# Patient Record
Sex: Male | Born: 1970
Health system: Southern US, Community
[De-identification: ages and names within clinical notes are randomized; demographics above are authoritative.]

## PROBLEM LIST (undated history)

## (undated) DIAGNOSIS — M1990 Unspecified osteoarthritis, unspecified site: Secondary | ICD-10-CM

## (undated) DIAGNOSIS — E785 Hyperlipidemia, unspecified: Secondary | ICD-10-CM

## (undated) DIAGNOSIS — E739 Lactose intolerance, unspecified: Secondary | ICD-10-CM

## (undated) DIAGNOSIS — R7401 Elevation of levels of liver transaminase levels: Secondary | ICD-10-CM

## (undated) DIAGNOSIS — M2550 Pain in unspecified joint: Secondary | ICD-10-CM

## (undated) DIAGNOSIS — J302 Other seasonal allergic rhinitis: Secondary | ICD-10-CM

## (undated) DIAGNOSIS — K219 Gastro-esophageal reflux disease without esophagitis: Secondary | ICD-10-CM

## (undated) DIAGNOSIS — K760 Fatty (change of) liver, not elsewhere classified: Secondary | ICD-10-CM

## (undated) DIAGNOSIS — R740 Nonspecific elevation of levels of transaminase and lactic acid dehydrogenase [LDH]: Secondary | ICD-10-CM

## (undated) DIAGNOSIS — I10 Essential (primary) hypertension: Secondary | ICD-10-CM

## (undated) HISTORY — DX: Essential (primary) hypertension: I10

## (undated) HISTORY — DX: Pain in unspecified joint: M25.50

## (undated) HISTORY — DX: Unspecified osteoarthritis, unspecified site: M19.90

## (undated) HISTORY — DX: Elevation of levels of liver transaminase levels: R74.01

## (undated) HISTORY — DX: Fatty (change of) liver, not elsewhere classified: K76.0

## (undated) HISTORY — PX: ROTATOR CUFF REPAIR: SHX139

## (undated) HISTORY — DX: Nonspecific elevation of levels of transaminase and lactic acid dehydrogenase (ldh): R74.0

## (undated) HISTORY — DX: Gastro-esophageal reflux disease without esophagitis: K21.9

## (undated) HISTORY — PX: HERNIA REPAIR: SHX51

## (undated) HISTORY — DX: Lactose intolerance, unspecified: E73.9

## (undated) HISTORY — PX: KNEE ARTHROSCOPY: SHX127

---

## 2011-05-01 ENCOUNTER — Encounter: Payer: Self-pay | Admitting: *Deleted

## 2011-05-01 ENCOUNTER — Emergency Department
Admission: EM | Admit: 2011-05-01 | Discharge: 2011-05-01 | Disposition: A | Discharge status: Home / Self Care | Payer: BC Managed Care – PPO | Source: Home / Self Care

## 2011-05-01 DIAGNOSIS — K148 Other diseases of tongue: Secondary | ICD-10-CM

## 2011-05-01 DIAGNOSIS — H579 Unspecified disorder of eye and adnexa: Secondary | ICD-10-CM

## 2011-05-01 DIAGNOSIS — L989 Disorder of the skin and subcutaneous tissue, unspecified: Secondary | ICD-10-CM

## 2011-05-01 HISTORY — DX: Hyperlipidemia, unspecified: E78.5

## 2011-05-01 NOTE — ED Notes (Signed)
Pt c/o bump beside his LT eye and on the RT side of his tongue x 3 wks. He has applied heat with no relief. Denies fever.

## 2011-05-01 NOTE — ED Provider Notes (Signed)
History     CSN: [id number]  Arrival date & time 05/01/11  1126   None     Chief Complaint  Patient presents with  . Mass    (Consider location/radiation/quality/duration/timing/severity/associated sxs/prior treatment) HPI This patient presents with 2 problems.  The first is a bump on the medial aspect of his left eye.  It has been there for a few weeks.  No known trauma or irritation.  He used to heating compress which did help but then did not get any better after that.  No difficulty seeing.  No blurry vision no headaches.  No foreign bodies.  No pain.  Second problem is a bump on the right tong plaster weeks.  He does not recall eating anything hot or damaging it.  He states that he made good the area.  He is not a smoker.  It is sticking out to the right tongue and that is causing him to buy further.  He does report that is a little bit sore and irritated but is not bleeding and no pus.  No previous trauma to either area.  Past Medical History  Diagnosis Date  . Hyperlipidemia     Past Surgical History  Procedure Date  . Knee arthroscopy   . Hernia repair     Family History  Problem Relation Age of Onset  . Cancer Father     prostate    History  Substance Use Topics  . Smoking status: Never Smoker   . Smokeless tobacco: Not on file  . Alcohol Use: Yes      Review of Systems  All other systems reviewed and are negative.    Allergies  Review of patient's allergies indicates no known allergies.  Home Medications  No current outpatient prescriptions on file.  BP 136/86  Pulse 95  Temp(Src) 98.7 F (37.1 C) (Oral)  Resp 18  Ht 6\' 3"  (1.905 m)  Wt 249 lb 8 oz (113.172 kg)  BMI 31.19 kg/m2  SpO2 96%  Physical Exam  Nursing note and vitals reviewed. Constitutional: He is oriented to person, place, and time. He appears well-developed and well-nourished.  HENT:  Head: Normocephalic and atraumatic.  Mouth/Throat:    Eyes: No scleral icterus.     Neck: Neck supple.  Cardiovascular: Regular rhythm and normal heart sounds.   Pulmonary/Chest: Effort normal and breath sounds normal. No respiratory distress.  Neurological: He is alert and oriented to person, place, and time.  Skin: Skin is warm and dry.  Psychiatric: He has a normal mood and affect. His speech is normal.    ED Course  Procedures (including critical care time)  Labs Reviewed - No data to display No results found.   1. Eye lesion   2. Tongue lesion       MDM   This patient has 2 different problems.  The first problem is the small lump on the inner canthal area of his left eye.  It looks to be a very small cyst originating from the conjunctiva.  It does not appear to be a blocked tear duct.  It does not look malignant.  However since I'm unsure what this is, I have suggested that he do warm compresses in the meantime.  We're also going to try to get him in to see either oculoplastics or an ophthalmologist to have them look at it and consider lancing it.  In addition to that he also has a lump on the right side of his tongue there is  probably scar tissue from repeatedly biting it or from a burn.  It does not look to be cancerous.  However if the same doctor can look at this that would be helpful so we will first try to get him to see oculoplastic surgery if not then he will need to see ENT to have this evaluated and perhaps removed.  Another option would be to watch it for the next week or 2 and try to avoid biting it and see if it went away and if not then he went to see ENT at that time.  At this time I do not believe that we can do anything here because of the locations of the lesions.  Marlaine Hind, MD 05/01/11 1230

## 2013-01-01 ENCOUNTER — Emergency Department
Admission: EM | Admit: 2013-01-01 | Discharge: 2013-01-01 | Disposition: A | Discharge status: Home / Self Care | Payer: Self-pay | Source: Home / Self Care | Attending: Family Medicine | Admitting: Family Medicine

## 2013-01-01 ENCOUNTER — Encounter: Payer: Self-pay | Admitting: Emergency Medicine

## 2013-01-01 ENCOUNTER — Emergency Department
Admission: EM | Admit: 2013-01-01 | Discharge: 2013-01-01 | Disposition: A | Discharge status: Home / Self Care | Payer: BC Managed Care – PPO | Source: Home / Self Care | Attending: Family Medicine | Admitting: Family Medicine

## 2013-01-01 DIAGNOSIS — K649 Unspecified hemorrhoids: Secondary | ICD-10-CM

## 2013-01-01 MED ORDER — HYDROCORTISONE ACETATE 25 MG RE SUPP: 25.0000 mg | Freq: Two times a day (BID) | RECTAL | Status: DC

## 2013-01-01 NOTE — ED Notes (Signed)
Jerry Escobar complains of a hemorrhoid for 1 week. He has tried preparation H with little relief. Area feels swollen per patient.

## 2013-01-01 NOTE — ED Provider Notes (Signed)
CSN: [id number]     Arrival date & time 01/01/13  1427 History   First MD Initiated Contact with Patient 01/01/13 1438     Chief Complaint  Patient presents with  . Hemorrhoids    x 1 week    HPI Hemorrhoids x 1 week Has had anal and irritation Has been eating more meats and decreased fiber.  No bloody stools.   No prior hx/o hemorrhoids in the past.  Has been using OTC preparation H with mild improvement in sxs.    Past Medical History  Diagnosis Date  . Hyperlipidemia    Past Surgical History  Procedure Laterality Date  . Knee arthroscopy    . Hernia repair     Family History  Problem Relation Age of Onset  . Cancer Father     prostate   History  Substance Use Topics  . Smoking status: Never Smoker   . Smokeless tobacco: Not on file  . Alcohol Use: Yes    Review of Systems  All other systems reviewed and are negative.    Allergies  Review of patient's allergies indicates no known allergies.  Home Medications  No current outpatient prescriptions on file. BP 145/89  Pulse 78  Temp(Src) 97.9 F (36.6 C) (Oral)  Ht 6\' 3"  (1.905 m)  Wt 270 lb (122.471 kg)  BMI 33.75 kg/m2  SpO2 95% Physical Exam  Constitutional: He appears well-developed and well-nourished.  HENT:  Head: Normocephalic and atraumatic.  Eyes: Conjunctivae are normal. Pupils are equal, round, and reactive to light.  Neck: Normal range of motion. Neck supple.  Cardiovascular: Normal rate and regular rhythm.   Pulmonary/Chest: Effort normal and breath sounds normal.  Abdominal: Soft.  Genitourinary:     + 2x2 cm nonthrombosed external hemorrhoid  Neurological: He is alert.  Skin: Skin is warm.    ED Course  Procedures (including critical care time) Labs Review Labs Reviewed - No data to display Imaging Review No results found.  EKG Interpretation    Date/Time:    Ventricular Rate:    PR Interval:    QRS Duration:   QT Interval:    QTC Calculation:   R Axis:     Text  Interpretation:              MDM   1. Hemorrhoids    Will treat with anusol  Handout given Discussed high fiber diet.  Discussed general care and infectious/GI red flags Follow up as needed.    The patient and/or caregiver has been counseled thoroughly with regard to treatment plan and/or medications prescribed including dosage, schedule, interactions, rationale for use, and possible side effects and they verbalize understanding. Diagnoses and expected course of recovery discussed and will return if not improved as expected or if the condition worsens. Patient and/or caregiver verbalized understanding.         Doree Albee, MD 01/01/13 [phone]

## 2013-02-11 ENCOUNTER — Encounter: Payer: Self-pay | Admitting: Emergency Medicine

## 2013-02-11 ENCOUNTER — Emergency Department
Admission: EM | Admit: 2013-02-11 | Discharge: 2013-02-11 | Disposition: A | Discharge status: Home / Self Care | Payer: BC Managed Care – PPO | Source: Home / Self Care | Attending: Family Medicine | Admitting: Family Medicine

## 2013-02-11 DIAGNOSIS — R300 Dysuria: Secondary | ICD-10-CM

## 2013-02-11 LAB — POCT URINALYSIS DIP (MANUAL ENTRY)
Bilirubin, UA: NEGATIVE
GLUCOSE UA: NEGATIVE
Ketones, POC UA: NEGATIVE
Leukocytes, UA: NEGATIVE
Nitrite, UA: NEGATIVE
PH UA: 6 (ref 5–8)
Protein Ur, POC: NEGATIVE
Spec Grav, UA: >=1.03 (ref 1.005–1.03)
UROBILINOGEN UA: 1 (ref 0–1)

## 2013-02-11 MED ORDER — DOXYCYCLINE HYCLATE 100 MG PO CAPS: 100.0000 mg | ORAL_CAPSULE | Freq: Two times a day (BID) | ORAL | Status: DC

## 2013-02-11 NOTE — ED Provider Notes (Signed)
CSN: [id number]     Arrival date & time 02/11/13  1233 History   First MD Initiated Contact with Patient 02/11/13 1329     Chief Complaint  Patient presents with  . Dysuria     HPI Comments: Patient developed onset of dysuria yesterday without frequency or urgency.  No nocturia or urethral discharge.  No testicular pain or swelling.  No rash.  No fevers, chills, and sweats.  He feels well otherwise.  Patient is a 43 y.o. male presenting with dysuria. The history is provided by the patient.  Dysuria This is a new problem. The current episode started yesterday. The problem occurs constantly. The problem has not changed since onset.Associated symptoms comments: none. Nothing aggravates the symptoms. Nothing relieves the symptoms. He has tried nothing for the symptoms.    Past Medical History  Diagnosis Date  . Hyperlipidemia    Past Surgical History  Procedure Laterality Date  . Knee arthroscopy    . Hernia repair     Family History  Problem Relation Age of Onset  . Cancer Father     prostate   History  Substance Use Topics  . Smoking status: Never Smoker   . Smokeless tobacco: Not on file  . Alcohol Use: Yes    Review of Systems  Genitourinary: Positive for dysuria. Negative for urgency, frequency, hematuria, flank pain, decreased urine volume, discharge, scrotal swelling, difficulty urinating, genital sores, penile pain and testicular pain.  All other systems reviewed and are negative.    Allergies  Review of patient's allergies indicates no known allergies.  Home Medications   Current Outpatient Rx  Name  Route  Sig  Dispense  Refill  . doxycycline (VIBRAMYCIN) 100 MG capsule   Oral   Take 1 capsule (100 mg total) by mouth 2 (two) times daily.   14 capsule   0    BP 130/91  Pulse 86  Temp(Src) 98.6 F (37 C) (Oral)  Resp 16  Ht 6' 3"  (1.905 m)  Wt 268 lb 12 oz (121.904 kg)  BMI 33.59 kg/m2  SpO2 96% Physical Exam Nursing notes and Vital Signs  reviewed. Appearance:  Patient appears stated age, and in no acute distress.  Patient is obese (BMI 33.6) Eyes:  Pupils are equal, round, and reactive to light and accomodation.  Extraocular movement is intact.  Conjunctivae are not inflamed .  Pharynx:  Normal Neck:  Supple.  No adenopathy Lungs:  Clear to auscultation.  Breath sounds are equal.  Heart:  Regular rate and rhythm without murmurs, rubs, or gallops.  Abdomen:  Nontender without masses or hepatosplenomegaly.  Bowel sounds are present.  No CVA or flank tenderness.  Extremities:  No edema.  Skin:  No rash present.  Genitourinary:  Penis normal without lesions or urethral discharge.  Scrotum is normal.  Testes are descended bilaterally without nodules or tenderness.  No regional lymphadenopathy palpated       ED Course  Procedures  none    Labs Reviewed  URINE CULTURE  GC/CHLAMYDIA PROBE AMP, URINE  POCT URINALYSIS DIP (MANUAL ENTRY):  SG >= 1.030; BLO Trace intact, otherwise negative         MDM   1. Dysuria    GC/chlamydia and urine culture pending. Begin empiric doxycycline Increase fluid intake. Followup with Family Doctor if not improved in about 5 or 6 days.    Kandra Nicolas, MD 02/11/13 [phone]

## 2013-02-11 NOTE — ED Notes (Signed)
Patient c/o dysuria since yesterday.

## 2013-02-11 NOTE — Discharge Instructions (Signed)
Increase fluid intake.   Dysuria Dysuria is the medical term for pain with urination. There are many causes for dysuria, but urinary tract infection is the most common. If a urinalysis was performed it can show that there is a urinary tract infection. A urine culture confirms that you or your child is sick. You will need to follow up with a healthcare provider because:  If a urine culture was done you will need to know the culture results and treatment recommendations.  If the urine culture was positive, you or your child will need to be put on antibiotics or know if the antibiotics prescribed are the right antibiotics for your urinary tract infection.  If the urine culture is negative (no urinary tract infection), then other causes may need to be explored or antibiotics need to be stopped. Today laboratory work may have been done and there does not seem to be an infection. If cultures were done they will take at least 24 to 48 hours to be completed. Today x-rays may have been taken and they read as normal. No cause can be found for the problems. The x-rays may be re-read by a radiologist and you will be contacted if additional findings are made. You or your child may have been put on medications to help with this problem until you can see your primary caregiver. If the problems get better, see your primary caregiver if the problems return. If you were given antibiotics (medications which kill germs), take all of the mediations as directed for the full course of treatment.  If laboratory work was done, you need to find the results. Leave a telephone number where you can be reached. If this is not possible, make sure you find out how you are to get test results. HOME CARE INSTRUCTIONS   Drink lots of fluids. For adults, drink eight, 8 ounce glasses of clear juice or water a day. For children, replace fluids as suggested by your caregiver.  Empty the bladder often. Avoid holding urine for long  periods of time.  After a bowel movement, women should cleanse front to back, using each tissue only once.  Empty your bladder before and after sexual intercourse.  Take all the medicine given to you until it is gone. You may feel better in a few days, but TAKE ALL MEDICINE.  Avoid caffeine, tea, alcohol and carbonated beverages, because they tend to irritate the bladder.  In men, alcohol may irritate the prostate.  Only take over-the-counter or prescription medicines for pain, discomfort, or fever as directed by your caregiver.  If your caregiver has given you a follow-up appointment, it is very important to keep that appointment. Not keeping the appointment could result in a chronic or permanent injury, pain, and disability. If there is any problem keeping the appointment, you must call back to this facility for assistance. SEEK IMMEDIATE MEDICAL CARE IF:   Back pain develops.  A fever develops.  There is nausea (feeling sick to your stomach) or vomiting (throwing up).  Problems are no better with medications or are getting worse. MAKE SURE YOU:   Understand these instructions.  Will watch your condition.  Will get help right away if you are not doing well or get worse. Document Released: 10/04/2003 Document Revised: 03/30/2011 Document Reviewed: 08/11/2007 [facility] Patient Information 2014 [employer].

## 2013-02-12 LAB — URINE CULTURE
Colony Count: NO GROWTH
ORGANISM ID, BACTERIA: NO GROWTH

## 2013-02-13 LAB — GC/CHLAMYDIA PROBE AMP, URINE
Chlamydia, Swab/Urine, PCR: NEGATIVE
GC Probe Amp, Urine: NEGATIVE

## 2013-02-14 ENCOUNTER — Telehealth: Payer: Self-pay | Admitting: *Deleted

## 2013-04-09 ENCOUNTER — Encounter: Payer: Self-pay | Admitting: Emergency Medicine

## 2013-04-09 ENCOUNTER — Emergency Department
Admission: EM | Admit: 2013-04-09 | Discharge: 2013-04-09 | Disposition: A | Discharge status: Home / Self Care | Payer: BC Managed Care – PPO | Source: Home / Self Care

## 2013-04-09 DIAGNOSIS — J111 Influenza due to unidentified influenza virus with other respiratory manifestations: Secondary | ICD-10-CM

## 2013-04-09 DIAGNOSIS — J209 Acute bronchitis, unspecified: Secondary | ICD-10-CM

## 2013-04-09 MED ORDER — OSELTAMIVIR PHOSPHATE 75 MG PO CAPS
ORAL_CAPSULE | ORAL | Status: DC
Start: 2013-04-09 — End: 2013-06-29

## 2013-04-09 MED ORDER — AZITHROMYCIN 250 MG PO TABS: ORAL_TABLET | ORAL | Status: DC

## 2013-04-09 NOTE — ED Notes (Signed)
Patient c/o fever, sweats, headaches, sinus pressure and cough since yesterday. Has tried otc Dayquil and Alkaselzer cold without relief.

## 2013-04-09 NOTE — ED Provider Notes (Signed)
CSN: [id number]     Arrival date & time 04/09/13  1426 History   None    Chief Complaint  Patient presents with  . Fever  . Headache  . Sinusitis   (Consider location/radiation/quality/duration/timing/severity/associated sxs/prior Treatment)  HPI FLU  HPI : Flu symptoms for about 1 day. Fever to 102 with chills, sweats, myalgias, fatigue, headache. Symptoms are progressively worsening, despite trying OTC fever reducing medicine and rest and fluids. Has decreased appetite, but tolerating some liquids by mouth. No history of recent tick bite.  Review of Systems: Positive for fatigue, mild nasal congestion, mild sore throat, mild swollen anterior neck glands, + cough and chest congestion, cough productive of discolored sputum Negative for acute vision changes, stiff neck, focal weakness, syncope, seizures, respiratory distress, vomiting, diarrhea, GU symptoms, new Rash.  Past Medical History  Diagnosis Date  . Hyperlipidemia    Past Surgical History  Procedure Laterality Date  . Knee arthroscopy    . Hernia repair     Family History  Problem Relation Age of Onset  . Cancer Father     prostate   History  Substance Use Topics  . Smoking status: Never Smoker   . Smokeless tobacco: Not on file  . Alcohol Use: Yes    Review of Systems  All other systems reviewed and are negative.    Allergies  Review of patient's allergies indicates no known allergies.  Home Medications   Current Outpatient Rx  Name  Route  Sig  Dispense  Refill  . azithromycin (ZITHROMAX Z-PAK) 250 MG tablet      Take 2 tablets on day one, then 1 tablet daily on days 2 through 5   1 each   0   . doxycycline (VIBRAMYCIN) 100 MG capsule   Oral   Take 1 capsule (100 mg total) by mouth 2 (two) times daily.   14 capsule   0   . oseltamivir (TAMIFLU) 75 MG capsule      Starting today, take 1 capsule by mouth twice a day for 5 days.   10 capsule   0    BP 151/91  Pulse 92  Temp(Src) 98.5 F  (36.9 C) (Oral)  Resp 18  Ht 6' 3"  (1.905 m)  Wt 262 lb (118.842 kg)  BMI 32.75 kg/m2  SpO2 97% Physical Exam  Nursing note and vitals reviewed. Constitutional: He appears well-developed and well-nourished.  Non-toxic appearance. He appears ill (very fatigued, but no cardiorespiratory distress). No distress.  HENT:  Head: Normocephalic and atraumatic.  Right Ear: Tympanic membrane and external ear normal.  Left Ear: Tympanic membrane and external ear normal.  Nose: Rhinorrhea present.  Mouth/Throat: Mucous membranes are normal. Posterior oropharyngeal erythema (mild redness ) present. No oropharyngeal exudate.  Eyes: Conjunctivae are normal. Right eye exhibits no discharge. Left eye exhibits no discharge. No scleral icterus.  Neck: Neck supple.  Cardiovascular: Normal rate, regular rhythm and normal heart sounds.   Pulmonary/Chest: No stridor. No respiratory distress. He has no wheezes. He has no rales.  Diffuse anterior rhonchi. No wheezes  Abdominal: Soft. There is no tenderness.  Musculoskeletal: He exhibits no edema.  Lymphadenopathy:    He has cervical adenopathy (mild shoddy anterior cervical nodes).  Neurological: He is alert.  Skin: Skin is warm and intact. No rash noted. He is diaphoretic.  Psychiatric: He has a normal mood and affect.    ED Course  Procedures (including critical care time) Labs Review Labs Reviewed - No data to display Imaging  Review No results found.   MDM   1. Influenza with respiratory manifestations   2. Acute bronchitis    Clinically, he has classical influenza. Discussed options and he declined flu testing as we both agreed to start Tamiflu Also, clinically he has acute bronchitis with rhonchi and we'll cover with antibiotic for bacterial coverage. Treatment options discussed, as well as risks, benefits, alternatives. Patient voiced understanding and agreement with the following plans: Tamiflu Z-Pak Follow-up with your primary care  doctor in 5-7 days if not improving, or sooner if symptoms become worse. Precautions discussed. Red flags discussed. Questions invited and answered. Patient voiced understanding and agreement.      Jacqulyn Cane, MD 04/09/13 1550

## 2013-06-29 ENCOUNTER — Encounter: Payer: Self-pay | Admitting: Emergency Medicine

## 2013-06-29 ENCOUNTER — Emergency Department
Admission: EM | Admit: 2013-06-29 | Discharge: 2013-06-29 | Disposition: A | Discharge status: Home / Self Care | Payer: BC Managed Care – PPO | Source: Home / Self Care | Attending: Emergency Medicine | Admitting: Emergency Medicine

## 2013-06-29 DIAGNOSIS — H6120 Impacted cerumen, unspecified ear: Secondary | ICD-10-CM

## 2013-06-29 DIAGNOSIS — H6121 Impacted cerumen, right ear: Secondary | ICD-10-CM

## 2013-06-29 NOTE — ED Notes (Signed)
Reports sensation of fullness in right ear for about one week. Denies pain.

## 2013-06-29 NOTE — ED Provider Notes (Signed)
CSN: [id number]     Arrival date & time 06/29/13  2694 History   First MD Initiated Contact with Patient 06/29/13 0827     Chief Complaint  Patient presents with  . Ear Fullness   (Consider location/radiation/quality/duration/timing/severity/associated sxs/prior Treatment) HPI Sensation of fullness in right ear for one week. This started after using Q-tips repeatedly. He has since tried using eardrops without success. Otherwise, denies any ENT symptoms. No sore throat or sinus symptoms or fever or rhinorrhea. No cough or chest pain or shortness of breath. Past Medical History  Diagnosis Date  . Hyperlipidemia    Past Surgical History  Procedure Laterality Date  . Knee arthroscopy    . Hernia repair     Family History  Problem Relation Age of Onset  . Cancer Father     prostate   History  Substance Use Topics  . Smoking status: Never Smoker   . Smokeless tobacco: Not on file  . Alcohol Use: Yes    Review of Systems  All other systems reviewed and are negative.   Allergies  Review of patient's allergies indicates no known allergies.  Home Medications   Prior to Admission medications   Medication Sig Start Date End Date Taking? Authorizing Provider  azithromycin (ZITHROMAX Z-PAK) 250 MG tablet Take 2 tablets on day one, then 1 tablet daily on days 2 through 5 04/09/13   Jacqulyn Cane, MD  doxycycline (VIBRAMYCIN) 100 MG capsule Take 1 capsule (100 mg total) by mouth 2 (two) times daily. 02/11/13   Kandra Nicolas, MD  oseltamivir (TAMIFLU) 75 MG capsule Starting today, take 1 capsule by mouth twice a day for 5 days. 04/09/13   Jacqulyn Cane, MD   BP 131/86  Pulse 82  Temp(Src) 98 F (36.7 C) (Oral)  Resp 16  Ht 6' 3"  (1.905 m)  Wt 267 lb (121.11 kg)  BMI 33.37 kg/m2  SpO2 98% Physical Exam  Nursing note and vitals reviewed. Constitutional: He is oriented to person, place, and time. He appears well-developed and well-nourished. No distress.  HENT:  Head:  Normocephalic and atraumatic.  Nose: Nose normal.  Mouth/Throat: Oropharynx is clear and moist.  Ears: Left ear normal  Right ear: Nontender. There is a dense cerumen impaction. Cannot visualize TM. No drainage or other lesions  Eyes: Conjunctivae and EOM are normal. Pupils are equal, round, and reactive to light. No scleral icterus.  Neck: Normal range of motion.  Cardiovascular: Normal rate.   Pulmonary/Chest: Effort normal.  Abdominal: He exhibits no distension.  Musculoskeletal: Normal range of motion.  Neurological: He is alert and oriented to person, place, and time.  Skin: Skin is warm.  Psychiatric: He has a normal mood and affect.    ED Course  Procedures (including critical care time) Labs Review Labs Reviewed - No data to display  Imaging Review No results found.   MDM   1. Impacted cerumen of right ear     Right ear soaked with peroxide, and irrigated.. A large amount of wax successfully removed, and he felt much better and could hear normally afterward. No complications I then rechecked right ear:  normal external ear, ear canal normal and patent and TM normal. Advice given, advised to avoid Q-tips in the future. Other preventive advice given. Followup with PCP when necessary  Jacqulyn Cane, MD 06/29/13 1002

## 2013-09-18 ENCOUNTER — Ambulatory Visit (INDEPENDENT_AMBULATORY_CARE_PROVIDER_SITE_OTHER): Payer: BC Managed Care – PPO | Admitting: Sports Medicine

## 2013-09-18 ENCOUNTER — Emergency Department (INDEPENDENT_AMBULATORY_CARE_PROVIDER_SITE_OTHER): Payer: BC Managed Care – PPO

## 2013-09-18 ENCOUNTER — Encounter: Payer: Self-pay | Admitting: Emergency Medicine

## 2013-09-18 ENCOUNTER — Emergency Department
Admission: EM | Admit: 2013-09-18 | Discharge: 2013-09-18 | Disposition: A | Discharge status: Home / Self Care | Payer: BC Managed Care – PPO | Source: Home / Self Care | Attending: Family Medicine | Admitting: Family Medicine

## 2013-09-18 DIAGNOSIS — M25552 Pain in left hip: Secondary | ICD-10-CM

## 2013-09-18 DIAGNOSIS — M25559 Pain in unspecified hip: Secondary | ICD-10-CM

## 2013-09-18 DIAGNOSIS — M1612 Unilateral primary osteoarthritis, left hip: Secondary | ICD-10-CM | POA: Insufficient documentation

## 2013-09-18 MED ORDER — DICLOFENAC SODIUM 75 MG PO TBEC: 75.0000 mg | DELAYED_RELEASE_TABLET | Freq: Two times a day (BID) | ORAL | Status: DC

## 2013-09-18 NOTE — ED Provider Notes (Signed)
CSN: [id number]     Arrival date & time 09/18/13  0820 History   First MD Initiated Contact with Patient 09/18/13 [phone]     Chief Complaint  Patient presents with  . Hip Pain      HPI Comments: Patient states that three days ago while walking he developed onset of intermittent pain in his left hip.  He states that his left hip will "pop," and then feel better.  He recalls no trauma, and no recent change in his activities.  His pain is worse when walking, and stretching helps.  Ibuprofen 435m Qhr is helpful.  Patient is a 43y.o. male presenting with hip pain. The history is provided by the patient and the spouse.  Hip Pain This is a new problem. Episode onset: 3 days ago. The problem occurs constantly. The problem has not changed since onset.Associated symptoms comments: none. The symptoms are aggravated by walking. Nothing relieves the symptoms. Treatments tried: Ibuprofen. The treatment provided mild relief.    Past Medical History  Diagnosis Date  . Hyperlipidemia    Past Surgical History  Procedure Laterality Date  . Knee arthroscopy    . Hernia repair    . Rotator cuff repair     Family History  Problem Relation Age of Onset  . Cancer Father     prostate   History  Substance Use Topics  . Smoking status: Never Smoker   . Smokeless tobacco: Not on file  . Alcohol Use: 1.5 oz/week    3 drink(s) per week    Review of Systems  All other systems reviewed and are negative.   Allergies  Review of patient's allergies indicates no known allergies.  Home Medications   Prior to Admission medications   Medication Sig Start Date End Date Taking? Authorizing Provider  Pseudoephedrine-APAP-DM (DAYQUIL MULTI-SYMPTOM PO) Take by mouth.   Yes Historical Provider, MD  diclofenac (VOLTAREN) 75 MG EC tablet Take 1 tablet (75 mg total) by mouth 2 (two) times daily. 09/18/13   TSilverio Decamp MD   BP 154/91  Pulse 91  Temp(Src) 98.5 F (36.9 C) (Oral)  Resp 18  Ht 6' 3"   (1.905 m)  Wt 270 lb (122.471 kg)  BMI 33.75 kg/m2  SpO2 95% Physical Exam  Nursing note and vitals reviewed. Constitutional: He is oriented to person, place, and time. He appears well-developed and well-nourished. No distress.  Patient is obese (BMI 33.8)  HENT:  Head: Normocephalic.  Eyes: Conjunctivae are normal. Pupils are equal, round, and reactive to light.  Cardiovascular: Normal heart sounds.   Pulmonary/Chest: Breath sounds normal.  Abdominal: There is no tenderness.  Musculoskeletal:       Left hip: He exhibits decreased range of motion and tenderness. He exhibits normal strength, no bony tenderness, no swelling and no crepitus.       Legs: Left hip has decreased range of motion to flexion.  There is tenderness to palpation over inguinal ligament, worse with resisted flexion of hip  Neurological: He is alert and oriented to person, place, and time.  Skin: Skin is warm and dry. No rash noted.    ED Course  Procedures  none  Imaging Review Narrative: CLINICAL DATA: Three days of atraumatic left hip pain  EXAM: LEFT HIP - COMPLETE 2+ VIEW  COMPARISON: None.  FINDINGS: AP and frog-leg lateral views of the left hip reveal the joint space to be preserved. The femoral head and neck and intertrochanteric regions are normal. There is linear lucency  through the posterior wall of the acetabulum which may reflect an acute or old fracture. The acetabulum otherwise appears intact. Superior lateral left acetabular osteophytes are suspected. The superior and inferior pubic rami are normal. The visualized portions of the pelvis elsewhere unremarkable. There are mild degenerative changes of the right hip.  IMPRESSION: 1. There is mild degenerative change of the left hip but no acute fracture of the proximal femur is demonstrated. Lucency through the periphery of the posterior wall of the acetabulum is present which may reflect an acute or old fracture. 2. Given the patient's  symptoms, MRI of the hip would be useful if the patient can undergo the procedure.   Electronically Signed By: David Martinique On: 09/18/2013 09:27     MDM   1. Left hip pain    With a radiologic finding of possible fracture in the posterior wall of the left acetabulum, will refer to Dr. Aundria Mems for management and follow-up.    Kandra Nicolas, MD 09/23/13 [phone]

## 2013-09-18 NOTE — Assessment & Plan Note (Signed)
There is a hint of a posterior acetabular injury however without recent or distant trauma pain likely represents osteoarthritis. Diclofenac, formal PT. Return to see me in a few weeks, consider injection if no better. Out of work for one week.

## 2013-09-18 NOTE — ED Notes (Signed)
Onset of intermittent left groin/hip pain while walking x 3 days.  Patient states, "it feels like the hip joint needs to pop."  Denies any known injury.

## 2013-09-18 NOTE — Progress Notes (Signed)
   Subjective:    I'm seeing this patient as a consultation for:  Dr. Assunta Found  CC: Left hip pain  HPI: This is a very pleasant 43 year old male, for the past several weeks he had increasing pain in his left groin without trauma. Pain is moderate, persistent without radiation. I was called for further evaluation and definitive treatment.  Past medical history, Surgical history, Family history not pertinant except as noted below, Social history, Allergies, and medications have been entered into the medical record, reviewed, and no changes needed.   Review of Systems: No headache, visual changes, nausea, vomiting, diarrhea, constipation, dizziness, abdominal pain, skin rash, fevers, chills, night sweats, weight loss, swollen lymph nodes, body aches, joint swelling, muscle aches, chest pain, shortness of breath, mood changes, visual or auditory hallucinations.   Objective:   General: Well Developed, well nourished, and in no acute distress.  Neuro/Psych: Alert and oriented x3, extra-ocular muscles intact, able to move all 4 extremities, sensation grossly intact. Skin: Warm and dry, no rashes noted.  Respiratory: Not using accessory muscles, speaking in full sentences, trachea midline.  Cardiovascular: Pulses palpable, no extremity edema. Abdomen: Does not appear distended. Left Hip: ROM IR: 35 with reproduction of pain in the groin on internal rotation, ER: 60 Deg, Flexion: 120 Deg, Extension: 100 Deg, Abduction: 45 Deg, Adduction: 45 Deg Strength IR: 5/5, ER: 5/5, Flexion: 5/5, Extension: 5/5, Abduction: 5/5, Adduction: 5/5 Pelvic alignment unremarkable to inspection and palpation. Standing hip rotation and gait without trendelenburg / unsteadiness. Greater trochanter without tenderness to palpation. No tenderness over piriformis. No SI joint tenderness and normal minimal SI movement. With his knee extended and hip flexed I counted the bottom of his foot, this did not reproduce any pain in  his hip.  X-rays show arthritis of both hips, there is a suspicion for posterior acetabular fracture however exam and history are not suggestive.  Impression and Recommendations:   This case required medical decision making of moderate complexity.

## 2013-09-23 NOTE — Discharge Instructions (Signed)
Hip Pain Your hip is the joint between your upper legs and your lower pelvis. The bones, cartilage, tendons, and muscles of your hip joint perform a lot of work each day supporting your body weight and allowing you to move around. Hip pain can range from a minor ache to severe pain in one or both of your hips. Pain may be felt on the inside of the hip joint near the groin, or the outside near the buttocks and upper thigh. You may have swelling or stiffness as well.  HOME CARE INSTRUCTIONS   Take medicines only as directed by your health care provider.  Apply ice to the injured area:  Put ice in a plastic bag.  Place a towel between your skin and the bag.  Leave the ice on for 15-20 minutes at a time, 3-4 times a day.  Keep your leg raised (elevated) when possible to lessen swelling.  Avoid activities that cause pain.  Follow specific exercises as directed by your health care provider.  Sleep with a pillow between your legs on your most comfortable side.  Record how often you have hip pain, the location of the pain, and what it feels like. SEEK MEDICAL CARE IF:   You are unable to put weight on your leg.  Your hip is red or swollen or very tender to touch.  Your pain or swelling continues or worsens after 1 week.  You have increasing difficulty walking.  You have a fever. SEEK IMMEDIATE MEDICAL CARE IF:   You have fallen.  You have a sudden increase in pain and swelling in your hip. MAKE SURE YOU:   Understand these instructions.  Will watch your condition.  Will get help right away if you are not doing well or get worse. Document Released: 06/25/2009 Document Revised: 05/22/2013 Document Reviewed: 09/01/2012 Exit[employer] Patient Information 2015 [employer]. This information is not intended to replace advice given to you by your health care provider. Make sure you discuss any questions you have with your health care provider.  

## 2013-09-28 ENCOUNTER — Ambulatory Visit (INDEPENDENT_AMBULATORY_CARE_PROVIDER_SITE_OTHER): Payer: BC Managed Care – PPO | Admitting: Physical Therapy

## 2013-09-28 ENCOUNTER — Telehealth: Payer: Self-pay | Admitting: Sports Medicine

## 2013-09-28 DIAGNOSIS — M2560 Stiffness of unspecified joint, not elsewhere classified: Secondary | ICD-10-CM

## 2013-09-28 DIAGNOSIS — R5381 Other malaise: Secondary | ICD-10-CM

## 2013-09-28 DIAGNOSIS — M25559 Pain in unspecified hip: Secondary | ICD-10-CM

## 2013-09-28 DIAGNOSIS — R269 Unspecified abnormalities of gait and mobility: Secondary | ICD-10-CM

## 2013-09-28 NOTE — Telephone Encounter (Signed)
Patient walked-in advised that you have wrote him out of work for 1 week til Monday 09/25/13 and he has gone to physical therapy but not better and he needs an extention out of work for this week Tuesday 09/26/13--09/29/13 and next week  10/02/13-10/06/13. I advised I would send a phone note please advise patient about extended work note if and when he can pick up. [phone] is the patient's best contact number.

## 2013-09-28 NOTE — Telephone Encounter (Signed)
Please see note below. Rhonda Cunningham,CMA

## 2013-10-02 ENCOUNTER — Ambulatory Visit (INDEPENDENT_AMBULATORY_CARE_PROVIDER_SITE_OTHER): Payer: BC Managed Care – PPO | Admitting: Sports Medicine

## 2013-10-02 ENCOUNTER — Encounter: Payer: Self-pay | Admitting: Sports Medicine

## 2013-10-02 VITALS — BP 139/88 | HR 73 | Wt 273.0 lb

## 2013-10-02 DIAGNOSIS — M1612 Unilateral primary osteoarthritis, left hip: Secondary | ICD-10-CM

## 2013-10-02 DIAGNOSIS — M1610 Unilateral primary osteoarthritis, unspecified hip: Secondary | ICD-10-CM | POA: Diagnosis not present

## 2013-10-02 NOTE — Progress Notes (Signed)
  Subjective:    CC: Followup  HPI: Left hip pain: Clinically diagnosed osteoarthritis, confirmed on x-ray, overall only had one session of physical therapy and only feel slightly better with oral NSAIDs. Pain is moderate, persistent and continues to be localized in the groin, without radiation, worse with weightbearing.  Past medical history, Surgical history, Family history not pertinant except as noted below, Social history, Allergies, and medications have been entered into the medical record, reviewed, and no changes needed.   Review of Systems: No fevers, chills, night sweats, weight loss, chest pain, or shortness of breath.   Objective:    General: Well Developed, well nourished, and in no acute distress.  Neuro: Alert and oriented x3, extra-ocular muscles intact, sensation grossly intact.  HEENT: Normocephalic, atraumatic, pupils equal round reactive to light, neck supple, no masses, no lymphadenopathy, thyroid nonpalpable.  Skin: Warm and dry, no rashes. Cardiac: Regular rate and rhythm, no murmurs rubs or gallops, no lower extremity edema.  Respiratory: Clear to auscultation bilaterally. Not using accessory muscles, speaking in full sentences.  Procedure: Real-time Ultrasound Guided Injection of left femoral acetabular joint Device: GE Logiq E  Verbal informed consent obtained.  Time-out conducted.  Noted no overlying erythema, induration, or other signs of local infection.  Skin prepped in a sterile fashion.  Local anesthesia: Topical Ethyl chloride.  With sterile technique and under real time ultrasound guidance:  Spinal needle is advanced up to the femoral head/neck junction, 2 cc kenalog 40, 4 cc lidocaine injected easily. Completed without difficulty  Pain immediately resolved suggesting accurate placement of the medication.  Advised to call if fevers/chills, erythema, induration, drainage, or persistent bleeding.  Images permanently stored and available for review in the  ultrasound unit.  Impression: Technically successful ultrasound guided injection.  Impression and Recommendations:

## 2013-10-02 NOTE — Telephone Encounter (Signed)
Taken care of in the office.

## 2013-10-02 NOTE — Assessment & Plan Note (Signed)
Persistent pain in the left hip, only had a single session of physical therapy and diclofenac, slightly better. Left femoral acetabular joint injection as above. Continue PT, return in a month. Out of work until next assessment.

## 2013-10-03 ENCOUNTER — Encounter (INDEPENDENT_AMBULATORY_CARE_PROVIDER_SITE_OTHER): Payer: BC Managed Care – PPO | Admitting: Physical Therapy

## 2013-10-03 DIAGNOSIS — R5381 Other malaise: Secondary | ICD-10-CM

## 2013-10-03 DIAGNOSIS — M2560 Stiffness of unspecified joint, not elsewhere classified: Secondary | ICD-10-CM

## 2013-10-03 DIAGNOSIS — R269 Unspecified abnormalities of gait and mobility: Secondary | ICD-10-CM

## 2013-10-03 DIAGNOSIS — M25559 Pain in unspecified hip: Secondary | ICD-10-CM

## 2013-10-04 ENCOUNTER — Encounter (INDEPENDENT_AMBULATORY_CARE_PROVIDER_SITE_OTHER): Payer: BC Managed Care – PPO | Admitting: Physical Therapy

## 2013-10-04 DIAGNOSIS — M25559 Pain in unspecified hip: Secondary | ICD-10-CM

## 2013-10-04 DIAGNOSIS — R5381 Other malaise: Secondary | ICD-10-CM

## 2013-10-04 DIAGNOSIS — R269 Unspecified abnormalities of gait and mobility: Secondary | ICD-10-CM

## 2013-10-04 DIAGNOSIS — M2560 Stiffness of unspecified joint, not elsewhere classified: Secondary | ICD-10-CM

## 2013-10-10 ENCOUNTER — Encounter (INDEPENDENT_AMBULATORY_CARE_PROVIDER_SITE_OTHER): Payer: BC Managed Care – PPO | Admitting: Physical Therapy

## 2013-10-10 DIAGNOSIS — R269 Unspecified abnormalities of gait and mobility: Secondary | ICD-10-CM

## 2013-10-10 DIAGNOSIS — M25559 Pain in unspecified hip: Secondary | ICD-10-CM

## 2013-10-10 DIAGNOSIS — M2560 Stiffness of unspecified joint, not elsewhere classified: Secondary | ICD-10-CM

## 2013-10-10 DIAGNOSIS — R5381 Other malaise: Secondary | ICD-10-CM

## 2013-10-12 ENCOUNTER — Encounter: Payer: BC Managed Care – PPO | Admitting: Physical Therapy

## 2013-10-30 ENCOUNTER — Encounter: Payer: Self-pay | Admitting: Sports Medicine

## 2013-10-30 ENCOUNTER — Ambulatory Visit (INDEPENDENT_AMBULATORY_CARE_PROVIDER_SITE_OTHER): Payer: BC Managed Care – PPO | Admitting: Sports Medicine

## 2013-10-30 VITALS — BP 152/94 | HR 86 | Ht 75.0 in | Wt 272.0 lb

## 2013-10-30 DIAGNOSIS — M25552 Pain in left hip: Secondary | ICD-10-CM | POA: Diagnosis not present

## 2013-10-30 MED ORDER — DICLOFENAC SODIUM 75 MG PO TBEC: 75.0000 mg | DELAYED_RELEASE_TABLET | Freq: Two times a day (BID) | ORAL | Status: DC

## 2013-10-30 NOTE — Progress Notes (Signed)
  Subjective:    CC: Followup  HPI: Left hip osteoarthritis : was doing well, currently in physical therapy, took a misstep, now has recurrence of pain in the groin, moderate, persistent without radiation, approximately 60% of what it was before.  Past medical history, Surgical history, Family history not pertinant except as noted below, Social history, Allergies, and medications have been entered into the medical record, reviewed, and no changes needed.   Review of Systems: No fevers, chills, night sweats, weight loss, chest pain, or shortness of breath.   Objective:    General: Well Developed, well nourished, and in no acute distress.  Neuro: Alert and oriented x3, extra-ocular muscles intact, sensation grossly intact.  HEENT: Normocephalic, atraumatic, pupils equal round reactive to light, neck supple, no masses, no lymphadenopathy, thyroid nonpalpable.  Skin: Warm and dry, no rashes. Cardiac: Regular rate and rhythm, no murmurs rubs or gallops, no lower extremity edema.  Respiratory: Clear to auscultation bilaterally. Not using accessory muscles, speaking in full sentences.  Procedure: Real-time Ultrasound Guided Injection of left femoral acetabular joint Device: GE Logiq E  Verbal informed consent obtained.  Time-out conducted.  Noted no overlying erythema, induration, or other signs of local infection.  Skin prepped in a sterile fashion.  Local anesthesia: Topical Ethyl chloride.  With sterile technique and under real time ultrasound guidance:  Spinal needle advanced to the femoral head/neck junction, 2 cc kenalog 40 , 4 cc lidocaine injected easily. Completed without difficulty  Pain immediately resolved suggesting accurate placement of the medication.  Advised to call if fevers/chills, erythema, induration, drainage, or persistent bleeding.  Images permanently stored and available for review in the ultrasound unit.  Impression: Technically successful ultrasound guided  injection.  Impression and Recommendations:

## 2013-10-30 NOTE — Assessment & Plan Note (Signed)
Was doing extremely well with physical therapy and previous injection one month ago, took a misstep and has a partial recurrence of pain. Repeat injection, return in one month. Out of work. If continues to have pain we can consider starting viscous supplementation versus advanced imaging.

## 2013-11-27 ENCOUNTER — Encounter: Payer: Self-pay | Admitting: Sports Medicine

## 2013-11-27 ENCOUNTER — Ambulatory Visit (INDEPENDENT_AMBULATORY_CARE_PROVIDER_SITE_OTHER): Payer: BC Managed Care – PPO | Admitting: Sports Medicine

## 2013-11-27 VITALS — BP 144/93 | HR 79 | Ht 75.0 in | Wt 274.0 lb

## 2013-11-27 DIAGNOSIS — M1612 Unilateral primary osteoarthritis, left hip: Secondary | ICD-10-CM

## 2013-11-27 DIAGNOSIS — G5711 Meralgia paresthetica, right lower limb: Secondary | ICD-10-CM

## 2013-11-27 MED ORDER — AMITRIPTYLINE HCL 50 MG PO TABS: ORAL_TABLET | ORAL | Status: DC

## 2013-11-27 NOTE — Assessment & Plan Note (Signed)
Loose fitting clothing. Amitriptyline at bedtime. Return in 6 weeks, ultrasound-guided hydrodissection of the lateral femoral cutaneous nerve at it's course medial to the ASIS and over the sartorius if no better.

## 2013-11-27 NOTE — Progress Notes (Signed)
  Subjective:    CC: follow-up  HPI: Left hip osteoarthritis: Pain-free now after physical therapy and 2 injections. Not needing any NSAIDs now.  Right thigh numbness: present for a few months, localized on the right side, anterolateral thigh, no back pain, no radicular symptoms below the leg or to the foot. Able to ride in a car for long periods of time. also with some tenderness at the anterior superior iliac spine. Symptoms are moderate, persistent.  Past medical history, Surgical history, Family history not pertinant except as noted below, Social history, Allergies, and medications have been entered into the medical record, reviewed, and no changes needed.   Review of Systems: No fevers, chills, night sweats, weight loss, chest pain, or shortness of breath.   Objective:    General: Well Developed, well nourished, and in no acute distress.  Neuro: Alert and oriented x3, extra-ocular muscles intact, sensation grossly intact.  HEENT: Normocephalic, atraumatic, pupils equal round reactive to light, neck supple, no masses, no lymphadenopathy, thyroid nonpalpable.  Skin: Warm and dry, no rashes. Cardiac: Regular rate and rhythm, no murmurs rubs or gallops, no lower extremity edema.  Respiratory: Clear to auscultation bilaterally. Not using accessory muscles, speaking in full sentences. Right Hip: ROM IR: 60 Deg, ER: 60 Deg, Flexion: 120 Deg, Extension: 100 Deg, Abduction: 45 Deg, Adduction: 45 Deg Strength IR: 5/5, ER: 5/5, Flexion: 5/5, Extension: 5/5, Abduction: 5/5, Adduction: 5/5 Pelvic alignment unremarkable to inspection and palpation. Standing hip rotation and gait without trendelenburg / unsteadiness. Greater trochanter without tenderness to palpation. Tender to palpation at the anterior superior iliac spine. No tenderness over piriformis. No SI joint tenderness and normal minimal SI movement.  Impression and Recommendations:

## 2013-11-27 NOTE — Assessment & Plan Note (Signed)
Resolved after second injection and with aggressive rehabilitation.

## 2013-11-29 ENCOUNTER — Encounter: Payer: Self-pay | Admitting: Emergency Medicine

## 2013-11-29 ENCOUNTER — Emergency Department (INDEPENDENT_AMBULATORY_CARE_PROVIDER_SITE_OTHER)
Admission: EM | Admit: 2013-11-29 | Discharge: 2013-11-29 | Disposition: A | Discharge status: Home / Self Care | Payer: BC Managed Care – PPO | Source: Home / Self Care | Attending: Emergency Medicine | Admitting: Emergency Medicine

## 2013-11-29 DIAGNOSIS — R059 Cough, unspecified: Secondary | ICD-10-CM

## 2013-11-29 DIAGNOSIS — R05 Cough: Secondary | ICD-10-CM

## 2013-11-29 HISTORY — DX: Other seasonal allergic rhinitis: J30.2

## 2013-11-29 MED ORDER — BENZONATATE 100 MG PO CAPS: 100.0000 mg | ORAL_CAPSULE | Freq: Three times a day (TID) | ORAL | Status: DC

## 2013-11-29 MED ORDER — PREDNISONE 10 MG PO TABS: ORAL_TABLET | ORAL | Status: DC

## 2013-11-29 NOTE — ED Provider Notes (Signed)
CSN: [id number]     Arrival date & time 11/29/13  1508 History   First MD Initiated Contact with Patient 11/29/13 1622     Chief Complaint  Patient presents with  . Cough  . Hoarse   (Consider location/radiation/quality/duration/timing/severity/associated sxs/prior Treatment) Patient is a 43 y.o. male presenting with cough. The history is provided by the patient. No language interpreter was used.  Cough Cough characteristics:  Non-productive Severity:  Moderate Onset quality:  Gradual Duration:  3 weeks Timing:  Constant Progression:  Worsening Chronicity:  New Smoker: no   Relieved by:  Nothing Worsened by:  Nothing tried Ineffective treatments:  None tried Associated symptoms: fever   Associated symptoms: no chest pain, no shortness of breath, no sinus congestion and no sore throat     Past Medical History  Diagnosis Date  . Hyperlipidemia   . Seasonal allergies    Past Surgical History  Procedure Laterality Date  . Knee arthroscopy    . Hernia repair    . Rotator cuff repair     Family History  Problem Relation Age of Onset  . Cancer Father     prostate   History  Substance Use Topics  . Smoking status: Never Smoker   . Smokeless tobacco: Not on file  . Alcohol Use: 1.5 oz/week    3 Not specified per week    Review of Systems  Constitutional: Positive for fever.  HENT: Negative for sore throat.   Respiratory: Positive for cough. Negative for shortness of breath.   Cardiovascular: Negative for chest pain.  All other systems reviewed and are negative.   Allergies  Review of patient's allergies indicates no known allergies.  Home Medications   Prior to Admission medications   Medication Sig Start Date End Date Taking? Authorizing Provider  amitriptyline (ELAVIL) 50 MG tablet One half tab PO qHS for a week, then one tab PO qHS. 11/27/13   Silverio Decamp, MD  benzonatate (TESSALON) 100 MG capsule Take 1 capsule (100 mg total) by mouth every 8  (eight) hours. 11/29/13   Fransico Meadow, PA-C  diclofenac (VOLTAREN) 75 MG EC tablet Take 1 tablet (75 mg total) by mouth 2 (two) times daily. 10/30/13   Silverio Decamp, MD  predniSONE (DELTASONE) 10 MG tablet 6,5,4,3,2,1 taper 11/29/13   Fransico Meadow, PA-C   BP 129/87 mmHg  Pulse 92  Temp(Src) 98 F (36.7 C)  Resp 16  Ht 6' 3"  (1.905 m)  Wt 274 lb (124.286 kg)  BMI 34.25 kg/m2  SpO2 96% Physical Exam  Constitutional: He is oriented to person, place, and time. He appears well-developed and well-nourished.  HENT:  Head: Normocephalic and atraumatic.  Right Ear: External ear normal.  Mouth/Throat: Oropharynx is clear and moist.  Eyes: Conjunctivae and EOM are normal. Pupils are equal, round, and reactive to light.  Neck: Normal range of motion.  Cardiovascular: Normal rate and normal heart sounds.   Pulmonary/Chest: Effort normal and breath sounds normal.  Abdominal: He exhibits no distension.  Musculoskeletal: Normal range of motion.  Neurological: He is alert and oriented to person, place, and time.  Skin: Skin is warm.  Psychiatric: He has a normal mood and affect.  Nursing note and vitals reviewed.   ED Course  Procedures (including critical care time) Labs Review Labs Reviewed - No data to display  Imaging Review No results found.   MDM  I suspect post viral cough.   I will treat with prednisone and tessalon perles  1. Cough     Follow up with Dr. Darene Lamer if any problems.  Tessalon Prednisone taper   Fransico Meadow, PA-C 11/29/13 1656

## 2013-11-29 NOTE — Discharge Instructions (Signed)
Cough, Adult   A cough is a reflex. It helps you clear your throat and airways. A cough can help heal your body. A cough can last 2 or 3 weeks (acute) or may last more than 8 weeks (chronic). Some common causes of a cough can include an infection, allergy, or a cold.  HOME CARE  · Only take medicine as told by your doctor.  · If given, take your medicines (antibiotics) as told. Finish them even if you start to feel better.  · Use a cold steam vaporizer or humidifier in your home. This can help loosen thick spit (secretions).  · Sleep so you are almost sitting up (semi-upright). Use pillows to do this. This helps reduce coughing.  · Rest as needed.  · Stop smoking if you smoke.  GET HELP RIGHT AWAY IF:  · You have yellowish-white fluid (pus) in your thick spit.  · Your cough gets worse.  · Your medicine does not reduce coughing, and you are losing sleep.  · You cough up blood.  · You have trouble breathing.  · Your pain gets worse and medicine does not help.  · You have a fever.  MAKE SURE YOU:   · Understand these instructions.  · Will watch your condition.  · Will get help right away if you are not doing well or get worse.  Document Released: 09/18/2010 Document Revised: 05/22/2013 Document Reviewed: 09/18/2010  [employer]® Patient Information ©2015 [employer]. This information is not intended to replace advice given to you by your health care provider. Make sure you discuss any questions you have with your health care provider.

## 2013-11-29 NOTE — ED Notes (Signed)
Patient reports annoying cough x 3 weeks; intermittently has hoarseness; denies fever denies congestion. Took Thera-Flu about 2 hours ago.

## 2014-01-04 ENCOUNTER — Encounter: Payer: Self-pay | Admitting: *Deleted

## 2014-01-04 ENCOUNTER — Emergency Department
Admission: EM | Admit: 2014-01-04 | Discharge: 2014-01-04 | Disposition: A | Discharge status: Other Acute Inpatient Hospital | Payer: BC Managed Care – PPO | Source: Home / Self Care | Attending: Emergency Medicine | Admitting: Emergency Medicine

## 2014-01-04 DIAGNOSIS — R079 Chest pain, unspecified: Secondary | ICD-10-CM

## 2014-01-04 DIAGNOSIS — R0789 Other chest pain: Secondary | ICD-10-CM

## 2014-01-04 MED ORDER — ASPIRIN 81 MG PO CHEW
324.0000 mg | CHEWABLE_TABLET | Freq: Once | ORAL | Status: AC
  Administered 2014-01-04: 324 mg via ORAL

## 2014-01-04 NOTE — ED Provider Notes (Signed)
CSN: [id number]     Arrival date & time 01/04/14  1512 History   First MD Initiated Contact with Patient 01/04/14 1527     Chief Complaint  Patient presents with  . Chest Pain   (Consider location/radiation/quality/duration/timing/severity/associated sxs/prior Treatment) Patient is a 43 y.o. male presenting with chest pain. The history is provided by the patient. No language interpreter was used.  Chest Pain Pain location:  Substernal area and L chest Pain quality: aching and tightness   Pain radiates to:  Does not radiate Pain radiates to the back: no   Pain severity:  Moderate Onset quality:  Gradual Timing:  Constant Progression:  Worsening Chronicity:  New Context: not breathing   Relieved by:  Nothing Worsened by:  Nothing tried Ineffective treatments:  None tried Associated symptoms: no shortness of breath   Risk factors: no high cholesterol and no hypertension     Past Medical History  Diagnosis Date  . Hyperlipidemia   . Seasonal allergies    Past Surgical History  Procedure Laterality Date  . Knee arthroscopy    . Hernia repair    . Rotator cuff repair     Family History  Problem Relation Age of Onset  . Cancer Father     prostate   History  Substance Use Topics  . Smoking status: Never Smoker   . Smokeless tobacco: Not on file  . Alcohol Use: 1.5 oz/week    3 Not specified per week    Review of Systems  Respiratory: Negative for shortness of breath.   Cardiovascular: Positive for chest pain.  All other systems reviewed and are negative.   Allergies  Review of patient's allergies indicates no known allergies.  Home Medications   Prior to Admission medications   Medication Sig Start Date End Date Taking? Authorizing Provider  amitriptyline (ELAVIL) 50 MG tablet One half tab PO qHS for a week, then one tab PO qHS. 11/27/13   Silverio Decamp, MD  benzonatate (TESSALON) 100 MG capsule Take 1 capsule (100 mg total) by mouth every 8 (eight)  hours. 11/29/13   Fransico Meadow, PA-C  diclofenac (VOLTAREN) 75 MG EC tablet Take 1 tablet (75 mg total) by mouth 2 (two) times daily. 10/30/13   Silverio Decamp, MD  predniSONE (DELTASONE) 10 MG tablet 6,5,4,3,2,1 taper 11/29/13   Hollace Kinnier Janal Haak, PA-C   BP 145/94 mmHg  Pulse 83  Temp(Src) 98.3 F (36.8 C) (Oral)  Resp 18  SpO2 96% Physical Exam  Constitutional: He is oriented to person, place, and time. He appears well-developed and well-nourished.  HENT:  Head: Normocephalic.  Eyes: EOM are normal. Pupils are equal, round, and reactive to light.  Neck: Normal range of motion.  Cardiovascular: Normal rate and regular rhythm.   Pulmonary/Chest: Effort normal and breath sounds normal.  Abdominal: Soft. He exhibits no distension.  Musculoskeletal: Normal range of motion.  Neurological: He is alert and oriented to person, place, and time.  Skin: Skin is warm.  Psychiatric: He has a normal mood and affect.  Nursing note and vitals reviewed.   ED Course  Procedures (including critical care time) Labs Review Labs Reviewed - No data to display  Imaging Review No results found.   MDM  Ekg  No stemi,  105m elevation V2.  Normal sinus.   Pt given asa x 4.   I discuused options for evaluation with pt.   Pt [facility] to go [facility].   I think pt  should have troponins.     1. Chest discomfort        Fransico Meadow, PA-C 01/04/14 1533

## 2014-01-04 NOTE — ED Notes (Signed)
Pt c/o center of his chest tightness with some SOB x 15 minutes ago. He reports being in a "heated argument" at onset. Denies any other s/s.

## 2014-01-04 NOTE — Discharge Instructions (Signed)
Go to [facility] now for evaluation

## 2014-01-08 ENCOUNTER — Encounter: Payer: Self-pay | Admitting: Sports Medicine

## 2014-01-08 ENCOUNTER — Ambulatory Visit (INDEPENDENT_AMBULATORY_CARE_PROVIDER_SITE_OTHER): Payer: BC Managed Care – PPO | Admitting: Sports Medicine

## 2014-01-08 VITALS — BP 138/88 | HR 53 | Ht 75.0 in | Wt 275.0 lb

## 2014-01-08 DIAGNOSIS — G5711 Meralgia paresthetica, right lower limb: Secondary | ICD-10-CM | POA: Diagnosis not present

## 2014-01-08 DIAGNOSIS — M1612 Unilateral primary osteoarthritis, left hip: Secondary | ICD-10-CM

## 2014-01-08 DIAGNOSIS — R079 Chest pain, unspecified: Secondary | ICD-10-CM | POA: Diagnosis not present

## 2014-01-08 NOTE — Assessment & Plan Note (Signed)
Continues to be resolved after injection.

## 2014-01-08 NOTE — Assessment & Plan Note (Signed)
Did have an episode of chest pain, was seen in our urgent care, and sent to the emergency department. MI was ruled out, he has not yet seen a cardiologist or had a stress test. Recommended bringing this up to PCP, for now until he establishes here.

## 2014-01-08 NOTE — Assessment & Plan Note (Signed)
Resolved with improved ergonomics at work and loose fitting clothing.

## 2014-01-08 NOTE — Progress Notes (Signed)
  Subjective:    CC: Follow-up  HPI: Meralgia paresthetica: Improved significantly with wearing loosefitting clothing and changing ergonomics at work.  Left hip osteoarthritis: Continues to be pain free after hip injection.  Chest pain: This was brought up at the end of the visit, he was seen in the emergency department, was ruled out for MI. He has not yet seen a cardiologist or had a stress test. He understands that he needs to follow this up with his PCP.  Past medical history, Surgical history, Family history not pertinant except as noted below, Social history, Allergies, and medications have been entered into the medical record, reviewed, and no changes needed.   Review of Systems: No fevers, chills, night sweats, weight loss, chest pain, or shortness of breath.   Objective:    General: Well Developed, well nourished, and in no acute distress.  Neuro: Alert and oriented x3, extra-ocular muscles intact, sensation grossly intact.  HEENT: Normocephalic, atraumatic, pupils equal round reactive to light, neck supple, no masses, no lymphadenopathy, thyroid nonpalpable.  Skin: Warm and dry, no rashes. Cardiac: Regular rate and rhythm, no murmurs rubs or gallops, no lower extremity edema.  Respiratory: Clear to auscultation bilaterally. Not using accessory muscles, speaking in full sentences.  Impression and Recommendations:

## 2014-01-15 ENCOUNTER — Encounter: Payer: Self-pay | Admitting: Sports Medicine

## 2014-01-15 ENCOUNTER — Ambulatory Visit (INDEPENDENT_AMBULATORY_CARE_PROVIDER_SITE_OTHER): Payer: BC Managed Care – PPO | Admitting: Sports Medicine

## 2014-01-15 VITALS — BP 147/92 | HR 84 | Ht 75.0 in | Wt 274.0 lb

## 2014-01-15 DIAGNOSIS — I10 Essential (primary) hypertension: Secondary | ICD-10-CM | POA: Diagnosis not present

## 2014-01-15 DIAGNOSIS — R079 Chest pain, unspecified: Secondary | ICD-10-CM

## 2014-01-15 DIAGNOSIS — R740 Nonspecific elevation of levels of transaminase and lactic acid dehydrogenase [LDH]: Secondary | ICD-10-CM

## 2014-01-15 DIAGNOSIS — Z0000 Encounter for general adult medical examination without abnormal findings: Secondary | ICD-10-CM

## 2014-01-15 DIAGNOSIS — E669 Obesity, unspecified: Secondary | ICD-10-CM | POA: Insufficient documentation

## 2014-01-15 DIAGNOSIS — E785 Hyperlipidemia, unspecified: Secondary | ICD-10-CM

## 2014-01-15 DIAGNOSIS — R7303 Prediabetes: Secondary | ICD-10-CM

## 2014-01-15 DIAGNOSIS — R7401 Elevation of levels of liver transaminase levels: Secondary | ICD-10-CM

## 2014-01-15 DIAGNOSIS — R7309 Other abnormal glucose: Secondary | ICD-10-CM

## 2014-01-15 LAB — COMPREHENSIVE METABOLIC PANEL
ALT: 95 U/L — ABNORMAL HIGH (ref 0–53)
AST: 47 U/L — ABNORMAL HIGH (ref 0–37)
Albumin: 4.5 g/dL (ref 3.5–5.2)
Alkaline Phosphatase: 56 U/L (ref 39–117)
BUN: 14 mg/dL (ref 6–23)
CO2: 25 mEq/L (ref 19–32)
Calcium: 9.3 mg/dL (ref 8.4–10.5)
Chloride: 103 mEq/L (ref 96–112)
Glucose, Bld: 93 mg/dL (ref 70–99)
Potassium: 4.1 mEq/L (ref 3.5–5.3)

## 2014-01-15 LAB — CBC
HCT: 42.8 % (ref 39.0–52.0)
Hemoglobin: 14.7 g/dL (ref 13.0–17.0)
MCH: 27.9 pg (ref 26.0–34.0)
MCHC: 34.3 g/dL (ref 30.0–36.0)
MCV: 81.2 fL (ref 78.0–100.0)
MPV: 11.1 fL (ref 9.4–12.4)
Platelets: 198 K/uL (ref 150–400)
RBC: 5.27 MIL/uL (ref 4.22–5.81)
RDW: 13.3 % (ref 11.5–15.5)
WBC: 4.9 K/uL (ref 4.0–10.5)

## 2014-01-15 LAB — LIPID PANEL
Cholesterol: 229 mg/dL — ABNORMAL HIGH (ref 0–200)
HDL: 39 mg/dL — ABNORMAL LOW (ref >39)
LDL Cholesterol: 156 mg/dL — ABNORMAL HIGH (ref 0–99)
Total CHOL/HDL Ratio: 5.9 Ratio
Triglycerides: 168 mg/dL — ABNORMAL HIGH (ref <150)
VLDL: 34 mg/dL (ref 0–40)

## 2014-01-15 LAB — COMPREHENSIVE METABOLIC PANEL WITH GFR
Creat: 0.94 mg/dL (ref 0.50–1.35)
Sodium: 140 meq/L (ref 135–145)
Total Bilirubin: 0.6 mg/dL (ref 0.2–1.2)
Total Protein: 7.4 g/dL (ref 6.0–8.3)

## 2014-01-15 LAB — HEMOGLOBIN A1C
Hgb A1c MFr Bld: 6 % — ABNORMAL HIGH (ref <5.7)
Mean Plasma Glucose: 126 mg/dL — ABNORMAL HIGH (ref <117)

## 2014-01-15 MED ORDER — ASPIRIN EC 81 MG PO TBEC: 81.0000 mg | DELAYED_RELEASE_TABLET | Freq: Every day | ORAL | Status: DC

## 2014-01-15 NOTE — Assessment & Plan Note (Signed)
Episode of exertional chest pain has since resolved, he was ruled out in the emergency department. We are going to refer him to cardiology, he has not yet had a stress test or catheterization. Aspirin 81. Checking risk factors.

## 2014-01-15 NOTE — Assessment & Plan Note (Signed)
Chest risk factors, discussed pros and cons of prostate screening. We will not proceed with PSA testing.

## 2014-01-15 NOTE — Assessment & Plan Note (Signed)
We'll start with dietary modification.  Return in one month.

## 2014-01-15 NOTE — Progress Notes (Signed)
  Subjective:    CC: establish care  HPI: Hypertension: Present on several occasions now, no visual changes, chest pain, headaches, amenable to try dietary modification first.  Chest pain: Occurred previously, exertional, he was seen in the emergency department where he was ruled out for MI, he has not yet seen a cardiologist or had a stress test.  Annual physical exam: Discussed the pros and cons of PSA screening, patient declines to proceed with this at this time.  Obesity: Would like to start weight loss medication however understands we need a negative stress test first.  Past medical history, Surgical history, Family history not pertinant except as noted below, Social history, Allergies, and medications have been entered into the medical record, reviewed, and no changes needed.   Review of Systems: No fevers, chills, night sweats, weight loss, chest pain, or shortness of breath.   Objective:    General: Well Developed, well nourished, and in no acute distress.  Neuro: Alert and oriented x3, extra-ocular muscles intact, sensation grossly intact.  HEENT: Normocephalic, atraumatic, pupils equal round reactive to light, neck supple, no masses, no lymphadenopathy, thyroid nonpalpable.  Skin: Warm and dry, no rashes. Cardiac: Regular rate and rhythm, no murmurs rubs or gallops, no lower extremity edema.  Respiratory: Clear to auscultation bilaterally. Not using accessory muscles, speaking in full sentences.  Impression and Recommendations:

## 2014-01-15 NOTE — Assessment & Plan Note (Signed)
We'll wait until stress test before starting phentermine.

## 2014-01-16 ENCOUNTER — Other Ambulatory Visit: Payer: Self-pay | Admitting: Sports Medicine

## 2014-01-16 DIAGNOSIS — R7401 Elevation of levels of liver transaminase levels: Secondary | ICD-10-CM | POA: Insufficient documentation

## 2014-01-16 DIAGNOSIS — R740 Nonspecific elevation of levels of transaminase and lactic acid dehydrogenase [LDH]: Secondary | ICD-10-CM

## 2014-01-16 DIAGNOSIS — R7303 Prediabetes: Secondary | ICD-10-CM | POA: Insufficient documentation

## 2014-01-16 DIAGNOSIS — E785 Hyperlipidemia, unspecified: Secondary | ICD-10-CM | POA: Insufficient documentation

## 2014-01-16 LAB — TSH: TSH: 1.323 u[IU]/mL (ref 0.350–4.500)

## 2014-01-16 NOTE — Addendum Note (Signed)
Addended by: Silverio Decamp on: 01/16/2014 11:32 AM   Modules accepted: Orders

## 2014-01-16 NOTE — Assessment & Plan Note (Addendum)
Rechecking liver funk contesting an acute hepatitis panel. Patient should have blood work checked in 2 weeks.   persistently elevated, negative acute hepatitis panel, I would like him to see hepatology, he will likely need an ultrasound.

## 2014-02-08 LAB — HEPATIC FUNCTION PANEL
ALT: 89 U/L — ABNORMAL HIGH (ref 0–53)
AST: 43 U/L — ABNORMAL HIGH (ref 0–37)
Albumin: 4.6 g/dL (ref 3.5–5.2)
Alkaline Phosphatase: 63 U/L (ref 39–117)
Bilirubin, Direct: 0.1 mg/dL (ref 0.0–0.3)
Indirect Bilirubin: 0.4 mg/dL (ref 0.2–1.2)
Total Bilirubin: 0.5 mg/dL (ref 0.2–1.2)
Total Protein: 7.3 g/dL (ref 6.0–8.3)

## 2014-02-08 LAB — HEPATITIS PANEL, ACUTE
HCV Ab: NEGATIVE
Hep A IgM: NONREACTIVE
Hep B C IgM: NONREACTIVE
Hepatitis B Surface Ag: NEGATIVE

## 2014-02-08 NOTE — Addendum Note (Signed)
Addended by: Silverio Decamp on: 02/08/2014 12:59 PM   Modules accepted: Orders

## 2014-02-12 ENCOUNTER — Ambulatory Visit: Payer: BC Managed Care – PPO | Admitting: Sports Medicine

## 2014-02-12 NOTE — Progress Notes (Signed)
     HPI: 44 year old male for evaluation of chest pain. No prior cardiac history. Laboratories December 2015 showed normal hemoglobin, normal creatinine and mildly elevated liver functions. Apparently seen in urgent care in December with chest pain. Was sent to [facility] and ruled out. Records not available. Patient states that for the past several months he has had occasional chest tightness. It occurs only when he is arguing with his girlfriend. It lasts 15 minutes and resolves. No radiation. Not pleuritic, positional, related to food or exertional. No associated symptoms. He otherwise denies dyspnea on exertion, orthopnea, PND, pedal edema or exertional chest pain.  Current Outpatient Prescriptions  Medication Sig Dispense Refill  . aspirin EC 81 MG tablet Take 1 tablet (81 mg total) by mouth daily. 90 tablet 3   No current facility-administered medications for this visit.    No Known Allergies   Past Medical History  Diagnosis Date  . Hyperlipidemia   . Seasonal allergies     Past Surgical History  Procedure Laterality Date  . Knee arthroscopy    . Hernia repair    . Rotator cuff repair      History   Social History  . Marital Status: Single    Spouse Name: N/A    Number of Children: 3  . Years of Education: N/A   Occupational History  . Not on file.   Social History Main Topics  . Smoking status: Never Smoker   . Smokeless tobacco: Not on file  . Alcohol Use: 1.8 oz/week    3 Not specified per week     Comment: Occasional  . Drug Use: No  . Sexual Activity: Not on file   Other Topics Concern  . Not on file   Social History Narrative    Family History  Problem Relation Age of Onset  . Cancer Father     prostate    ROS: no fevers or chills, productive cough, hemoptysis, dysphasia, odynophagia, melena, hematochezia, dysuria, hematuria, rash, seizure activity, orthopnea, PND, pedal edema, claudication. Remaining systems are  negative.  Physical Exam:   Blood pressure 136/88, pulse 74, height 6' 3"  (1.905 m), weight 276 lb (125.193 kg).  General:  Well developed/well nourished in NAD Skin warm/dry Patient not depressed No peripheral clubbing Back-normal HEENT-normal/normal eyelids Neck supple/normal carotid upstroke bilaterally; no bruits; no JVD; no thyromegaly chest - CTA/ normal expansion CV - RRR/normal S1 and S2; no murmurs, rubs or gallops;  PMI nondisplaced Abdomen -NT/ND, no HSM, no mass, + bowel sounds, no bruit 2+ femoral pulses, no bruits Ext-no edema, chords, 2+ DP Neuro-grossly nonfocal  ECG 01/04/2014-sinus rhythm with no ST changes.

## 2014-02-14 ENCOUNTER — Encounter: Payer: Self-pay | Admitting: Cardiology

## 2014-02-14 ENCOUNTER — Ambulatory Visit (INDEPENDENT_AMBULATORY_CARE_PROVIDER_SITE_OTHER): Payer: BLUE CROSS/BLUE SHIELD | Admitting: Cardiology

## 2014-02-14 VITALS — BP 136/88 | HR 74 | Ht 75.0 in | Wt 276.0 lb

## 2014-02-14 DIAGNOSIS — R079 Chest pain, unspecified: Secondary | ICD-10-CM

## 2014-02-14 DIAGNOSIS — R072 Precordial pain: Secondary | ICD-10-CM

## 2014-02-14 DIAGNOSIS — R740 Nonspecific elevation of levels of transaminase and lactic acid dehydrogenase [LDH]: Secondary | ICD-10-CM

## 2014-02-14 DIAGNOSIS — R7401 Elevation of levels of liver transaminase levels: Secondary | ICD-10-CM

## 2014-02-14 NOTE — Patient Instructions (Signed)
Your physician recommends that you schedule a follow-up appointment in: AS NEEDED PENDING TEST RESULTS  Your physician has requested that you have an exercise tolerance test. For further information please visit [email or web address]. Please also follow instruction sheet, as given.     Exercise Stress Electrocardiogram An exercise stress electrocardiogram is a test to check how blood flows to your heart. It is done to find areas of poor blood flow. You will need to walk on a treadmill for this test. The electrocardiogram will record your heartbeat when you are at rest and when you are exercising. BEFORE THE PROCEDURE  Do not have drinks with caffeine or foods with caffeine for 24 hours before the test, or as told by your doctor. This includes coffee, tea (even decaf tea), sodas, chocolate, and cocoa.  Follow your doctor's instructions about eating and drinking before the test.  Ask your doctor what medicines you should or should not take before the test. Take your medicines with water unless told by your doctor not to.  If you use an inhaler, bring it with you to the test.  Bring a snack to eat after the test.  Do not  smoke for 4 hours before the test.  Do not put lotions, powders, creams, or oils on your chest before the test.  Wear comfortable shoes and clothing. PROCEDURE  You will have patches put on your chest. Small areas of your chest may need to be shaved. Wires will be connected to the patches.  Your heart rate will be watched while you are resting and while you are exercising.  You will walk on the treadmill. The treadmill will slowly get faster to raise your heart rate.  The test will take about 1-2 hours. AFTER THE PROCEDURE  Your heart rate and blood pressure will be watched after the test.  You may return to your normal diet, activities, and medicines or as told by your doctor. Document Released: 06/24/2007 Document Revised: 05/22/2013 Document Reviewed:  09/12/2012 [facility] Patient Information 2015 [place], [place]. This information is not intended to replace advice given to you by your health care provider. Make sure you discuss any questions you have with your health care provider.

## 2014-02-14 NOTE — Assessment & Plan Note (Signed)
Symptoms are atypical. Plan exercise treadmill for risk stratification.

## 2014-02-14 NOTE — Assessment & Plan Note (Signed)
Further evaluation per primary care.

## 2014-02-20 ENCOUNTER — Ambulatory Visit (INDEPENDENT_AMBULATORY_CARE_PROVIDER_SITE_OTHER): Payer: BLUE CROSS/BLUE SHIELD

## 2014-02-20 ENCOUNTER — Encounter: Payer: Self-pay | Admitting: Sports Medicine

## 2014-02-20 ENCOUNTER — Other Ambulatory Visit: Payer: Self-pay | Admitting: Sports Medicine

## 2014-02-20 ENCOUNTER — Ambulatory Visit (INDEPENDENT_AMBULATORY_CARE_PROVIDER_SITE_OTHER): Payer: Self-pay | Admitting: Sports Medicine

## 2014-02-20 VITALS — BP 131/80 | HR 76 | Ht 75.0 in | Wt 274.0 lb

## 2014-02-20 DIAGNOSIS — R7401 Elevation of levels of liver transaminase levels: Secondary | ICD-10-CM

## 2014-02-20 DIAGNOSIS — E669 Obesity, unspecified: Secondary | ICD-10-CM

## 2014-02-20 DIAGNOSIS — R740 Nonspecific elevation of levels of transaminase and lactic acid dehydrogenase [LDH]: Secondary | ICD-10-CM

## 2014-02-20 DIAGNOSIS — E785 Hyperlipidemia, unspecified: Secondary | ICD-10-CM

## 2014-02-20 DIAGNOSIS — I10 Essential (primary) hypertension: Secondary | ICD-10-CM

## 2014-02-20 DIAGNOSIS — Z23 Encounter for immunization: Secondary | ICD-10-CM

## 2014-02-20 DIAGNOSIS — K760 Fatty (change of) liver, not elsewhere classified: Secondary | ICD-10-CM

## 2014-02-20 NOTE — Assessment & Plan Note (Signed)
We will work on weight loss before starting statin.

## 2014-02-20 NOTE — Progress Notes (Signed)
  Subjective:    CC: Follow-up  HPI: Atypical chest pain: Is scheduled for stress test in a couple weeks.  Obesity: Awaiting stress test before we start phentermine.  Transaminitis: Improved slightly on the recheck however we did refer to hepatology, he has not yet heard back from them, viral serologies were negative. Asymptomatic.  Hypertension: Well controlled with dietary modification. Past medical history, Surgical history, Family history not pertinant except as noted below, Social history, Allergies, and medications have been entered into the medical record, reviewed, and no changes needed.   Review of Systems: No fevers, chills, night sweats, weight loss, chest pain, or shortness of breath.   Objective:    General: Well Developed, well nourished, and in no acute distress.  Neuro: Alert and oriented x3, extra-ocular muscles intact, sensation grossly intact.  HEENT: Normocephalic, atraumatic, pupils equal round reactive to light, neck supple, no masses, no lymphadenopathy, thyroid nonpalpable.  Skin: Warm and dry, no rashes. Cardiac: Regular rate and rhythm, no murmurs rubs or gallops, no lower extremity edema.  Respiratory: Clear to auscultation bilaterally. Not using accessory muscles, speaking in full sentences. Abdomen: Soft, nontender, nondistended, normal bowel sounds, no palpable masses, no organomegaly.  Impression and Recommendations:

## 2014-02-20 NOTE — Assessment & Plan Note (Signed)
Repeat blood work did confirm mild transaminitis. I do think this is likely related to hepatic steatosis, we have done a referral to hepatology and are waiting on follow-up. In the meantime I'm going to obtain autoimmune and genetic testing, and a liver ultrasound. Hepatitis viral serologies were negative. He does binge drink occasionally, and will stop.

## 2014-02-20 NOTE — Assessment & Plan Note (Signed)
Diet controlled now.

## 2014-02-20 NOTE — Addendum Note (Signed)
Addended by: Silverio Decamp on: 02/20/2014 02:28 PM   Modules accepted: Orders

## 2014-02-20 NOTE — Assessment & Plan Note (Signed)
Awaiting stress test before we start weight loss medication, he will make a follow-up visit after his stress test.

## 2014-02-21 LAB — IRON AND TIBC
%SAT: 27 % (ref 20–55)
Iron: 93 ug/dL (ref 42–165)
TIBC: 347 ug/dL (ref 215–435)
UIBC: 254 ug/dL (ref 125–400)

## 2014-02-21 LAB — ANA: Anti Nuclear Antibody(ANA): NEGATIVE

## 2014-02-21 LAB — FERRITIN: Ferritin: 222 ng/mL (ref 22–322)

## 2014-02-22 LAB — ALPHA-1-ANTITRYPSIN: A-1 Antitrypsin, Ser: 119 mg/dL (ref 83–199)

## 2014-02-22 LAB — CERULOPLASMIN: Ceruloplasmin: 25 mg/dL (ref 18–36)

## 2014-02-23 LAB — ANTI-SMOOTH MUSCLE ANTIBODY, IGG: Smooth Muscle Ab: 14 U (ref <20)

## 2014-02-24 LAB — ANTI-MICROSOMAL ANTIBODY LIVER / KIDNEY: LKM1 Ab: <=20 U (ref <=20.0)

## 2014-03-02 ENCOUNTER — Encounter (HOSPITAL_COMMUNITY): Payer: Self-pay

## 2014-03-16 ENCOUNTER — Encounter: Payer: Self-pay | Admitting: Internal Medicine

## 2014-03-21 ENCOUNTER — Telehealth (HOSPITAL_COMMUNITY): Payer: Self-pay

## 2014-03-21 NOTE — Telephone Encounter (Signed)
Encounter complete. 

## 2014-03-22 ENCOUNTER — Telehealth (HOSPITAL_COMMUNITY): Payer: Self-pay

## 2014-03-22 NOTE — Telephone Encounter (Signed)
Encounter complete. 

## 2014-03-23 ENCOUNTER — Ambulatory Visit (HOSPITAL_COMMUNITY)
Admission: RE | Admit: 2014-03-23 | Discharge: 2014-03-23 | Disposition: A | Discharge status: Home / Self Care | Payer: BLUE CROSS/BLUE SHIELD | Source: Ambulatory Visit | Attending: Cardiovascular Disease | Admitting: Cardiovascular Disease

## 2014-03-23 DIAGNOSIS — R079 Chest pain, unspecified: Secondary | ICD-10-CM | POA: Diagnosis not present

## 2014-03-23 NOTE — Procedures (Signed)
Exercise Treadmill Test     Test  Exercise Tolerance Test Ordering MD: Kirk Ruths, MD    Unique Test No: 1  Treadmill:  1  Indication for ETT: chest pain - rule out ischemia  Contraindication to ETT: No   Stress Modality: exercise - treadmill  Cardiac Imaging Performed: non   Protocol: standard Bruce - maximal  Max BP:  180/113  Max MPHR (bpm):  177 85% MPR (bpm):  150  MPHR obtained (bpm):  164 % MPHR obtained:  92  Reached 85% MPHR (min:sec):  7:15 Total Exercise Time (min-sec):  10  Workload in METS:  11.9 Borg Scale: 15  Reason ETT Terminated:  Leg Fatigue/discomfort, General Fatigue and SOB Patient was suffering with a cold and cough   ST Segment Analysis At Rest: non-specific ST segment slurring With Exercise: no evidence of significant ST depression  Other Information Arrhythmia:  No Angina during ETT:  absent (0) Quality of ETT:  diagnostic  ETT Interpretation:  normal - no evidence of ischemia by ST analysis  Comments: The patient had an poor exercise tolerance but he was suffering from a cold and cough.  There was no chest pain.  There was an appropriate level of dyspnea.  There were no arrhythmias, a normal heart rate response and normal BP response.  There were no ischemic ST T wave changes and a normal heart rate recovery.  He did have an abnormal baseline EKG.     Recommendations: No high risk findings.  No evidence of ischemia.  He had SOB but was not feeling his best at the time of the stud.

## 2014-03-26 ENCOUNTER — Encounter: Payer: Self-pay | Admitting: Emergency Medicine

## 2014-03-26 ENCOUNTER — Emergency Department
Admission: EM | Admit: 2014-03-26 | Discharge: 2014-03-26 | Disposition: A | Discharge status: Home / Self Care | Payer: BLUE CROSS/BLUE SHIELD | Source: Home / Self Care | Attending: Family Medicine | Admitting: Family Medicine

## 2014-03-26 DIAGNOSIS — J209 Acute bronchitis, unspecified: Secondary | ICD-10-CM

## 2014-03-26 MED ORDER — AZITHROMYCIN 250 MG PO TABS: ORAL_TABLET | ORAL | Status: DC

## 2014-03-26 NOTE — ED Notes (Signed)
Productive cough, dark yellow mucus, congestion x 1 week

## 2014-03-26 NOTE — ED Provider Notes (Signed)
CSN: [id number]     Arrival date & time 03/26/14  1607 History   First MD Initiated Contact with Patient 03/26/14 [phone]     Chief Complaint  Patient presents with  . Cough      HPI Comments: Patient complains of onset of minimal sore throat and fatigue about 8 days ago, followed by a cough the next day.  He has had no nasal congestion.  His cough persists, productive of dark yellow mucous  The history is provided by the patient.    Past Medical History  Diagnosis Date  . Hyperlipidemia   . Seasonal allergies    Past Surgical History  Procedure Laterality Date  . Knee arthroscopy    . Hernia repair    . Rotator cuff repair     Family History  Problem Relation Age of Onset  . Cancer Father     prostate   History  Substance Use Topics  . Smoking status: Never Smoker   . Smokeless tobacco: Not on file  . Alcohol Use: 1.8 oz/week    3 Standard drinks or equivalent per week     Comment: Occasional    Review of Systems + sore throat + cough No pleuritic pain No wheezing No nasal congestion + post-nasal drainage No sinus pain/pressure No itchy/red eyes No earache No hemoptysis No SOB No fever/chills No nausea No vomiting No abdominal pain No diarrhea No urinary symptoms No skin rash + fatigue No myalgias No headache Used OTC meds without relief  Allergies  Review of patient's allergies indicates not on file.  Home Medications   Prior to Admission medications   Medication Sig Start Date End Date Taking? Authorizing Provider  dextromethorphan-guaiFENesin (MUCINEX DM) 30-600 MG per 12 hr tablet Take 1 tablet by mouth 2 (two) times daily.   Yes Historical Provider, MD  aspirin EC 81 MG tablet Take 1 tablet (81 mg total) by mouth daily. 01/15/14   Silverio Decamp, MD  azithromycin (ZITHROMAX Z-PAK) 250 MG tablet Take 2 tabs today; then begin one tab once daily for 4 more days. 03/26/14   Kandra Nicolas, MD   BP 150/95 mmHg  Pulse 95  Temp(Src) 98.5 F  (36.9 C) (Oral)  Ht 6' 3"  (1.905 m)  Wt 280 lb (127.007 kg)  BMI 35.00 kg/m2  SpO2 96% Physical Exam Nursing notes and Vital Signs reviewed. Appearance:  Patient appears stated age, and in no acute distress.  Patient is obese (BMI   35.0) Eyes:  Pupils are equal, round, and reactive to light and accomodation.  Extraocular movement is intact.  Conjunctivae are not inflamed  Ears:  Canals normal.  Tympanic membranes normal.  Nose:  Mildly congested turbinates.  No sinus tenderness.   Pharynx:  Normal Neck:  Supple.  No adenopathy Lungs:  Clear to auscultation.  Breath sounds are equal.  Heart:  Regular rate and rhythm without murmurs, rubs, or gallops.  Abdomen:  Nontender without masses or hepatosplenomegaly.  Bowel sounds are present.  No CVA or flank tenderness.  Extremities:  No edema.  No calf tenderness Skin:  No rash present.    ED Course  Procedures  none     MDM   1. Acute bronchitis, unspecified organism    Begin Z-pack for atypical coverage. Take plain guaifenesin (1219m extended release tabs such as Mucinex) twice daily, with plenty of water, for cough and congestion.  Get adequate rest.   May take Delsym Cough Suppressant at bedtime for nighttime cough.  Stop all antihistamines for now, and other non-prescription cough/cold preparations.  Follow-up with family doctor if not improving about 7 to10 days.    Kandra Nicolas, MD 03/26/14 [phone]

## 2014-03-26 NOTE — Discharge Instructions (Signed)
Take plain guaifenesin (125m extended release tabs such as Mucinex) twice daily, with plenty of water, for cough and congestion.  Get adequate rest.   May take Delsym Cough Suppressant at bedtime for nighttime cough.  Stop all antihistamines for now, and other non-prescription cough/cold preparations.  Follow-up with family doctor if not improving about 7 to10 days.

## 2014-05-01 ENCOUNTER — Ambulatory Visit (INDEPENDENT_AMBULATORY_CARE_PROVIDER_SITE_OTHER): Payer: BLUE CROSS/BLUE SHIELD | Admitting: Internal Medicine

## 2014-05-01 ENCOUNTER — Encounter: Payer: Self-pay | Admitting: Internal Medicine

## 2014-05-01 VITALS — BP 124/80 | HR 80 | Ht 74.5 in | Wt 284.0 lb

## 2014-05-01 DIAGNOSIS — K760 Fatty (change of) liver, not elsewhere classified: Secondary | ICD-10-CM | POA: Diagnosis not present

## 2014-05-01 DIAGNOSIS — R0789 Other chest pain: Secondary | ICD-10-CM | POA: Diagnosis not present

## 2014-05-01 DIAGNOSIS — R945 Abnormal results of liver function studies: Secondary | ICD-10-CM

## 2014-05-01 DIAGNOSIS — Z1211 Encounter for screening for malignant neoplasm of colon: Secondary | ICD-10-CM

## 2014-05-01 DIAGNOSIS — R7989 Other specified abnormal findings of blood chemistry: Secondary | ICD-10-CM | POA: Diagnosis not present

## 2014-05-01 DIAGNOSIS — R932 Abnormal findings on diagnostic imaging of liver and biliary tract: Secondary | ICD-10-CM | POA: Diagnosis not present

## 2014-05-01 NOTE — Progress Notes (Signed)
HISTORY OF PRESENT ILLNESS:  Jerry Escobar is a 44 y.o. male with past medical history as listed below who presents, upon referral from Dr. Dianah Escobar, today regarding abnormal liver tests and probable fatty liver. Patient was having problems with atypical chest pain for which she was evaluated by cardiology in January. He subsequently underwent a stress test which was negative. His pain has resolved without recurrence. This has been initiated to stress. He denies heartburn, water brash, weight loss, epigastric pain, or dysphagia. He has been noticed to have abnormal hepatic transaminases (less than 2 times upper limit of normal) in December 2015 and again in January 2016. He has hyperlipidemia. Abdominal ultrasound was performed 02/20/2014. He was found to have mild diffuse hepatic steatosis with no other abnormalities. He underwent testing for viral and nonviral entities that cause elevated hepatic transaminases. These are negative. He has been chronically obese with his current BMI approximately 36. He does use alcohol sporadically, generally on the weekends. There is no family history of liver disease. GI review of systems is otherwise negative. No family history of colon cancer.  REVIEW OF SYSTEMS:  All non-GI ROS negative except for sinus allergy trouble, chest pain (resolved)  Past Medical History  Diagnosis Date  . Hyperlipidemia   . Seasonal allergies   . Hypertension   . Transaminitis     Past Surgical History  Procedure Laterality Date  . Knee arthroscopy    . Hernia repair    . Rotator cuff repair      Social History Jerry Escobar  reports that he has never smoked. He has never used smokeless tobacco. He reports that he drinks about 1.8 oz of alcohol per week. He reports that he does not use illicit drugs.  family history includes Breast cancer in his paternal grandmother; Cancer in his father.  No Known Allergies     PHYSICAL EXAMINATION: Vital signs: BP 124/80 mmHg   Pulse 80  Ht 6' 2.5" (1.892 m)  Wt 284 lb (128.822 kg)  BMI 35.99 kg/m2  Constitutional: Obese, generally well-appearing, no acute distress Psychiatric: alert and oriented x3, cooperative Eyes: extraocular movements intact, anicteric, conjunctiva pink Mouth: oral pharynx moist, no lesions Neck: supple no lymphadenopathy Cardiovascular: heart regular rate and rhythm, no murmur Lungs: clear to auscultation bilaterally Abdomen: soft, obese, nontender, nondistended, no obvious ascites, no peritoneal signs, normal bowel sounds, no organomegaly Rectal: Omitted Extremities: no lower extremity edema bilaterally Skin: no lesions on visible extremities Neuro: No focal deficits. No asterixis.    ASSESSMENT:  #1. Abnormal hepatic transaminases. Most likely fatty liver. Other workup as described negative. No evidence for chronic liver disease or hepatic synthetic dysfunction #2. Abnormal hepatic imaging consistent with fatty liver #3. Atypical chest pain. Noncardiac non-GI. Resolved  #4. Colon cancer screening. Baseline risk #5. Obesity. Current BMI 36   PLAN:  #1. Detailed discussion today on the spectrum of fatty liver disease including possible progression from Jerry Escobar to cirrhosis. #2. Recommended weight loss and regular exercise for fatty liver #3. Periodic liver tests with his PCP, at least annually or as clinically indicated #4. Screening colonoscopy at age 53. Advised #5. Limit alcohol to no more than 3 drinks, when drinking  Minutes has been spent face-to-face with this patient, greater than half of the time spent counseling regarding fatty liver disease, abnormal hepatic transaminases, noncardiac chest pain, obesity, and colon cancer screening. Multiple questions answered as well  A copy of this consultation note has been sent to the referring physician Dr. Dianah Escobar

## 2014-05-01 NOTE — Patient Instructions (Signed)
Per Dr. Henrene Pastor, work on weight loss with diet and exercise.  Continue to follow up with your primary care physcian

## 2015-04-03 ENCOUNTER — Encounter: Payer: BLUE CROSS/BLUE SHIELD | Admitting: Sports Medicine

## 2015-04-09 ENCOUNTER — Encounter: Payer: Self-pay | Admitting: Sports Medicine

## 2015-04-09 ENCOUNTER — Ambulatory Visit (INDEPENDENT_AMBULATORY_CARE_PROVIDER_SITE_OTHER): Payer: BLUE CROSS/BLUE SHIELD | Admitting: Sports Medicine

## 2015-04-09 VITALS — BP 138/84 | HR 88 | Resp 18 | Ht 75.0 in | Wt 285.1 lb

## 2015-04-09 DIAGNOSIS — I10 Essential (primary) hypertension: Secondary | ICD-10-CM | POA: Diagnosis not present

## 2015-04-09 DIAGNOSIS — Z0000 Encounter for general adult medical examination without abnormal findings: Secondary | ICD-10-CM

## 2015-04-09 DIAGNOSIS — R072 Precordial pain: Secondary | ICD-10-CM

## 2015-04-09 DIAGNOSIS — E785 Hyperlipidemia, unspecified: Secondary | ICD-10-CM

## 2015-04-09 LAB — LIPID PANEL
Cholesterol: 197 mg/dL (ref 125–200)
HDL: 37 mg/dL — ABNORMAL LOW (ref >=40)
LDL Cholesterol: 129 mg/dL (ref <130)
Total CHOL/HDL Ratio: 5.3 Ratio — ABNORMAL HIGH (ref <=5.0)
Triglycerides: 157 mg/dL — ABNORMAL HIGH (ref <150)
VLDL: 31 mg/dL — ABNORMAL HIGH (ref <30)

## 2015-04-09 LAB — COMPREHENSIVE METABOLIC PANEL
BUN: 14 mg/dL (ref 7–25)
CO2: 25 mmol/L (ref 20–31)
Chloride: 105 mmol/L (ref 98–110)
Glucose, Bld: 91 mg/dL (ref 65–99)
Potassium: 3.9 mmol/L (ref 3.5–5.3)
Total Bilirubin: 0.6 mg/dL (ref 0.2–1.2)
Total Protein: 7.4 g/dL (ref 6.1–8.1)

## 2015-04-09 LAB — COMPREHENSIVE METABOLIC PANEL WITH GFR
ALT: 58 U/L — ABNORMAL HIGH (ref 9–46)
AST: 37 U/L (ref 10–40)
Albumin: 4.4 g/dL (ref 3.6–5.1)
Alkaline Phosphatase: 60 U/L (ref 40–115)
Calcium: 9 mg/dL (ref 8.6–10.3)
Creat: 1.02 mg/dL (ref 0.60–1.35)
Sodium: 139 mmol/L (ref 135–146)

## 2015-04-09 LAB — CBC
HCT: 39.5 % (ref 39.0–52.0)
Hemoglobin: 13.6 g/dL (ref 13.0–17.0)
MCH: 27.8 pg (ref 26.0–34.0)
MCHC: 34.4 g/dL (ref 30.0–36.0)
MCV: 80.6 fL (ref 78.0–100.0)
MPV: 11.2 fL (ref 8.6–12.4)
Platelets: 187 10*3/uL (ref 150–400)
RBC: 4.9 MIL/uL (ref 4.22–5.81)
RDW: 13.8 % (ref 11.5–15.5)
WBC: 4.8 K/uL (ref 4.0–10.5)

## 2015-04-09 LAB — TSH: TSH: 1.37 m[IU]/L (ref 0.40–4.50)

## 2015-04-09 NOTE — Assessment & Plan Note (Signed)
Complete physical as above , checking routine blood work.

## 2015-04-09 NOTE — Assessment & Plan Note (Signed)
Rechecking lipids

## 2015-04-09 NOTE — Progress Notes (Signed)
  Subjective:    CC: complete physical exam  HPI:  This is a pleasant 45 year old male, he is here for his physical, he had a negative stress test recently, also had some issues with transaminitis which has resolved. He does have a bit of leg swelling but no paroxysmal nocturnal dyspnea or orthopnea.  Hypertension: Slightly elevated diastolics  Hyperlipidemia: Due for recheck  Past medical history, Surgical history, Family history not pertinant except as noted below, Social history, Allergies, and medications have been entered into the medical record, reviewed, and no changes needed.   Review of Systems: No headache, visual changes, nausea, vomiting, diarrhea, constipation, dizziness, abdominal pain, skin rash, fevers, chills, night sweats, swollen lymph nodes, weight loss, chest pain, body aches, joint swelling, muscle aches, shortness of breath, mood changes, visual or auditory hallucinations.  Objective:    General: Well Developed, well nourished, and in no acute distress.  Neuro: Alert and oriented x3, extra-ocular muscles intact, sensation grossly intact. Cranial nerves II through XII are intact, motor, sensory, and coordinative functions are all intact. HEENT: Normocephalic, atraumatic, pupils equal round reactive to light, neck supple, no masses, no lymphadenopathy, thyroid nonpalpable. Oropharynx, nasopharynx, external ear canals are unremarkable. Skin: Warm and dry, no rashes noted.  Cardiac: Regular rate and rhythm, no murmurs rubs or gallops. 2+ lower extremity edema, symmetric in bilateral Respiratory: Clear to auscultation bilaterally. Not using accessory muscles, speaking in full sentences.  Abdominal: Soft, nontender, nondistended, positive bowel sounds, no masses, no organomegaly.  Musculoskeletal: Shoulder, elbow, wrist, hip, knee, ankle stable, and with full range of motion.  Impression and Recommendations:    The patient was counselled, risk factors were discussed,  anticipatory guidance given.

## 2015-04-09 NOTE — Assessment & Plan Note (Signed)
Slightly elevated. Work on low sodium diet and return in 4 weeks to recheck.

## 2015-04-09 NOTE — Assessment & Plan Note (Signed)
Bit of lower extremity swelling, adding BNP

## 2015-04-10 LAB — HEMOGLOBIN A1C
Hgb A1c MFr Bld: 5.9 % — ABNORMAL HIGH (ref <5.7)
Mean Plasma Glucose: 123 mg/dL — ABNORMAL HIGH (ref <117)

## 2015-04-10 LAB — URINALYSIS
Bilirubin Urine: NEGATIVE
Glucose, UA: NEGATIVE
Hgb urine dipstick: NEGATIVE
Ketones, ur: NEGATIVE
Leukocytes, UA: NEGATIVE
Nitrite: NEGATIVE
Protein, ur: NEGATIVE
Specific Gravity, Urine: 1.022 (ref 1.001–1.035)
pH: 5.5 (ref 5.0–8.0)

## 2015-04-10 LAB — BRAIN NATRIURETIC PEPTIDE: Brain Natriuretic Peptide: 13.3 pg/mL (ref <100)

## 2015-05-07 ENCOUNTER — Encounter: Payer: Self-pay | Admitting: Sports Medicine

## 2015-05-07 ENCOUNTER — Ambulatory Visit (INDEPENDENT_AMBULATORY_CARE_PROVIDER_SITE_OTHER): Payer: BLUE CROSS/BLUE SHIELD | Admitting: Sports Medicine

## 2015-05-07 DIAGNOSIS — E669 Obesity, unspecified: Secondary | ICD-10-CM

## 2015-05-07 MED ORDER — PHENTERMINE HCL 37.5 MG PO TABS: ORAL_TABLET | ORAL | Status: DC

## 2015-05-07 NOTE — Progress Notes (Signed)
  Subjective:    CC: Follow-up  HPI: Elevated blood pressure: Stable  Obesity: Would like to start weight loss medication.  Past medical history, Surgical history, Family history not pertinant except as noted below, Social history, Allergies, and medications have been entered into the medical record, reviewed, and no changes needed.   Review of Systems: No fevers, chills, night sweats, weight loss, chest pain, or shortness of breath.   Objective:    General: Well Developed, well nourished, and in no acute distress.  Neuro: Alert and oriented x3, extra-ocular muscles intact, sensation grossly intact.  HEENT: Normocephalic, atraumatic, pupils equal round reactive to light, neck supple, no masses, no lymphadenopathy, thyroid nonpalpable.  Skin: Warm and dry, no rashes. Cardiac: Regular rate and rhythm, no murmurs rubs or gallops, no lower extremity edema.  Respiratory: Clear to auscultation bilaterally. Not using accessory muscles, speaking in full sentences.  Impression and Recommendations:    I spent 25 minutes with this patient, greater than 50% was face-to-face time counseling regarding the above diagnoses

## 2015-05-07 NOTE — Assessment & Plan Note (Signed)
Starting phentermine, return month for weight checks and refills

## 2015-06-04 ENCOUNTER — Ambulatory Visit (INDEPENDENT_AMBULATORY_CARE_PROVIDER_SITE_OTHER): Payer: BLUE CROSS/BLUE SHIELD | Admitting: Sports Medicine

## 2015-06-04 ENCOUNTER — Encounter: Payer: Self-pay | Admitting: Sports Medicine

## 2015-06-04 VITALS — BP 136/90 | HR 83 | Resp 18 | Wt 279.9 lb

## 2015-06-04 DIAGNOSIS — E669 Obesity, unspecified: Secondary | ICD-10-CM

## 2015-06-04 MED ORDER — PHENTERMINE HCL 37.5 MG PO TABS: ORAL_TABLET | ORAL | Status: DC

## 2015-06-04 NOTE — Assessment & Plan Note (Signed)
Minimal weight loss after the first month on phentermine. Had some stresses throughout the month. Refilling medication, if insufficient weight loss at the next visit we will add Topamax.

## 2015-06-04 NOTE — Progress Notes (Signed)
  Subjective:    CC: Weight check  HPI: 4 pound weight loss in the last month, this was the first month on phentermine, had several stressors.  Past medical history, Surgical history, Family history not pertinant except as noted below, Social history, Allergies, and medications have been entered into the medical record, reviewed, and no changes needed.   Review of Systems: No fevers, chills, night sweats, weight loss, chest pain, or shortness of breath.   Objective:    General: Well Developed, well nourished, and in no acute distress.  Neuro: Alert and oriented x3, extra-ocular muscles intact, sensation grossly intact.  HEENT: Normocephalic, atraumatic, pupils equal round reactive to light, neck supple, no masses, no lymphadenopathy, thyroid nonpalpable.  Skin: Warm and dry, no rashes. Cardiac: Regular rate and rhythm, no murmurs rubs or gallops, no lower extremity edema.  Respiratory: Clear to auscultation bilaterally. Not using accessory muscles, speaking in full sentences.  Impression and Recommendations:

## 2015-06-04 NOTE — Addendum Note (Signed)
Addended by: Elizabeth Sauer on: 06/04/2015 10:04 AM   Modules accepted: Medications

## 2015-07-02 ENCOUNTER — Ambulatory Visit: Payer: BLUE CROSS/BLUE SHIELD | Admitting: Sports Medicine

## 2015-07-04 ENCOUNTER — Encounter: Payer: Self-pay | Admitting: Sports Medicine

## 2015-07-04 ENCOUNTER — Ambulatory Visit (INDEPENDENT_AMBULATORY_CARE_PROVIDER_SITE_OTHER): Payer: BLUE CROSS/BLUE SHIELD | Admitting: Sports Medicine

## 2015-07-04 DIAGNOSIS — E669 Obesity, unspecified: Secondary | ICD-10-CM

## 2015-07-04 MED ORDER — TOPIRAMATE 50 MG PO TABS: ORAL_TABLET | ORAL | Status: DC

## 2015-07-04 MED ORDER — PHENTERMINE HCL 37.5 MG PO TABS: ORAL_TABLET | ORAL | Status: DC

## 2015-07-04 NOTE — Assessment & Plan Note (Signed)
Improved weight loss after the second month on phentermine. He did just come back from vacation. Adding Topamax to accelerate weight loss. Return in one month.

## 2015-07-04 NOTE — Progress Notes (Signed)
  Subjective:    CC: Follow-up  HPI: Doing returns, he's lost some weight after the last visit. He did go on vacation and didn't take his pills for a week.  Past medical history, Surgical history, Family history not pertinant except as noted below, Social history, Allergies, and medications have been entered into the medical record, reviewed, and no changes needed.   Review of Systems: No fevers, chills, night sweats, weight loss, chest pain, or shortness of breath.   Objective:    General: Well Developed, well nourished, and in no acute distress.  Neuro: Alert and oriented x3, extra-ocular muscles intact, sensation grossly intact.  HEENT: Normocephalic, atraumatic, pupils equal round reactive to light, neck supple, no masses, no lymphadenopathy, thyroid nonpalpable.  Skin: Warm and dry, no rashes. Cardiac: Regular rate and rhythm, no murmurs rubs or gallops, no lower extremity edema.  Respiratory: Clear to auscultation bilaterally. Not using accessory muscles, speaking in full sentences.  Impression and Recommendations:

## 2015-08-01 ENCOUNTER — Ambulatory Visit: Payer: BLUE CROSS/BLUE SHIELD | Admitting: Sports Medicine

## 2016-03-12 ENCOUNTER — Encounter: Payer: Self-pay | Admitting: Osteopathic Medicine

## 2016-03-12 ENCOUNTER — Ambulatory Visit (INDEPENDENT_AMBULATORY_CARE_PROVIDER_SITE_OTHER): Payer: BLUE CROSS/BLUE SHIELD | Admitting: Osteopathic Medicine

## 2016-03-12 VITALS — BP 159/92 | HR 118 | Temp 98.1°F | Ht 75.0 in | Wt 283.6 lb

## 2016-03-12 DIAGNOSIS — J0110 Acute frontal sinusitis, unspecified: Secondary | ICD-10-CM | POA: Diagnosis not present

## 2016-03-12 DIAGNOSIS — R030 Elevated blood-pressure reading, without diagnosis of hypertension: Secondary | ICD-10-CM | POA: Diagnosis not present

## 2016-03-12 MED ORDER — IPRATROPIUM BROMIDE 0.03 % NA SOLN: 2.0000 | Freq: Four times a day (QID) | NASAL | 0 refills | Status: DC | PRN

## 2016-03-12 MED ORDER — AMOXICILLIN-POT CLAVULANATE 875-125 MG PO TABS: 1.0000 | ORAL_TABLET | Freq: Two times a day (BID) | ORAL | 0 refills | Status: DC

## 2016-03-12 NOTE — Progress Notes (Signed)
HPI: Jerry Escobar is a 46 y.o. male who presents to [facility] 03/12/16 for chief complaint of:  Chief Complaint  Patient presents with  . Nasal Congestion    Acute Illness: sinus congestion x1 week, no OTC meds helping, drainage clear and yellow, no fever.    BP elevated at beginning of visit: previous values have been normal. HR improved on exam. History of hypertension, patient is not currently on any medications for this issue. No chest pain/shortness of breath.   Past medical, social and family history reviewed.  Immune compromising conditions or other risk factors: Hx HTN, metabolic syndrome  Patient Active Problem List   Diagnosis Date Noted  . Hyperlipidemia 01/16/2014  . Transaminitis 01/16/2014  . Prediabetes 01/16/2014  . Annual physical exam 01/15/2014  . Essential hypertension, benign 01/15/2014  . Obesity 01/15/2014  . Chest pain 01/08/2014  . Meralgia paresthetica of right side 11/27/2013  . Osteoarthritis of left hip 09/18/2013     Current medications and allergies reviewed.  Outpatient Encounter Prescriptions as of 03/12/2016  Medication Sig  . Multiple Vitamins-Minerals (MULTIVITAMIN ADULT PO) Take 1 capsule by mouth daily.  . Omega-3 Fatty Acids (FISH OIL PO) Take 1 capsule by mouth daily.  . phentermine (ADIPEX-P) 37.5 MG tablet One tab by mouth qAM  . topiramate (TOPAMAX) 50 MG tablet One half tab by mouth daily for a week, then one tab by mouth daily.   No facility-administered encounter medications on file as of 03/12/2016.      Review of Systems:  Constitutional: No  fever/chills  HEENT: Yes  headache, No  sore throat, No  swollen glands  Cardiovascular: No chest pain  Respiratory:some cough, No  shortness of breath  Gastrointestinal: No  nausea, No  vomiting,  No  diarrhea  Musculoskeletal:   No  myalgia/arthralgia  Skin/Integument:  No  rash   Detailed Exam:  BP (!) 159/92   Pulse (!) 118    Temp 98.1 F (36.7 C)   Ht 6' 3"  (1.905 m)   Wt 283 lb 9.6 oz (128.6 kg)   SpO2 98%   BMI 35.45 kg/m   Constitutional:   VSS, see above.   General Appearance: alert, well-developed, well-nourished, NAD  Head, Eyes:   Normal lids and conjunctive, non-icteric sclera  TTP frontal sinuses on R  Ears, Nose, Mouth, Throat:   Normal external inspection ears/nares  Normal mouth/lips/gums, MMM  normal TM  posterior pharynx with erythema, without exudate  nasal mucosa (+)erythema  Skin:  Normal inspection, no rash or concerning lesions noted on limited exam  Neck:   No masses, trachea midline. normal lymph nodes  Respiratory:   Normal respiratory effort.   No  wheeze/rhonchi/rales  Cardiovascular:   S1/S2 normal, no murmur/rub/gallop auscultated. RRR.     ASSESSMENT/PLAN:  Acute frontal sinusitis, recurrence not specified - Plan: ipratropium (ATROVENT) 0.03 % nasal spray, amoxicillin-clavulanate (AUGMENTIN) 875-125 MG tablet  Elevated blood pressure reading - Previous blood pressures in normal range, plan to follow-up for recheck BP   Visit summary was printed for the patient with medications and pertinent instructions for patient to review. ER/RTC precautions reviewed. All questions answered. Return if symptoms worsen or fail to improve, and as restricted for visit to recheck blood pressure.

## 2016-03-12 NOTE — Patient Instructions (Signed)

## 2016-03-19 ENCOUNTER — Ambulatory Visit: Payer: BLUE CROSS/BLUE SHIELD

## 2016-04-08 ENCOUNTER — Other Ambulatory Visit: Payer: Self-pay | Admitting: Osteopathic Medicine

## 2016-04-08 DIAGNOSIS — J0110 Acute frontal sinusitis, unspecified: Secondary | ICD-10-CM

## 2016-04-09 ENCOUNTER — Encounter: Payer: Self-pay | Admitting: Sports Medicine

## 2016-04-09 ENCOUNTER — Ambulatory Visit (INDEPENDENT_AMBULATORY_CARE_PROVIDER_SITE_OTHER): Payer: BLUE CROSS/BLUE SHIELD | Admitting: Sports Medicine

## 2016-04-09 DIAGNOSIS — IMO0001 Reserved for inherently not codable concepts without codable children: Secondary | ICD-10-CM

## 2016-04-09 DIAGNOSIS — E6609 Other obesity due to excess calories: Secondary | ICD-10-CM

## 2016-04-09 DIAGNOSIS — H029 Unspecified disorder of eyelid: Secondary | ICD-10-CM

## 2016-04-09 DIAGNOSIS — H6123 Impacted cerumen, bilateral: Secondary | ICD-10-CM | POA: Diagnosis not present

## 2016-04-09 DIAGNOSIS — Z0000 Encounter for general adult medical examination without abnormal findings: Secondary | ICD-10-CM | POA: Diagnosis not present

## 2016-04-09 LAB — LIPID PANEL W/REFLEX DIRECT LDL
Cholesterol: 208 mg/dL — ABNORMAL HIGH (ref <200)
HDL: 37 mg/dL — ABNORMAL LOW (ref >40)
LDL-Cholesterol: 139 mg/dL — ABNORMAL HIGH
Non-HDL Cholesterol (Calc): 171 mg/dL — ABNORMAL HIGH (ref <130)
Total CHOL/HDL Ratio: 5.6 Ratio — ABNORMAL HIGH (ref <5.0)
Triglycerides: 186 mg/dL — ABNORMAL HIGH (ref <150)

## 2016-04-09 LAB — CBC
HCT: 42.2 % (ref 38.5–50.0)
Hemoglobin: 14.2 g/dL (ref 13.2–17.1)
MCH: 27.4 pg (ref 27.0–33.0)
MCHC: 33.6 g/dL (ref 32.0–36.0)
MCV: 81.5 fL (ref 80.0–100.0)
MPV: 11 fL (ref 7.5–12.5)
Platelets: 181 K/uL (ref 140–400)
RBC: 5.18 MIL/uL (ref 4.20–5.80)
RDW: 14.5 % (ref 11.0–15.0)
WBC: 6.1 K/uL (ref 3.8–10.8)

## 2016-04-09 LAB — COMPREHENSIVE METABOLIC PANEL WITH GFR
ALT: 50 U/L — ABNORMAL HIGH (ref 9–46)
AST: 36 U/L (ref 10–40)
Alkaline Phosphatase: 55 U/L (ref 40–115)
BUN: 16 mg/dL (ref 7–25)
Creat: 1.04 mg/dL (ref 0.60–1.35)
Glucose, Bld: 83 mg/dL (ref 65–99)
Sodium: 140 mmol/L (ref 135–146)
Total Bilirubin: 0.4 mg/dL (ref 0.2–1.2)
Total Protein: 7.6 g/dL (ref 6.1–8.1)

## 2016-04-09 LAB — TSH: TSH: 1.51 m[IU]/L (ref 0.40–4.50)

## 2016-04-09 LAB — COMPREHENSIVE METABOLIC PANEL
Albumin: 4.5 g/dL (ref 3.6–5.1)
CO2: 29 mmol/L (ref 20–31)
Calcium: 9 mg/dL (ref 8.6–10.3)
Chloride: 103 mmol/L (ref 98–110)
Potassium: 4.2 mmol/L (ref 3.5–5.3)

## 2016-04-09 LAB — HEMOGLOBIN A1C
Hgb A1c MFr Bld: 5.5 % (ref <5.7)
Mean Plasma Glucose: 111 mg/dL

## 2016-04-09 MED ORDER — LORCASERIN HCL ER 20 MG PO TB24: 1.0000 | ORAL_TABLET | Freq: Every day | ORAL | 0 refills | Status: DC

## 2016-04-09 NOTE — Progress Notes (Signed)
  Subjective:    CC: Annual physical  HPI:  This is a pleasant 46 year old male here for his physical, he has no plans with the exception of a lesion at the medial canthus of his left eye, asymptomatic, as well as his weight.  Past medical history:  Negative.  See flowsheet/record as well for more information.  Surgical history: Negative.  See flowsheet/record as well for more information.  Family history: Negative.  See flowsheet/record as well for more information.  Social history: Negative.  See flowsheet/record as well for more information.  Allergies, and medications have been entered into the medical record, reviewed, and no changes needed.    Review of Systems: No headache, visual changes, nausea, vomiting, diarrhea, constipation, dizziness, abdominal pain, skin rash, fevers, chills, night sweats, swollen lymph nodes, weight loss, chest pain, body aches, joint swelling, muscle aches, shortness of breath, mood changes, visual or auditory hallucinations.  Objective:    General: Well Developed, well nourished, and in no acute distress.  Neuro: Alert and oriented x3, extra-ocular muscles intact, sensation grossly intact. Cranial nerves II through XII are intact, motor, sensory, and coordinative functions are all intact. HEENT: Normocephalic, atraumatic, pupils equal round reactive to light, neck supple, no masses, no lymphadenopathy, thyroid nonpalpable. Oropharynx, nasopharynx  unremarkable, bilateral cerumen impactions. There does appear to be a cyst versus a skin tag at the medial canthus of his left eye. Skin: Warm and dry, no rashes noted.  Cardiac: Regular rate and rhythm, no murmurs rubs or gallops.  Respiratory: Clear to auscultation bilaterally. Not using accessory muscles, speaking in full sentences.  Abdominal: Soft, nontender, nondistended, positive bowel sounds, no masses, no organomegaly.  Musculoskeletal: Shoulder, elbow, wrist, hip, knee, ankle stable, and with full range  of motion.  Indication: Cerumen impaction of both ear(s) Medical necessity statement: On physical examination, cerumen impairs clinically significant portions of the external auditory canal, and tympanic membrane. Noted obstructive, copious cerumen that cannot be removed without magnification and instrumentations requiring physician skills Consent: Discussed benefits and risks of procedure and verbal consent obtained Procedure: Patient was prepped for the procedure. Utilized an otoscope to assess and take note of the ear canal, the tympanic membrane, and the presence, amount, and placement of the cerumen. Gentle water irrigation was utilized to remove cerumen.  Post procedure examination: shows cerumen was completely removed. Patient tolerated procedure well. The patient is made aware that they may experience temporary vertigo, temporary hearing loss, and temporary discomfort. If these symptom last for more than 24 hours to call the clinic or proceed to the ED.  Impression and Recommendations:    The patient was counselled, risk factors were discussed, anticipatory guidance given.  Annual physical exam Annual physical as above. Checking routine blood work.  Obesity Adding Belviq. Patient was counseled on dieting, will be counting calories, and exercising. Return in one month.  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 Referral to oculofacial plastics.

## 2016-04-09 NOTE — Assessment & Plan Note (Signed)
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 Referral to oculofacial plastics.

## 2016-04-09 NOTE — Assessment & Plan Note (Signed)
Annual physical as above. Checking routine blood work.

## 2016-04-09 NOTE — Assessment & Plan Note (Signed)
Adding Belviq. Patient was counseled on dieting, will be counting calories, and exercising. Return in one month.

## 2016-04-09 NOTE — Addendum Note (Signed)
Addended by: Silverio Decamp on: 04/09/2016 09:46 AM   Modules accepted: Orders

## 2016-04-10 LAB — HIV ANTIBODY (ROUTINE TESTING W REFLEX): HIV 1&2 Ab, 4th Generation: NONREACTIVE

## 2016-04-10 LAB — VITAMIN D 25 HYDROXY (VIT D DEFICIENCY, FRACTURES): Vit D, 25-Hydroxy: 12 ng/mL — ABNORMAL LOW (ref 30–100)

## 2016-04-10 MED ORDER — VITAMIN D (ERGOCALCIFEROL) 1.25 MG (50000 UNIT) PO CAPS: 50000.0000 [IU] | ORAL_CAPSULE | ORAL | 0 refills | Status: DC

## 2016-04-10 NOTE — Addendum Note (Signed)
Addended by: Silverio Decamp on: 04/10/2016 12:04 PM   Modules accepted: Orders

## 2016-05-04 DIAGNOSIS — H04222 Epiphora due to insufficient drainage, left lacrimal gland: Secondary | ICD-10-CM | POA: Diagnosis not present

## 2016-05-04 DIAGNOSIS — D2312 Other benign neoplasm of skin of left eyelid, including canthus: Secondary | ICD-10-CM | POA: Diagnosis not present

## 2016-05-04 DIAGNOSIS — H0289 Other specified disorders of eyelid: Secondary | ICD-10-CM | POA: Diagnosis not present

## 2016-05-04 DIAGNOSIS — D485 Neoplasm of uncertain behavior of skin: Secondary | ICD-10-CM | POA: Diagnosis not present

## 2016-05-07 ENCOUNTER — Ambulatory Visit: Payer: BLUE CROSS/BLUE SHIELD | Admitting: Sports Medicine

## 2016-05-18 DIAGNOSIS — H04562 Stenosis of left lacrimal punctum: Secondary | ICD-10-CM | POA: Diagnosis not present

## 2016-05-18 DIAGNOSIS — D210 Benign neoplasm of connective and other soft tissue of head, face and neck: Secondary | ICD-10-CM | POA: Diagnosis not present

## 2016-05-26 ENCOUNTER — Ambulatory Visit: Payer: BLUE CROSS/BLUE SHIELD | Admitting: Sports Medicine

## 2016-05-29 ENCOUNTER — Encounter: Payer: Self-pay | Admitting: Emergency Medicine

## 2016-05-29 ENCOUNTER — Emergency Department
Admission: EM | Admit: 2016-05-29 | Discharge: 2016-05-29 | Disposition: A | Discharge status: Home / Self Care | Payer: BLUE CROSS/BLUE SHIELD | Source: Home / Self Care | Attending: Family Medicine | Admitting: Family Medicine

## 2016-05-29 DIAGNOSIS — J069 Acute upper respiratory infection, unspecified: Secondary | ICD-10-CM

## 2016-05-29 DIAGNOSIS — B9789 Other viral agents as the cause of diseases classified elsewhere: Secondary | ICD-10-CM

## 2016-05-29 DIAGNOSIS — J302 Other seasonal allergic rhinitis: Secondary | ICD-10-CM

## 2016-05-29 MED ORDER — PREDNISONE 20 MG PO TABS: ORAL_TABLET | ORAL | 0 refills | Status: DC

## 2016-05-29 MED ORDER — AZITHROMYCIN 250 MG PO TABS: ORAL_TABLET | ORAL | 0 refills | Status: DC

## 2016-05-29 NOTE — ED Triage Notes (Signed)
Sinus pain, pressure, congestion, productive cough with yellow sputum, started yesterday.

## 2016-05-29 NOTE — Discharge Instructions (Signed)
Take plain guaifenesin (1286m extended release tabs such as Mucinex) twice daily, with plenty of water, for cough and congestion.  May add Pseudoephedrine (368m one or two every 4 to 6 hours) for sinus congestion.  Get adequate rest.   May use Afrin nasal spray (or generic oxymetazoline) each morning for about 5 days and then discontinue.  Also recommend using saline nasal spray several times daily and saline nasal irrigation (AYR is a common brand).  Use Flonase nasal spray each morning after using Afrin nasal spray and saline nasal irrigation. Try warm salt water gargles for sore throat.  May take Delsym Cough Suppressant at bedtime for nighttime cough.  Stop all antihistamines for now, and other non-prescription cough/cold preparations. Begin Azithromycin if not improving about one week or if persistent fever develops

## 2016-05-29 NOTE — ED Provider Notes (Signed)
[facility]    CSN: [id number] Arrival date & time: 05/29/16  1448     History   Chief Complaint Chief Complaint  Patient presents with  . Sinus Problem    HPI Jerry Escobar is a 46 y.o. male.   Patient complains of 3 day history of increased sinus congestion, cough, mild sore throat, and headache.  He has a history of seasonal rhinitis that began to flare up about two weeks ago.   The history is provided by the patient.    Past Medical History:  Diagnosis Date  . Hyperlipidemia   . Hypertension   . Seasonal allergies   . Transaminitis     Patient Active Problem List   Diagnosis Date Noted  . Eyelid abnormality 04/09/2016  . Hyperlipidemia 01/16/2014  . Transaminitis 01/16/2014  . Prediabetes 01/16/2014  . Annual physical exam 01/15/2014  . Essential hypertension, benign 01/15/2014  . Obesity 01/15/2014  . Meralgia paresthetica of right side 11/27/2013  . Osteoarthritis of left hip 09/18/2013    Past Surgical History:  Procedure Laterality Date  . HERNIA REPAIR    . KNEE ARTHROSCOPY    . ROTATOR CUFF REPAIR         Home Medications    Prior to Admission medications   Medication Sig Start Date End Date Taking? Authorizing Provider  azithromycin (ZITHROMAX Z-PAK) 250 MG tablet Take 2 tabs today; then begin one tab once daily for 4 more days. (Rx void after 06/06/16) 05/29/16   Kandra Nicolas, MD  Lorcaserin HCl ER (BELVIQ XR) 20 MG TB24 Take 1 tablet by mouth daily. 04/09/16   Silverio Decamp, MD  Multiple Vitamins-Minerals (MULTIVITAMIN ADULT PO) Take 1 capsule by mouth daily.    [provider]  Omega-3 Fatty Acids (FISH OIL PO) Take 1 capsule by mouth daily.    [provider]  predniSONE (DELTASONE) 20 MG tablet Take one tab by mouth twice daily for 5 days, then one daily. Take with food. 05/29/16   Kandra Nicolas, MD  Vitamin D, Ergocalciferol, (DRISDOL) 50000 units CAPS capsule Take 1 capsule (50,000 Units  total) by mouth every 7 (seven) days. Take for 8 total doses(weeks) 04/10/16   Silverio Decamp, MD    Family History Family History  Problem Relation Age of Onset  . Cancer Father        prostate  . Breast cancer Paternal Grandmother     Social History Social History  Substance Use Topics  . Smoking status: Never Smoker  . Smokeless tobacco: Never Used  . Alcohol use 1.8 oz/week    3 Standard drinks or equivalent per week     Comment: Occasional     Allergies   Patient has no known allergies.   Review of Systems Review of Systems ? sore throat + cough No pleuritic pain No wheezing + nasal congestion + post-nasal drainage + sinus pain/pressure No itchy/red eyes No earache No hemoptysis No SOB No fever/chills No nausea No vomiting No abdominal pain No diarrhea No urinary symptoms No skin rash + fatigue No myalgias + headache Used OTC meds without relief   Physical Exam Triage Vital Signs ED Triage Vitals [05/29/16 1515]  Enc Vitals Group     BP (!) 158/120     Pulse Rate 82     Resp      Temp 98.5 F (36.9 C)     Temp Source Oral     SpO2 98 %  Weight 284 lb (128.8 kg)     Height 6' 3"  (1.905 m)     Head Circumference      Peak Flow      Pain Score 2     Pain Loc      Pain Edu?      Excl. in [place]?    No data found.   Updated Vital Signs BP (!) 158/120 (BP Location: Left Arm)   Pulse 82   Temp 98.5 F (36.9 C) (Oral)   Ht 6' 3"  (1.905 m)   Wt 284 lb (128.8 kg)   SpO2 98%   BMI 35.50 kg/m   Visual Acuity Right Eye Distance:   Left Eye Distance:   Bilateral Distance:    Right Eye Near:   Left Eye Near:    Bilateral Near:     Physical Exam Nursing notes and Vital Signs reviewed. Appearance:  Patient appears stated age, and in no acute distress Eyes:  Pupils are equal, round, and reactive to light and accomodation.  Extraocular movement is intact.  Conjunctivae are not inflamed  Ears:  Canals normal.  Tympanic membranes  normal.  Nose:  Congested turbinates.  No sinus tenderness.    Pharynx:  Normal Neck:  Supple.  Tender enlarged posterior/lateral nodes are palpated bilaterally  Lungs:  Clear to auscultation.  Breath sounds are equal.  Moving air well. Heart:  Regular rate and rhythm without murmurs, rubs, or gallops.  Abdomen:  Nontender without masses or hepatosplenomegaly.  Bowel sounds are present.  No CVA or flank tenderness.  Extremities:  No edema.  Skin:  No rash present.    UC Treatments / Results  Labs (all labs ordered are listed, but only abnormal results are displayed) Labs Reviewed - No data to display  EKG  EKG Interpretation None       Radiology No results found.  Procedures Procedures (including critical care time)  Medications Ordered in UC Medications - No data to display   Initial Impression / Assessment and Plan / UC Course  I have reviewed the triage vital signs and the nursing notes.  Pertinent labs & imaging results that were available during my care of the patient were reviewed by me and considered in my medical decision making (see chart for details).    There is no evidence of bacterial infection today.    He now has congestion of a viral URI superimposed on his usual seasonal rhinitis.    Begin prednisone burst taper. Take plain guaifenesin (1255m extended release tabs such as Mucinex) twice daily, with plenty of water, for cough and congestion.  May add Pseudoephedrine (36m one or two every 4 to 6 hours) for sinus congestion.  Get adequate rest. May take Delsym Cough Suppressant at bedtime for nighttime cough.    May use Afrin nasal spray (or generic oxymetazoline) each morning for about 5 days and then discontinue.  Also recommend using saline nasal spray several times daily and saline nasal irrigation (AYR is a common brand).  Use Flonase nasal spray each morning after using Afrin nasal spray and saline nasal irrigation. Try warm salt water gargles for  sore throat.  Stop all antihistamines for now, and other non-prescription cough/cold preparations. Begin Azithromycin if not improving about one week or if persistent fever develops (Given a prescription to hold, with an expiration date)     Final Clinical Impressions(s) / UC Diagnoses   Final diagnoses:  Viral URI with cough  Seasonal allergic rhinitis, unspecified trigger  New Prescriptions New Prescriptions   AZITHROMYCIN (ZITHROMAX Z-PAK) 250 MG TABLET    Take 2 tabs today; then begin one tab once daily for 4 more days. (Rx void after 06/06/16)   PREDNISONE (DELTASONE) 20 MG TABLET    Take one tab by mouth twice daily for 5 days, then one daily. Take with food.     Kandra Nicolas, MD 06/07/16 1028

## 2016-06-21 ENCOUNTER — Emergency Department
Admission: EM | Admit: 2016-06-21 | Discharge: 2016-06-21 | Disposition: A | Discharge status: Home / Self Care | Payer: BLUE CROSS/BLUE SHIELD | Source: Home / Self Care | Attending: Family Medicine | Admitting: Family Medicine

## 2016-06-21 ENCOUNTER — Encounter: Payer: Self-pay | Admitting: Emergency Medicine

## 2016-06-21 ENCOUNTER — Emergency Department (INDEPENDENT_AMBULATORY_CARE_PROVIDER_SITE_OTHER): Payer: BLUE CROSS/BLUE SHIELD

## 2016-06-21 DIAGNOSIS — R0781 Pleurodynia: Secondary | ICD-10-CM

## 2016-06-21 DIAGNOSIS — R0602 Shortness of breath: Secondary | ICD-10-CM | POA: Diagnosis not present

## 2016-06-21 MED ORDER — IBUPROFEN 600 MG PO TABS: 600.0000 mg | ORAL_TABLET | Freq: Four times a day (QID) | ORAL | 0 refills | Status: DC | PRN

## 2016-06-21 MED ORDER — CYCLOBENZAPRINE HCL 10 MG PO TABS: 10.0000 mg | ORAL_TABLET | Freq: Two times a day (BID) | ORAL | 0 refills | Status: DC | PRN

## 2016-06-21 NOTE — ED Provider Notes (Signed)
CSN: [id number]     Arrival date & time 06/21/16  1131 History   First MD Initiated Contact with Patient 06/21/16 1217     Chief Complaint  Patient presents with  . Pleurisy   (Consider location/radiation/quality/duration/timing/severity/associated sxs/prior Treatment) HPI Jerry Escobar is a 46 y.o. male presenting to UC with c/o Right side rib pain that has been intermittent but gradually worsening over the last 3 weeks.  Pain occasionally causes mild SOB.  Pain is sharp and aching but dull at this time.  He reports having a contusion to the same area a few years ago after hitting it on the corner of bleachers at his son's basketball game when a kid accidentally ran into him but he does not recall any new injuries. Denies fever, chills, cough or congestion. Ibuprofen does help with the pain. Denies heavy lifting or overhead reaching.    Past Medical History:  Diagnosis Date  . Hyperlipidemia   . Hypertension   . Seasonal allergies   . Transaminitis    Past Surgical History:  Procedure Laterality Date  . HERNIA REPAIR    . KNEE ARTHROSCOPY    . ROTATOR CUFF REPAIR     Family History  Problem Relation Age of Onset  . Cancer Father        prostate  . Breast cancer Paternal Grandmother    Social History  Substance Use Topics  . Smoking status: Never Smoker  . Smokeless tobacco: Never Used  . Alcohol use 1.8 oz/week    3 Standard drinks or equivalent per week     Comment: Occasional    Review of Systems  Constitutional: Negative for chills and fever.  HENT: Negative for congestion, ear pain, sore throat, trouble swallowing and voice change.   Respiratory: Positive for shortness of breath ( mild intermittent due to Right side pain). Negative for cough.   Cardiovascular: Positive for chest pain (Right side ribs). Negative for palpitations.  Gastrointestinal: Negative for abdominal pain, diarrhea, nausea and vomiting.  Musculoskeletal: Negative for arthralgias, back pain and  myalgias.  Skin: Negative for rash.    Allergies  Patient has no known allergies.  Home Medications   Prior to Admission medications   Medication Sig Start Date End Date Taking? Authorizing Provider  azithromycin (ZITHROMAX Z-PAK) 250 MG tablet Take 2 tabs today; then begin one tab once daily for 4 more days. (Rx void after 06/06/16) 05/29/16   Kandra Nicolas, MD  cyclobenzaprine (FLEXERIL) 10 MG tablet Take 1 tablet (10 mg total) by mouth 2 (two) times daily as needed. 06/21/16   Noland Fordyce, PA-C  ibuprofen (ADVIL,MOTRIN) 600 MG tablet Take 1 tablet (600 mg total) by mouth every 6 (six) hours as needed. 06/21/16   Noland Fordyce, PA-C  Lorcaserin HCl ER (BELVIQ XR) 20 MG TB24 Take 1 tablet by mouth daily. 04/09/16   Silverio Decamp, MD  Multiple Vitamins-Minerals (MULTIVITAMIN ADULT PO) Take 1 capsule by mouth daily.    [provider]  Omega-3 Fatty Acids (FISH OIL PO) Take 1 capsule by mouth daily.    [provider]  predniSONE (DELTASONE) 20 MG tablet Take one tab by mouth twice daily for 5 days, then one daily. Take with food. 05/29/16   Kandra Nicolas, MD  Vitamin D, Ergocalciferol, (DRISDOL) 50000 units CAPS capsule Take 1 capsule (50,000 Units total) by mouth every 7 (seven) days. Take for 8 total doses(weeks) 04/10/16   Silverio Decamp, MD   Meds Ordered and Administered this  Visit  Medications - No data to display  BP (!) 157/101 (BP Location: Left Arm)   Pulse 88   Temp 98.4 F (36.9 C) (Oral)   Resp 16   Ht 6' 3"  (1.905 m)   Wt 277 lb 4 oz (125.8 kg)   SpO2 97%   BMI 34.65 kg/m  No data found.   Physical Exam  Constitutional: He is oriented to person, place, and time. He appears well-developed and well-nourished. No distress.  HENT:  Head: Normocephalic and atraumatic.  Eyes: EOM are normal.  Neck: Normal range of motion.  Cardiovascular: Normal rate and regular rhythm.   Pulmonary/Chest: Effort normal and breath sounds normal. No  respiratory distress. He has no wheezes. He has no rales.     He exhibits tenderness.    Quarter sized area of tenderness to Right side ribs. No deformity or crepitus.  Lungs: CTAB  No respiratory distress.   Musculoskeletal: Normal range of motion.  Neurological: He is alert and oriented to person, place, and time.  Skin: Skin is warm and dry. He is not diaphoretic.  Psychiatric: He has a normal mood and affect. His behavior is normal.  Nursing note and vitals reviewed.   Urgent Care Course     Procedures (including critical care time)  Labs Review Labs Reviewed - No data to display  Imaging Review Dg Ribs Unilateral W/chest Right  Result Date: 06/21/2016 CLINICAL DATA:  Right chest and rib pain with shortness of breath for 3 weeks. EXAM: RIGHT RIBS AND CHEST - 3+ VIEW COMPARISON:  None. FINDINGS: No fracture or other bone lesions are seen involving the ribs. There is no evidence of pneumothorax or pleural effusion. Both lungs are clear. Heart size and mediastinal contours are within normal limits. IMPRESSION: Negative. Electronically Signed   By: Margarette Canada M.D.   On: 06/21/2016 12:21    MDM   1. Rib pain on right side      CXR unremarkable. Doubt PE- no respiratory distress, O2 Sat 97% on RA Doubt ACS  Pain likely musculoskeletal in nature  Rx: flexeril and ibuprofen Home care instructions provided F/u with PCP in 1 week if not improving, sooner if worsening.     Noland Fordyce, PA-C 06/21/16 1236    Noland Fordyce, PA-C 06/21/16 1237

## 2016-06-21 NOTE — Discharge Instructions (Signed)
°  Cyclobenzaprine is a muscle relaxer and may cause drowsiness. Do not drink alcohol, drive, or operate heavy machinery while taking.  You may also take ibuprofen at the same time. Alternate cool and warm compresses for comfort.

## 2016-06-21 NOTE — ED Triage Notes (Signed)
Patient presents to [facility] with pain2/10 in the right rib area, times 3 weeks , denies injury. Pain is intermittent and occasionally causes SOB. Resp. Even and unlabored Sats 97% on room air.

## 2016-07-01 ENCOUNTER — Ambulatory Visit (INDEPENDENT_AMBULATORY_CARE_PROVIDER_SITE_OTHER): Payer: BLUE CROSS/BLUE SHIELD | Admitting: Sports Medicine

## 2016-07-01 ENCOUNTER — Encounter: Payer: Self-pay | Admitting: Sports Medicine

## 2016-07-01 DIAGNOSIS — R0789 Other chest pain: Secondary | ICD-10-CM | POA: Diagnosis not present

## 2016-07-01 NOTE — Assessment & Plan Note (Signed)
I can feel a fullness in the subcutaneous tissues at the most painful area. There does appear to be somewhat of a diffuse type lipoma. This is injected under ultrasound guidance, return in one month. If insufficient relief we'll proceed with MRI of the side of his chest with IV contrast. I'm also going to add a chest wall binder.

## 2016-07-01 NOTE — Progress Notes (Signed)
  Subjective:    CC:  Right chest wall pain  HPI: For one month without trauma this pleasant 46 year old male has had pain that he localizes under his armpit on the right side, over the lateral ribs, worse with palpation, worse with taking deep breaths. He was seen in urgent care where x-rays were negative, his vital signs were stable, his pain is reproducible on palpation making PE less likely. He continues to have pain. Moderate, persistent, localized without radiation.  Past medical history:  Negative.  See flowsheet/record as well for more information.  Surgical history: Negative.  See flowsheet/record as well for more information.  Family history: Negative.  See flowsheet/record as well for more information.  Social history: Negative.  See flowsheet/record as well for more information.  Allergies, and medications have been entered into the medical record, reviewed, and no changes needed.   Review of Systems: No fevers, chills, night sweats, weight loss, chest pain, or shortness of breath.   Objective:    General: Well Developed, well nourished, and in no acute distress.  Neuro: Alert and oriented x3, extra-ocular muscles intact, sensation grossly intact.  HEENT: Normocephalic, atraumatic, pupils equal round reactive to light, neck supple, no masses, no lymphadenopathy, thyroid nonpalpable.  Skin: Warm and dry, no rashes. Cardiac: Regular rate and rhythm, no murmurs rubs or gallops, no lower extremity edema.  Respiratory: Clear to auscultation bilaterally. Not using accessory muscles, speaking in full sentences. Chest wall: Tender palpation in the anterior axillary line approximately over the fifth or sixth rib, there is a palpable small mass that feels to be subcutaneous, when the mass is post out of the way he is no longer tender over these ribs.  Procedure: Real-time Ultrasound Guided Injection of right subcutaneous chest wall mass Device: GE Logiq E  Verbal informed consent  obtained.  Time-out conducted.  Noted no overlying erythema, induration, or other signs of local infection.  Skin prepped in a sterile fashion.  Local anesthesia: Topical Ethyl chloride.  With sterile technique and under real time ultrasound guidance:   Noted underlying prominence of the subcutaneous tissues isoechoic with surrounding subcutaneous tissue and consistent with lipoma, using a 22-gauge spinal needle  I injected 1 mL Kenalog 40, 2 mL lidocaine, 2 mL bupivacaine in a fanlike pattern into the mass. Completed without difficulty  Pain immediately resolved suggesting accurate placement of the medication.  Advised to call if fevers/chills, erythema, induration, drainage, or persistent bleeding.  Images permanently stored and available for review in the ultrasound unit.  Impression: Technically successful ultrasound guided injection.  The chest was then strapped with compressive dressing.  Impression and Recommendations:    Right-sided chest wall pain  I can feel a fullness in the subcutaneous tissues at the most painful area. There does appear to be somewhat of a diffuse type lipoma. This is injected under ultrasound guidance, return in one month. If insufficient relief we'll proceed with MRI of the side of his chest with IV contrast. I'm also going to add a chest wall binder.

## 2016-07-29 ENCOUNTER — Ambulatory Visit: Payer: BLUE CROSS/BLUE SHIELD | Admitting: Sports Medicine

## 2016-07-29 DIAGNOSIS — Z0189 Encounter for other specified special examinations: Secondary | ICD-10-CM

## 2016-11-24 ENCOUNTER — Encounter: Payer: Self-pay | Admitting: Sports Medicine

## 2016-11-24 ENCOUNTER — Ambulatory Visit: Payer: BLUE CROSS/BLUE SHIELD | Admitting: Sports Medicine

## 2016-11-24 ENCOUNTER — Ambulatory Visit (INDEPENDENT_AMBULATORY_CARE_PROVIDER_SITE_OTHER): Payer: BLUE CROSS/BLUE SHIELD

## 2016-11-24 DIAGNOSIS — M5137 Other intervertebral disc degeneration, lumbosacral region: Secondary | ICD-10-CM | POA: Insufficient documentation

## 2016-11-24 DIAGNOSIS — M545 Low back pain, unspecified: Secondary | ICD-10-CM

## 2016-11-24 DIAGNOSIS — I10 Essential (primary) hypertension: Secondary | ICD-10-CM | POA: Diagnosis not present

## 2016-11-24 DIAGNOSIS — M5136 Other intervertebral disc degeneration, lumbar region: Secondary | ICD-10-CM | POA: Insufficient documentation

## 2016-11-24 DIAGNOSIS — M5416 Radiculopathy, lumbar region: Secondary | ICD-10-CM | POA: Diagnosis not present

## 2016-11-24 DIAGNOSIS — M51379 Other intervertebral disc degeneration, lumbosacral region without mention of lumbar back pain or lower extremity pain: Secondary | ICD-10-CM | POA: Insufficient documentation

## 2016-11-24 MED ORDER — IBUPROFEN 800 MG PO TABS: 800.0000 mg | ORAL_TABLET | Freq: Three times a day (TID) | ORAL | 2 refills | Status: DC | PRN

## 2016-11-24 NOTE — Assessment & Plan Note (Signed)
Pain with extension, gelling, likely facet arthritis. Starting conservatively, ibuprofen 3 times per day, x-rays, home rehab exercises. Return in 1 month, MR for interventional planning if no better.

## 2016-11-24 NOTE — Assessment & Plan Note (Signed)
Blood pressure is very elevated today, no headaches, visual changes, chest pain, he is in pain with regards to his back. We will treat his back pain aggressively and recheck the blood pressure at his follow-up visit.

## 2016-11-24 NOTE — Progress Notes (Signed)
  Subjective:    CC: Low back pain  HPI: Jerry Escobar is a pleasant 45 year old male, for the past several weeks he has had pain that he localizes in the left side of his low back with radiation to the buttock and left thigh.  Worse with standing, twisting, stiffness in the morning, nothing radicular, no bowel or bladder dysfunction, saddle numbness, no constitutional symptoms, no trauma.  Blood pressure is very elevated today, no headaches, visual changes, chest pain, he is in pain with regards to his back.  Past medical history:  Negative.  See flowsheet/record as well for more information.  Surgical history: Negative.  See flowsheet/record as well for more information.  Family history: Negative.  See flowsheet/record as well for more information.  Social history: Negative.  See flowsheet/record as well for more information.  Allergies, and medications have been entered into the medical record, reviewed, and no changes needed.   Review of Systems: No fevers, chills, night sweats, weight loss, chest pain, or shortness of breath.   Objective:    General: Well Developed, well nourished, and in no acute distress.  Neuro: Alert and oriented x3, extra-ocular muscles intact, sensation grossly intact.  HEENT: Normocephalic, atraumatic, pupils equal round reactive to light, neck supple, no masses, no lymphadenopathy, thyroid nonpalpable.  Skin: Warm and dry, no rashes. Cardiac: Regular rate and rhythm, no murmurs rubs or gallops, no lower extremity edema.  Respiratory: Clear to auscultation bilaterally. Not using accessory muscles, speaking in full sentences. Back Exam:  Inspection: Unremarkable  Motion: Flexion 45 deg, Extension 45 deg, Side Bending to 45 deg bilaterally,  Rotation to 45 deg bilaterally  SLR laying: Negative  XSLR laying: Negative  Palpable tenderness: None. FABER: negative. Sensory change: Gross sensation intact to all lumbar and sacral dermatomes.  Reflexes: 2+ at both  patellar tendons, 2+ at achilles tendons, Babinski's downgoing.  Strength at foot  Plantar-flexion: 5/5 Dorsi-flexion: 5/5 Eversion: 5/5 Inversion: 5/5  Leg strength  Quad: 5/5 Hamstring: 5/5 Hip flexor: 5/5 Hip abductors: 5/5  Gait unremarkable.  Impression and Recommendations:    Acute left-sided low back pain without sciatica Pain with extension, gelling, likely facet arthritis. Starting conservatively, ibuprofen 3 times per day, x-rays, home rehab exercises. Return in 1 month, MR for interventional planning if no better.  Essential hypertension, benign Blood pressure is very elevated today, no headaches, visual changes, chest pain, he is in pain with regards to his back. We will treat his back pain aggressively and recheck the blood pressure at his follow-up visit.  I spent 25 minutes with this patient, greater than 50% was face-to-face time counseling regarding the above diagnoses ___________________________________________ Gwen Her. Dianah Field, M.D., ABFM., CAQSM. Primary Care and [place] Instructor of [place] of [facility]

## 2016-12-01 ENCOUNTER — Ambulatory Visit: Payer: BLUE CROSS/BLUE SHIELD | Admitting: Sports Medicine

## 2016-12-01 ENCOUNTER — Encounter: Payer: Self-pay | Admitting: Sports Medicine

## 2016-12-01 DIAGNOSIS — M545 Low back pain, unspecified: Secondary | ICD-10-CM

## 2016-12-01 DIAGNOSIS — M1612 Unilateral primary osteoarthritis, left hip: Secondary | ICD-10-CM | POA: Diagnosis not present

## 2016-12-01 DIAGNOSIS — I10 Essential (primary) hypertension: Secondary | ICD-10-CM

## 2016-12-01 MED ORDER — LISINOPRIL-HYDROCHLOROTHIAZIDE 20-25 MG PO TABS: ORAL_TABLET | ORAL | 3 refills | Status: DC

## 2016-12-01 NOTE — Assessment & Plan Note (Addendum)
Previous injection in the joint worked for 3 years, repeat left hip joint injection. Return in 1 month.

## 2016-12-01 NOTE — Assessment & Plan Note (Signed)
Starting lisinopril/HCTZ max dose

## 2016-12-01 NOTE — Progress Notes (Signed)
  Subjective:    CC: Left hip pain  HPI: This is a pleasant 46 year old male with known hip osteoarthritis, previous injection was 3 years ago, he did well, having recurrence of pain localized on the anterolateral hip, worse with weightbearing with stiffness in the morning.  Low back pain: Improved considerably with conservative measures, he was out of work and needs some disability paperwork filled out.  Elevated blood pressure: Persistently elevated, no headaches, visual changes, chest pain.  Past medical history:  Negative.  See flowsheet/record as well for more information.  Surgical history: Negative.  See flowsheet/record as well for more information.  Family history: Negative.  See flowsheet/record as well for more information.  Social history: Negative.  See flowsheet/record as well for more information.  Allergies, and medications have been entered into the medical record, reviewed, and no changes needed.   Review of Systems: No fevers, chills, night sweats, weight loss, chest pain, or shortness of breath.   Objective:    General: Well Developed, well nourished, and in no acute distress.  Neuro: Alert and oriented x3, extra-ocular muscles intact, sensation grossly intact.  HEENT: Normocephalic, atraumatic, pupils equal round reactive to light, neck supple, no masses, no lymphadenopathy, thyroid nonpalpable.  Skin: Warm and dry, no rashes. Cardiac: Regular rate and rhythm, no murmurs rubs or gallops, no lower extremity edema.  Respiratory: Clear to auscultation bilaterally. Not using accessory muscles, speaking in full sentences. Left hip: ROM IR: 45 degrees with pain reproduced, ER: 60 Deg, Flexion: 120 Deg, Extension: 100 Deg, Abduction: 45 Deg, Adduction: 45 Deg Strength IR: 5/5, ER: 5/5, Flexion: 5/5, Extension: 5/5, Abduction: 5/5, Adduction: 5/5 Pelvic alignment unremarkable to inspection and palpation. Standing hip rotation and gait without trendelenburg /  unsteadiness. Greater trochanter without tenderness to palpation. No tenderness over piriformis. No SI joint tenderness and normal minimal SI movement.  Procedure: Real-time Ultrasound Guided Injection of left hip joint Device: GE Logiq E  Verbal informed consent obtained.  Time-out conducted.  Noted no overlying erythema, induration, or other signs of local infection.  Skin prepped in a sterile fashion.  Local anesthesia: Topical Ethyl chloride.  With sterile technique and under real time ultrasound guidance: Using a 22-gauge spinal needle advanced to the femoral head/neck junction, I contacted the bone and then injected 1 cc kenalog 40, 2 cc lidocaine, 2 cc bupivacaine. Completed without difficulty  Pain immediately resolved suggesting accurate placement of the medication.  Advised to call if fevers/chills, erythema, induration, drainage, or persistent bleeding.  Images permanently stored and available for review in the ultrasound unit.  Impression: Technically successful ultrasound guided injection.  Impression and Recommendations:    Osteoarthritis of left hip Previous injection in the joint worked for 3 years, repeat left hip joint injection. Return in 1 month.  Acute left-sided low back pain without sciatica Gelling, pain with extension likely related to facet mediated arthritis pain. That is better after ibuprofen, rehab exercises, disability paperwork filled out today keeping him out until December 4. He will continue his rehab exercises for now.  Essential hypertension, benign Starting lisinopril/HCTZ max dose  ___________________________________________ Gwen Her. Dianah Field, M.D., ABFM., CAQSM. Primary Care and [place] Instructor of [place] of [facility] of [employer]

## 2016-12-01 NOTE — Assessment & Plan Note (Signed)
Gelling, pain with extension likely related to facet mediated arthritis pain. That is better after ibuprofen, rehab exercises, disability paperwork filled out today keeping him out until December 4. He will continue his rehab exercises for now.

## 2016-12-15 ENCOUNTER — Ambulatory Visit (INDEPENDENT_AMBULATORY_CARE_PROVIDER_SITE_OTHER): Payer: BLUE CROSS/BLUE SHIELD | Admitting: Sports Medicine

## 2016-12-15 DIAGNOSIS — I10 Essential (primary) hypertension: Secondary | ICD-10-CM

## 2016-12-15 MED ORDER — METOPROLOL SUCCINATE ER 50 MG PO TB24: 50.0000 mg | ORAL_TABLET | Freq: Every day | ORAL | 3 refills | Status: DC

## 2016-12-15 NOTE — Assessment & Plan Note (Signed)
Blood pressure is getting close to well-controlled on current regimen, still somewhat tachycardic with a high diastolic, adding Toprol-XL. Return in 2 weeks to recheck.

## 2016-12-15 NOTE — Progress Notes (Signed)
   Subjective:    Patient ID: Jerry Escobar, male    DOB: 1970/09/24, 46 y.o.   MRN: [id number]  HPI Patient here for 2 week check after initiating anti-hypertensive rx; states no side effects from this rx. He has already started eating healthier and has lost 4 pounds in interim.    Review of Systems     Objective:   Physical Exam        Assessment & Plan:  Patient's findings reviewed with Dr.T; he said to tell patient he will be calling in a 2nd rx for BP; would like to check his BP in another 2 weeks.

## 2016-12-22 ENCOUNTER — Encounter: Payer: Self-pay | Admitting: Sports Medicine

## 2016-12-22 ENCOUNTER — Telehealth: Payer: Self-pay | Admitting: Sports Medicine

## 2016-12-22 ENCOUNTER — Ambulatory Visit (INDEPENDENT_AMBULATORY_CARE_PROVIDER_SITE_OTHER): Payer: BLUE CROSS/BLUE SHIELD | Admitting: Sports Medicine

## 2016-12-22 DIAGNOSIS — I10 Essential (primary) hypertension: Secondary | ICD-10-CM

## 2016-12-22 MED ORDER — METOPROLOL SUCCINATE ER 100 MG PO TB24: 100.0000 mg | ORAL_TABLET | Freq: Every day | ORAL | 3 refills | Status: DC

## 2016-12-22 NOTE — Telephone Encounter (Signed)
Pt seen this morning and forgot to get a note to return to work for [employer]. Call when ready and he will pick up. 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

## 2016-12-22 NOTE — Telephone Encounter (Signed)
Pt notified work note is ready and can pick up at front office. KG LPN

## 2016-12-22 NOTE — Progress Notes (Signed)
  Subjective:    CC: Blood pressure check  HPI: This is a pleasant 47 year old male with hypertension, he took his blood pressure medicine today but had not taken it yesterday.  No headaches, visual changes, chest pain, here for a blood pressure check, it was 120/90 at a nurse visit about 2 months ago.  Past medical history:  Negative.  See flowsheet/record as well for more information.  Surgical history: Negative.  See flowsheet/record as well for more information.  Family history: Negative.  See flowsheet/record as well for more information.  Social history: Negative.  See flowsheet/record as well for more information.  Allergies, and medications have been entered into the medical record, reviewed, and no changes needed.   Review of Systems: No fevers, chills, night sweats, weight loss, chest pain, or shortness of breath.   Objective:    General: Well Developed, well nourished, and in no acute distress.  Neuro: Alert and oriented x3, extra-ocular muscles intact, sensation grossly intact.  HEENT: Normocephalic, atraumatic, pupils equal round reactive to light, neck supple, no masses, no lymphadenopathy, thyroid nonpalpable.  Skin: Warm and dry, no rashes. Cardiac: Regular rate and rhythm, no murmurs rubs or gallops, no lower extremity edema.  Respiratory: Clear to auscultation bilaterally. Not using accessory muscles, speaking in full sentences.  Impression and Recommendations:    Essential hypertension, benign Still elevated on high-dose lisinopril/HCTZ, Toprol-XL, pulse rate is in the upper 90s. He will return for a nurse visit in the afternoon, and I am increasing his Toprol-XL to 100 mg daily.  I spent 25 minutes with this patient, greater than 50% was face-to-face time counseling regarding the above diagnoses ___________________________________________ Gwen Her. Dianah Field, M.D., ABFM., CAQSM. Primary Care and [place]  Instructor of [place] of [facility] of [employer]

## 2016-12-22 NOTE — Telephone Encounter (Signed)
Letter written, I will bring it to you.

## 2016-12-22 NOTE — Assessment & Plan Note (Signed)
Still elevated on high-dose lisinopril/HCTZ, Toprol-XL, pulse rate is in the upper 90s. He will return for a nurse visit in the afternoon, and I am increasing his Toprol-XL to 100 mg daily.

## 2016-12-29 ENCOUNTER — Ambulatory Visit: Payer: BLUE CROSS/BLUE SHIELD

## 2017-01-05 ENCOUNTER — Ambulatory Visit (INDEPENDENT_AMBULATORY_CARE_PROVIDER_SITE_OTHER): Payer: BLUE CROSS/BLUE SHIELD | Admitting: Sports Medicine

## 2017-01-05 VITALS — BP 155/91 | HR 68 | Resp 68 | Wt 283.0 lb

## 2017-01-05 DIAGNOSIS — I10 Essential (primary) hypertension: Secondary | ICD-10-CM

## 2017-01-05 MED ORDER — AMLODIPINE BESYLATE 5 MG PO TABS: 5.0000 mg | ORAL_TABLET | Freq: Every day | ORAL | 1 refills | Status: DC

## 2017-01-05 NOTE — Progress Notes (Signed)
   Subjective:    Patient ID: Jerry Escobar, male    DOB: July 16, 1970, 46 y.o.   MRN: [id number]  HPI  Jerry Escobar is here for a blood pressure check. Denies shortness of breath or headaches. He did have some chest tightness but believes it is due to the stress he is having at the moment.   Review of Systems     Objective:   Physical Exam        Assessment & Plan:  Hypertension - Per Dr Jerry Escobar, start amlodipine 5 mg once daily along with other blood pressure. Follow up in 2 weeks for blood pressure check.

## 2017-01-05 NOTE — Assessment & Plan Note (Signed)
Blood pressure was still elevated on high-dose lisinopril/HCTZ, Toprol-XL, we increased his Toprol-XL to 100 mg daily, blood pressure continues to be elevated but pulse rate has dropped nicely. Adding 5 mg of amlodipine, return in 2 weeks for a blood pressure check.

## 2017-01-28 ENCOUNTER — Ambulatory Visit: Payer: BLUE CROSS/BLUE SHIELD | Admitting: Sports Medicine

## 2017-01-28 ENCOUNTER — Encounter: Payer: Self-pay | Admitting: Sports Medicine

## 2017-01-28 DIAGNOSIS — I10 Essential (primary) hypertension: Secondary | ICD-10-CM

## 2017-01-28 MED ORDER — AMLODIPINE BESYLATE 10 MG PO TABS: 10.0000 mg | ORAL_TABLET | Freq: Every day | ORAL | 3 refills | Status: DC

## 2017-01-28 NOTE — Progress Notes (Signed)
  Subjective:    CC: Recheck blood pressure  HPI: Jerry Escobar returns, he is on metoprolol, and lisinopril/HCTZ, we added 5 mg of amlodipine at the last nurse visit, blood pressure is improved considerably, nearly well-controlled.  He is also noted that decreasing some of his response abilities and stress at home has improved his symptoms.  I reviewed the past medical history, family history, social history, surgical history, and allergies today and no changes were needed.  Please see the problem list section below in epic for further details.  Past Medical History: Past Medical History:  Diagnosis Date  . Hyperlipidemia   . Hypertension   . Seasonal allergies   . Transaminitis    Past Surgical History: Past Surgical History:  Procedure Laterality Date  . HERNIA REPAIR    . KNEE ARTHROSCOPY    . ROTATOR CUFF REPAIR     Social History: Social History   Socioeconomic History  . Marital status: Single    Spouse name: None  . Number of children: 3  . Years of education: None  . Highest education level: None  Social Needs  . Financial resource strain: None  . Food insecurity - worry: None  . Food insecurity - inability: None  . Transportation needs - medical: None  . Transportation needs - non-medical: None  Occupational History  . None  Tobacco Use  . Smoking status: Never Smoker  . Smokeless tobacco: Never Used  Substance and Sexual Activity  . Alcohol use: Yes    Alcohol/week: 1.8 oz    Types: 3 Standard drinks or equivalent per week    Comment: Occasional  . Drug use: No  . Sexual activity: None  Other Topics Concern  . None  Social History Narrative  . None   Family History: Family History  Problem Relation Age of Onset  . Cancer Father        prostate  . Breast cancer Paternal Grandmother    Allergies: No Known Allergies Medications: See med rec.  Review of Systems: No fevers, chills, night sweats, weight loss, chest pain, or shortness of breath.    Objective:    General: Well Developed, well nourished, and in no acute distress.  Neuro: Alert and oriented x3, extra-ocular muscles intact, sensation grossly intact.  HEENT: Normocephalic, atraumatic, pupils equal round reactive to light, neck supple, no masses, no lymphadenopathy, thyroid nonpalpable.  Skin: Warm and dry, no rashes. Cardiac: Regular rate and rhythm, no murmurs rubs or gallops, no lower extremity edema.  Respiratory: Clear to auscultation bilaterally. Not using accessory muscles, speaking in full sentences.  Impression and Recommendations:    Essential hypertension, benign Pressure has improved considerably, nearly perfectly controlled with the addition of 5 of amlodipine. Increasing to 10 mg of amlodipine, return in 2 weeks for a nurse visit blood pressure check.  ___________________________________________ Jerry Escobar. Dianah Field, M.D., ABFM., CAQSM. Primary Care and [place] Instructor of [place] of [facility] [employer]

## 2017-01-28 NOTE — Assessment & Plan Note (Signed)
Pressure has improved considerably, nearly perfectly controlled with the addition of 5 of amlodipine. Increasing to 10 mg of amlodipine, return in 2 weeks for a nurse visit blood pressure check.

## 2017-02-11 ENCOUNTER — Encounter: Payer: Self-pay | Admitting: Emergency Medicine

## 2017-02-11 ENCOUNTER — Ambulatory Visit (INDEPENDENT_AMBULATORY_CARE_PROVIDER_SITE_OTHER): Payer: BLUE CROSS/BLUE SHIELD | Admitting: Sports Medicine

## 2017-02-11 ENCOUNTER — Emergency Department
Admission: EM | Admit: 2017-02-11 | Discharge: 2017-02-11 | Disposition: A | Discharge status: Home / Self Care | Payer: BLUE CROSS/BLUE SHIELD | Source: Home / Self Care | Attending: Family Medicine | Admitting: Family Medicine

## 2017-02-11 ENCOUNTER — Other Ambulatory Visit: Payer: Self-pay

## 2017-02-11 VITALS — BP 135/86 | HR 67 | Temp 97.7°F | Resp 16 | Wt 287.2 lb

## 2017-02-11 DIAGNOSIS — J069 Acute upper respiratory infection, unspecified: Secondary | ICD-10-CM

## 2017-02-11 DIAGNOSIS — B9789 Other viral agents as the cause of diseases classified elsewhere: Secondary | ICD-10-CM

## 2017-02-11 DIAGNOSIS — I10 Essential (primary) hypertension: Secondary | ICD-10-CM

## 2017-02-11 MED ORDER — DOXYCYCLINE HYCLATE 100 MG PO CAPS: 100.0000 mg | ORAL_CAPSULE | Freq: Two times a day (BID) | ORAL | 0 refills | Status: DC

## 2017-02-11 MED ORDER — BENZONATATE 200 MG PO CAPS: ORAL_CAPSULE | ORAL | 0 refills | Status: DC

## 2017-02-11 NOTE — ED Triage Notes (Signed)
Reports nasal and chest congestion with sore throat and no fever for past 4 days. Has tried OTCs but none since 0530.

## 2017-02-11 NOTE — Discharge Instructions (Signed)
Take plain guaifenesin (1212m extended release tabs such as Mucinex) twice daily, with plenty of water, for cough and congestion.  Get adequate rest.   May use Afrin nasal spray (or generic oxymetazoline) each morning for about 5 days and then discontinue.  Also recommend using saline nasal spray several times daily and saline nasal irrigation (AYR is a common brand).  Use Flonase nasal spray each morning after using Afrin nasal spray and saline nasal irrigation. Try warm salt water gargles for sore throat.  Stop all antihistamines for now, and other non-prescription cough/cold preparations. May take Ibuprofen 2043m 4 tabs every 8 hours with food for headache, body aches, etc. Begin Doxycycline if not improving about one week or if persistent fever develops

## 2017-02-11 NOTE — ED Provider Notes (Signed)
[facility]    CSN: [id number] Arrival date & time: 02/11/17  1429     History   Chief Complaint Chief Complaint  Patient presents with  . Nasal Congestion  . Sore Throat    HPI Jerry Escobar is a 47 y.o. male.   Patient complains of five day history of typical cold-like symptoms developing over several days, including mild sore throat, sinus congestion, headache, fatigue, and cough. He denies fevers, chills, and sweats.   The history is provided by the patient.    Past Medical History:  Diagnosis Date  . Hyperlipidemia   . Hypertension   . Seasonal allergies   . Transaminitis     Patient Active Problem List   Diagnosis Date Noted  . Acute left-sided low back pain without sciatica 11/24/2016  . Right-sided chest wall pain 07/01/2016  . Eyelid abnormality 04/09/2016  . Hyperlipidemia 01/16/2014  . Transaminitis 01/16/2014  . Prediabetes 01/16/2014  . Annual physical exam 01/15/2014  . Essential hypertension, benign 01/15/2014  . Obesity 01/15/2014  . Meralgia paresthetica of right side 11/27/2013  . Osteoarthritis of left hip 09/18/2013    Past Surgical History:  Procedure Laterality Date  . HERNIA REPAIR    . KNEE ARTHROSCOPY    . ROTATOR CUFF REPAIR         Home Medications    Prior to Admission medications   Medication Sig Start Date End Date Taking? Authorizing Provider  amLODipine (NORVASC) 10 MG tablet Take 1 tablet (10 mg total) by mouth daily. 01/28/17   Silverio Decamp, MD  benzonatate (TESSALON) 200 MG capsule Take one cap by mouth at bedtime as needed for cough.  May repeat in 4 to 6 hours 02/11/17   Kandra Nicolas, MD  cyclobenzaprine (FLEXERIL) 10 MG tablet Take 1 tablet (10 mg total) by mouth 2 (two) times daily as needed. Patient not taking: Reported on 01/28/2017 06/21/16   Noe Gens, PA-C  doxycycline (VIBRAMYCIN) 100 MG capsule Take 1 capsule (100 mg total) by mouth 2 (two) times daily. Take with food (Rx void  after 02/19/17) 02/11/17   Kandra Nicolas, MD  ibuprofen (ADVIL,MOTRIN) 800 MG tablet Take 1 tablet (800 mg total) every 8 (eight) hours as needed by mouth. Patient not taking: Reported on 01/28/2017 11/24/16   Silverio Decamp, MD  lisinopril-hydrochlorothiazide (PRINZIDE,ZESTORETIC) 20-25 MG tablet Take 1 tablet by mouth daily. 12/15/16   Silverio Decamp, MD  metoprolol succinate (TOPROL-XL) 100 MG 24 hr tablet Take 1 tablet (100 mg total) by mouth daily. Take with or immediately following a meal. 12/22/16   Silverio Decamp, MD  Multiple Vitamins-Minerals (MULTIVITAMIN ADULT PO) Take 1 capsule by mouth daily.    [provider]  Omega-3 Fatty Acids (FISH OIL PO) Take 1 capsule by mouth daily.    [provider]  Phenyleph-CPM-DM-Aspirin (ALKA-SELTZER PLUS COLD & COUGH PO) Take by mouth.    [provider]  Pseudoephedrine-DM-GG-APAP (THERAFLU NIGHTTIME MEDICINE PO) Take by mouth.    [provider]    Family History Family History  Problem Relation Age of Onset  . Cancer Father        prostate  . Breast cancer Paternal Grandmother     Social History Social History   Tobacco Use  . Smoking status: Never Smoker  . Smokeless tobacco: Never Used  Substance Use Topics  . Alcohol use: Yes    Alcohol/week: 1.8 oz    Types: 3 Standard drinks or equivalent  per week    Comment: Occasional  . Drug use: No     Allergies   Patient has no known allergies.   Review of Systems Review of Systems + sore throat + cough No pleuritic pain No wheezing + nasal congestion + post-nasal drainage + sinus pain/pressure No itchy/red eyes No earache No hemoptysis No SOB No fever,ch/lls No nausea No vomiting No abdominal pain No diarrhea No urinary symptoms No skin rash + fatigue No myalgias + headache Used OTC meds without relief   Physical Exam Triage Vital Signs ED Triage Vitals  Enc Vitals Group     BP 02/11/17 1446 129/86       Pulse Rate 02/11/17 1446 69     Resp 02/11/17 1446 16     Temp 02/11/17 1446 (!) 97.4 F (36.3 C)     Temp Source 02/11/17 1446 Oral     SpO2 02/11/17 1446 97 %     Weight 02/11/17 1447 287 lb (130.2 kg)     Height 02/11/17 1447 6' 3"  (1.905 m)     Head Circumference --      Peak Flow --      Pain Score --      Pain Loc --      Pain Edu? --      Excl. in [place]? --    No data found.  Updated Vital Signs BP 129/86 (BP Location: Right Arm)   Pulse 69   Temp (!) 97.4 F (36.3 C) (Oral)   Resp 16   Ht 6' 3"  (1.905 m)   Wt 287 lb (130.2 kg)   SpO2 97%   BMI 35.87 kg/m   Visual Acuity Right Eye Distance:   Left Eye Distance:   Bilateral Distance:    Right Eye Near:   Left Eye Near:    Bilateral Near:     Physical Exam Nursing notes and Vital Signs reviewed. Appearance:  Patient appears stated age, and in no acute distress Eyes:  Pupils are equal, round, and reactive to light and accomodation.  Extraocular movement is intact.  Conjunctivae are not inflamed  Ears:  Canals normal.  Tympanic membranes normal.  Nose:  Mildly congested turbinates.  No sinus tenderness.   Pharynx:  Normal Neck:  Supple.  Enlarged nontender posterior/lateral nodes are palpated bilaterally.   Lungs:  Clear to auscultation.  Breath sounds are equal.  Moving air well. Heart:  Regular rate and rhythm without murmurs, rubs, or gallops.  Abdomen:  Nontender without masses or hepatosplenomegaly.  Bowel sounds are present.  No CVA or flank tenderness.  Extremities:  No edema.  Skin:  No rash present.    UC Treatments / Results  Labs (all labs ordered are listed, but only abnormal results are displayed) Labs Reviewed - No data to display  EKG  EKG Interpretation None       Radiology No results found.  Procedures Procedures (including critical care time)  Medications Ordered in UC Medications - No data to display   Initial Impression / Assessment and Plan / UC Course  I have  reviewed the triage vital signs and the nursing notes.  Pertinent labs & imaging results that were available during my care of the patient were reviewed by me and considered in my medical decision making (see chart for details).    There is no evidence of bacterial infection today.   Treat symptomatically for now  Prescription written for Benzonatate (Tessalon) to take at bedtime for night-time cough.  Take plain guaifenesin (1236m extended release tabs such as Mucinex) twice daily, with plenty of water, for cough and congestion.  Get adequate rest.   May use Afrin nasal spray (or generic oxymetazoline) each morning for about 5 days and then discontinue.  Also recommend using saline nasal spray several times daily and saline nasal irrigation (AYR is a common brand).  Use Flonase nasal spray each morning after using Afrin nasal spray and saline nasal irrigation. Try warm salt water gargles for sore throat.  Stop all antihistamines for now, and other non-prescription cough/cold preparations. May take Ibuprofen 2063m 4 tabs every 8 hours with food for headache, body aches, etc. Begin Doxycycline if not improving about one week or if persistent fever develops (Given a prescription to hold, with an expiration date)  Followup with Family Doctor if not improved in one week.     Final Clinical Impressions(s) / UC Diagnoses   Final diagnoses:  Viral URI with cough    ED Discharge Orders        Ordered    benzonatate (TESSALON) 200 MG capsule     02/11/17 1522    doxycycline (VIBRAMYCIN) 100 MG capsule  2 times daily     02/11/17 1523           BeKandra NicolasMD 02/14/17 1036

## 2017-02-11 NOTE — Progress Notes (Signed)
HPI: Patient is here for 2 week blood pressure check. Patient states he has been fighting a cough and cold for the last 3-4 days. He states he been been coughing up yellow phlegm and has left sided sinus pressure. He states he has been taking OTC Alka Setzer Plus and Thera Flu Nighttime for his symptoms. He denies any chest pain, shortness of breath, fever, dizziness.  Assessment and Plan: Review with provider. Per provider - patient to sit for 5  minutes before retaking blood pressure.  Allowed patient to sit 10-15 minutes, gave water with ice as well. Patient's blood pressure came down considerably and was allowed to leave.    Patient stated he will be going to our Urgent Care to be seen for his cold and sinus symptoms which has caused him  not to be able to go to work in two days.

## 2017-02-25 DIAGNOSIS — F4323 Adjustment disorder with mixed anxiety and depressed mood: Secondary | ICD-10-CM | POA: Diagnosis not present

## 2017-03-03 DIAGNOSIS — F4323 Adjustment disorder with mixed anxiety and depressed mood: Secondary | ICD-10-CM | POA: Diagnosis not present

## 2017-03-31 DIAGNOSIS — F4323 Adjustment disorder with mixed anxiety and depressed mood: Secondary | ICD-10-CM | POA: Diagnosis not present

## 2017-07-02 DIAGNOSIS — F4312 Post-traumatic stress disorder, chronic: Secondary | ICD-10-CM | POA: Diagnosis not present

## 2017-07-10 DIAGNOSIS — F4312 Post-traumatic stress disorder, chronic: Secondary | ICD-10-CM | POA: Diagnosis not present

## 2017-07-15 DIAGNOSIS — F4312 Post-traumatic stress disorder, chronic: Secondary | ICD-10-CM | POA: Diagnosis not present

## 2017-07-23 DIAGNOSIS — F4312 Post-traumatic stress disorder, chronic: Secondary | ICD-10-CM | POA: Diagnosis not present

## 2017-07-31 DIAGNOSIS — F4312 Post-traumatic stress disorder, chronic: Secondary | ICD-10-CM | POA: Diagnosis not present

## 2017-08-05 DIAGNOSIS — F4312 Post-traumatic stress disorder, chronic: Secondary | ICD-10-CM | POA: Diagnosis not present

## 2017-08-20 DIAGNOSIS — F4312 Post-traumatic stress disorder, chronic: Secondary | ICD-10-CM | POA: Diagnosis not present

## 2017-08-28 DIAGNOSIS — F4312 Post-traumatic stress disorder, chronic: Secondary | ICD-10-CM | POA: Diagnosis not present

## 2017-09-17 DIAGNOSIS — F4312 Post-traumatic stress disorder, chronic: Secondary | ICD-10-CM | POA: Diagnosis not present

## 2017-11-04 ENCOUNTER — Ambulatory Visit: Payer: BLUE CROSS/BLUE SHIELD | Admitting: Sports Medicine

## 2017-11-04 ENCOUNTER — Encounter: Payer: Self-pay | Admitting: Sports Medicine

## 2017-11-04 VITALS — BP 154/99 | HR 76 | Ht 75.0 in | Wt 270.0 lb

## 2017-11-04 DIAGNOSIS — Z0000 Encounter for general adult medical examination without abnormal findings: Secondary | ICD-10-CM | POA: Diagnosis not present

## 2017-11-04 DIAGNOSIS — I10 Essential (primary) hypertension: Secondary | ICD-10-CM | POA: Diagnosis not present

## 2017-11-04 DIAGNOSIS — Z125 Encounter for screening for malignant neoplasm of prostate: Secondary | ICD-10-CM | POA: Diagnosis not present

## 2017-11-04 DIAGNOSIS — M1612 Unilateral primary osteoarthritis, left hip: Secondary | ICD-10-CM

## 2017-11-04 MED ORDER — IBUPROFEN 800 MG PO TABS: 800.0000 mg | ORAL_TABLET | Freq: Three times a day (TID) | ORAL | 2 refills | Status: DC | PRN

## 2017-11-04 NOTE — Assessment & Plan Note (Addendum)
Has come off of all of his medications but he also has been doing the keto diet and lost 20 pounds. Blood pressure is elevated but not terrible today. Checking routine labs, I would like to see him back in about 3 months and reevaluate his blood pressure at that time.

## 2017-11-04 NOTE — Progress Notes (Signed)
Subjective:    CC: Follow-up  HPI: Hypertension: Elevated but not terrible, no headaches or visual changes, chest pain.  He has lost 20 pounds with the keto diet, would like to continue to work on aggressive weight loss and diet control of his blood pressure and declines use of any medications.  Risks, benefits explained.  I reviewed the past medical history, family history, social history, surgical history, and allergies today and no changes were needed.  Please see the problem list section below in epic for further details.  Past Medical History: Past Medical History:  Diagnosis Date  . Hyperlipidemia   . Hypertension   . Seasonal allergies   . Transaminitis    Past Surgical History: Past Surgical History:  Procedure Laterality Date  . HERNIA REPAIR    . KNEE ARTHROSCOPY    . ROTATOR CUFF REPAIR     Social History: Social History   Socioeconomic History  . Marital status: Single    Spouse name: Not on file  . Number of children: 3  . Years of education: Not on file  . Highest education level: Not on file  Occupational History  . Not on file  Social Needs  . Financial resource strain: Not on file  . Food insecurity:    Worry: Not on file    Inability: Not on file  . Transportation needs:    Medical: Not on file    Non-medical: Not on file  Tobacco Use  . Smoking status: Never Smoker  . Smokeless tobacco: Never Used  Substance and Sexual Activity  . Alcohol use: Yes    Alcohol/week: 3.0 standard drinks    Types: 3 Standard drinks or equivalent per week    Comment: Occasional  . Drug use: No  . Sexual activity: Not on file  Lifestyle  . Physical activity:    Days per week: Not on file    Minutes per session: Not on file  . Stress: Not on file  Relationships  . Social connections:    Talks on phone: Not on file    Gets together: Not on file    Attends religious service: Not on file    Active member of club or organization: Not on file    Attends  meetings of clubs or organizations: Not on file    Relationship status: Not on file  Other Topics Concern  . Not on file  Social History Narrative  . Not on file   Family History: Family History  Problem Relation Age of Onset  . Cancer Father        prostate  . Breast cancer Paternal Grandmother    Allergies: No Known Allergies Medications: See med rec.  Review of Systems: No fevers, chills, night sweats, weight loss, chest pain, or shortness of breath.   Objective:    General: Well Developed, well nourished, and in no acute distress.  Neuro: Alert and oriented x3, extra-ocular muscles intact, sensation grossly intact.  HEENT: Normocephalic, atraumatic, pupils equal round reactive to light, neck supple, no masses, no lymphadenopathy, thyroid nonpalpable.  Skin: Warm and dry, no rashes. Cardiac: Regular rate and rhythm, no murmurs rubs or gallops, no lower extremity edema.  Respiratory: Clear to auscultation bilaterally. Not using accessory muscles, speaking in full sentences.  Impression and Recommendations:    Essential hypertension, benign Has come off of all of his medications but he also has been doing the keto diet and lost 20 pounds. Blood pressure is elevated but not terrible today.  Checking routine labs, I would like to see him back in about 3 months and reevaluate his blood pressure at that time.  I spent 25 minutes with this patient, greater than 50% was face-to-face time counseling regarding the above diagnoses, specifically a plan for weight loss and continued dietary control of his blood pressure, we also discussed the risks and benefits of avoiding pharmacologic treatment of his blood pressure. ___________________________________________ Gwen Her. Dianah Field, M.D., ABFM., CAQSM. Primary Care and Sports Medicine [place] [facility]  Adjunct Professor of [place] of [facility] of Medicine

## 2017-11-05 LAB — COMPREHENSIVE METABOLIC PANEL WITH GFR
AG Ratio: 1.7 (calc) (ref 1.0–2.5)
ALT: 26 U/L (ref 9–46)
AST: 21 U/L (ref 10–40)
CO2: 29 mmol/L (ref 20–32)
Glucose, Bld: 91 mg/dL (ref 65–99)

## 2017-11-05 LAB — COMPREHENSIVE METABOLIC PANEL
Albumin: 4.6 g/dL (ref 3.6–5.1)
Alkaline phosphatase (APISO): 56 U/L (ref 40–115)
BUN: 19 mg/dL (ref 7–25)
Calcium: 9.1 mg/dL (ref 8.6–10.3)
Chloride: 103 mmol/L (ref 98–110)
Creat: 1.08 mg/dL (ref 0.60–1.35)
Globulin: 2.7 g/dL (calc) (ref 1.9–3.7)
Potassium: 3.8 mmol/L (ref 3.5–5.3)
Sodium: 139 mmol/L (ref 135–146)
Total Bilirubin: 0.4 mg/dL (ref 0.2–1.2)
Total Protein: 7.3 g/dL (ref 6.1–8.1)

## 2017-11-05 LAB — HEMOGLOBIN A1C
Hgb A1c MFr Bld: 5.6 %{Hb} (ref <5.7)
Mean Plasma Glucose: 114 (calc)
eAG (mmol/L): 6.3 (calc)

## 2017-11-05 LAB — CBC
HCT: 42 % (ref 38.5–50.0)
Hemoglobin: 14 g/dL (ref 13.2–17.1)
MCH: 26.8 pg — ABNORMAL LOW (ref 27.0–33.0)
MCHC: 33.3 g/dL (ref 32.0–36.0)
MCV: 80.3 fL (ref 80.0–100.0)
MPV: 11.9 fL (ref 7.5–12.5)
Platelets: 191 Thousand/uL (ref 140–400)
RBC: 5.23 10*6/uL (ref 4.20–5.80)
RDW: 14.3 % (ref 11.0–15.0)
WBC: 5.5 10*3/uL (ref 3.8–10.8)

## 2017-11-05 LAB — LIPID PANEL W/REFLEX DIRECT LDL
Cholesterol: 208 mg/dL — ABNORMAL HIGH (ref <200)
HDL: 41 mg/dL (ref >40)
LDL Cholesterol (Calc): 144 mg/dL (calc) — ABNORMAL HIGH
Non-HDL Cholesterol (Calc): 167 mg/dL — ABNORMAL HIGH (ref <130)
Total CHOL/HDL Ratio: 5.1 (calc) — ABNORMAL HIGH (ref <5.0)
Triglycerides: 113 mg/dL (ref <150)

## 2017-11-05 LAB — PSA, TOTAL AND FREE
PSA, % Free: 29 % (calc) (ref >25)
PSA, Free: 0.2 ng/mL
PSA, Total: 0.7 ng/mL (ref <=4.0)

## 2017-11-05 LAB — TSH: TSH: 1.6 mIU/L (ref 0.40–4.50)

## 2017-11-09 ENCOUNTER — Other Ambulatory Visit: Payer: Self-pay

## 2017-11-09 ENCOUNTER — Encounter: Payer: Self-pay | Admitting: Emergency Medicine

## 2017-11-09 ENCOUNTER — Emergency Department
Admission: EM | Admit: 2017-11-09 | Discharge: 2017-11-09 | Disposition: A | Discharge status: Home / Self Care | Payer: BLUE CROSS/BLUE SHIELD | Source: Home / Self Care | Attending: Family Medicine | Admitting: Family Medicine

## 2017-11-09 ENCOUNTER — Emergency Department (INDEPENDENT_AMBULATORY_CARE_PROVIDER_SITE_OTHER): Payer: BLUE CROSS/BLUE SHIELD

## 2017-11-09 DIAGNOSIS — S76311A Strain of muscle, fascia and tendon of the posterior muscle group at thigh level, right thigh, initial encounter: Secondary | ICD-10-CM | POA: Diagnosis not present

## 2017-11-09 DIAGNOSIS — M79651 Pain in right thigh: Secondary | ICD-10-CM | POA: Diagnosis not present

## 2017-11-09 DIAGNOSIS — M1711 Unilateral primary osteoarthritis, right knee: Secondary | ICD-10-CM | POA: Diagnosis not present

## 2017-11-09 NOTE — Discharge Instructions (Signed)
Apply ice pack for 20 to 30 minutes, 3 to 4 times daily  Continue until pain and swelling decrease.  May take Ibuprofen 228m, 4 tabs every 8 hours with food.  Wear Ace wrap until swelling resolves.  Begin range of motion and stretching exercises as tolerated.

## 2017-11-09 NOTE — ED Triage Notes (Signed)
Pt reports right upper posterior leg pain x1 day.  He reports no injury to the leg.  He has tried massage, icy hot, and 800 mg ibuporfen last night at 2230.

## 2017-11-09 NOTE — ED Provider Notes (Signed)
[name]    CSN: [id number] Arrival date & time: 11/09/17  1105     History   Chief Complaint Chief Complaint  Patient presents with  . Leg Pain    Right Upper    HPI Jerry Escobar is a 47 y.o. male.   Patient complains of pain/swelling in his right posterior thigh for one day.  He recalls no injury but admits that he does stretching exercises at work every day.  The history is provided by the patient.  Leg Pain  Location:  Leg Time since incident:  1 day Injury: no   Leg location:  R upper leg Pain details:    Quality:  Aching   Radiates to:  Does not radiate   Severity:  Mild   Onset quality:  Sudden   Timing:  Constant   Progression:  Unchanged Chronicity:  New Prior injury to area:  No Relieved by:  Nothing Worsened by:  Bearing weight and activity Ineffective treatments:  NSAIDs Associated symptoms: swelling   Associated symptoms: no back pain, no decreased ROM, no fatigue, no fever, no muscle weakness, no numbness, no stiffness and no tingling     Past Medical History:  Diagnosis Date  . Hyperlipidemia   . Hypertension   . Seasonal allergies   . Transaminitis     Patient Active Problem List   Diagnosis Date Noted  . Acute left-sided low back pain without sciatica 11/24/2016  . Right-sided chest wall pain 07/01/2016  . Eyelid abnormality 04/09/2016  . Hyperlipidemia 01/16/2014  . Transaminitis 01/16/2014  . Prediabetes 01/16/2014  . Annual physical exam 01/15/2014  . Essential hypertension, benign 01/15/2014  . Obesity 01/15/2014  . Meralgia paresthetica of right side 11/27/2013  . Osteoarthritis of left hip 09/18/2013    Past Surgical History:  Procedure Laterality Date  . HERNIA REPAIR    . KNEE ARTHROSCOPY    . ROTATOR CUFF REPAIR         Home Medications    Prior to Admission medications   Medication Sig Start Date End Date Taking? Authorizing Provider  ibuprofen (ADVIL,MOTRIN) 800 MG tablet Take 1 tablet (800  mg total) by mouth every 8 (eight) hours as needed. 11/04/17  Yes Silverio Decamp, MD  Multiple Vitamins-Minerals (MULTIVITAMIN ADULT PO) Take 1 capsule by mouth daily.   Yes [provider]  Omega-3 Fatty Acids (FISH OIL PO) Take 1 capsule by mouth daily.   Yes [provider]    Family History Family History  Problem Relation Age of Onset  . Cancer Father        prostate  . Breast cancer Paternal Grandmother     Social History Social History   Tobacco Use  . Smoking status: Never Smoker  . Smokeless tobacco: Never Used  Substance Use Topics  . Alcohol use: Yes    Alcohol/week: 3.0 standard drinks    Types: 3 Standard drinks or equivalent per week    Comment: Occasional  . Drug use: No     Allergies   Patient has no known allergies.   Review of Systems Review of Systems  Constitutional: Negative for fatigue and fever.  Musculoskeletal: Negative for back pain and stiffness.  All other systems reviewed and are negative.    Physical Exam Triage Vital Signs ED Triage Vitals  Enc Vitals Group     BP 11/09/17 1128 (!) 163/101     Pulse Rate 11/09/17 1128 69     Resp --  Temp 11/09/17 1128 98 F (36.7 C)     Temp Source 11/09/17 1128 Oral     SpO2 11/09/17 1128 97 %     Weight 11/09/17 1129 264 lb 12 oz (120.1 kg)     Height 11/09/17 1129 6' 3"  (1.905 m)     Head Circumference --      Peak Flow --      Pain Score 11/09/17 1129 5     Pain Loc --      Pain Edu? --      Excl. in [place]? --    No data found.  Updated Vital Signs BP (!) 163/101 (BP Location: Right Arm)   Pulse 69   Temp 98 F (36.7 C) (Oral)   Ht 6' 3"  (1.905 m)   Wt 120.1 kg   SpO2 97%   BMI 33.09 kg/m   Visual Acuity Right Eye Distance:   Left Eye Distance:   Bilateral Distance:    Right Eye Near:   Left Eye Near:    Bilateral Near:     Physical Exam  Constitutional: He appears well-developed and well-nourished. No distress.  HENT:  Head: Atraumatic.   Mouth/Throat: Oropharynx is clear and moist.  Eyes: Pupils are equal, round, and reactive to light.  Neck: Normal range of motion.  Cardiovascular: Normal heart sounds.  Pulmonary/Chest: Breath sounds normal.  Abdominal: There is no tenderness.  Musculoskeletal: He exhibits no edema.       Legs: Minimal swelling right posterior thigh.  Tenderness to palpation right posterior thigh, worse with resisted flexion of right knee, and extension right hip.  No erythema or warmth.  Neurological: He is alert.  Skin: Skin is warm and dry.  Nursing note and vitals reviewed.    UC Treatments / Results  Labs (all labs ordered are listed, but only abnormal results are displayed) Labs Reviewed - No data to display  EKG None  Radiology Dg Femur Min 2 Views Right  Result Date: 11/09/2017 CLINICAL DATA:  RIGHT posterior thigh pain for 1 day, no known injury EXAM: RIGHT FEMUR 2 VIEWS COMPARISON:  None FINDINGS: Osseous mineralization normal. Hip joint space preserved. Degenerative changes at RIGHT knee with joint space narrowing and spur formation. No acute fracture, dislocation, or bone destruction. No knee joint effusion. IMPRESSION: No acute osseous abnormalities. Degenerative changes RIGHT knee. Electronically Signed   By: Lavonia Dana M.D.   On: 11/09/2017 12:33    Procedures Procedures (including critical care time)  Medications Ordered in UC Medications - No data to display  Initial Impression / Assessment and Plan / UC Course  I have reviewed the triage vital signs and the nursing notes.  Pertinent labs & imaging results that were available during my care of the patient were reviewed by me and considered in my medical decision making (see chart for details).    Ace wrap applied. Followup with Dr. Aundria Mems or Dr. Lynne Leader ([facility]) if not improving about two weeks.    Final Clinical Impressions(s) / UC Diagnoses   Final diagnoses:  Right hamstring  strain, initial encounter     Discharge Instructions     Apply ice pack for 20 to 30 minutes, 3 to 4 times daily  Continue until pain and swelling decrease.  May take Ibuprofen 291m, 4 tabs every 8 hours with food.  Wear Ace wrap until swelling resolves.  Begin range of motion and stretching exercises as tolerated.    ED Prescriptions    None  Kandra Nicolas, MD 11/11/17 [phone]

## 2018-02-04 ENCOUNTER — Ambulatory Visit: Payer: BLUE CROSS/BLUE SHIELD | Admitting: Sports Medicine

## 2018-07-05 DIAGNOSIS — F4322 Adjustment disorder with anxiety: Secondary | ICD-10-CM | POA: Diagnosis not present

## 2018-07-13 DIAGNOSIS — F4322 Adjustment disorder with anxiety: Secondary | ICD-10-CM | POA: Diagnosis not present

## 2018-07-22 DIAGNOSIS — F4322 Adjustment disorder with anxiety: Secondary | ICD-10-CM | POA: Diagnosis not present

## 2018-07-27 DIAGNOSIS — F4322 Adjustment disorder with anxiety: Secondary | ICD-10-CM | POA: Diagnosis not present

## 2018-08-02 DIAGNOSIS — F4322 Adjustment disorder with anxiety: Secondary | ICD-10-CM | POA: Diagnosis not present

## 2018-08-18 DIAGNOSIS — F4322 Adjustment disorder with anxiety: Secondary | ICD-10-CM | POA: Diagnosis not present

## 2018-09-05 ENCOUNTER — Ambulatory Visit (INDEPENDENT_AMBULATORY_CARE_PROVIDER_SITE_OTHER): Payer: BC Managed Care – PPO | Admitting: Sports Medicine

## 2018-09-05 ENCOUNTER — Ambulatory Visit (INDEPENDENT_AMBULATORY_CARE_PROVIDER_SITE_OTHER): Payer: BC Managed Care – PPO

## 2018-09-05 ENCOUNTER — Other Ambulatory Visit: Payer: Self-pay

## 2018-09-05 ENCOUNTER — Encounter: Payer: Self-pay | Admitting: Sports Medicine

## 2018-09-05 DIAGNOSIS — M25512 Pain in left shoulder: Secondary | ICD-10-CM

## 2018-09-05 DIAGNOSIS — M75112 Incomplete rotator cuff tear or rupture of left shoulder, not specified as traumatic: Secondary | ICD-10-CM

## 2018-09-05 DIAGNOSIS — M75102 Unspecified rotator cuff tear or rupture of left shoulder, not specified as traumatic: Secondary | ICD-10-CM | POA: Insufficient documentation

## 2018-09-05 NOTE — Addendum Note (Signed)
Addended by: Silverio Decamp on: 09/05/2018 11:11 AM   Modules accepted: Orders

## 2018-09-05 NOTE — Assessment & Plan Note (Addendum)
Impingement symptoms with relatively good strength, he did have some weakness. Subacromial injection, x-rays, formal physical therapy for 6 weeks, return to see me, MRI if no better.  On ultrasound I did however see a near full-thickness tear of the supraspinatus with significant attenuation of the insertional fibers. For this reason as well as weakness we are going to proceed with MRI without contrast.  MRI confirms a full-thickness tear of the supraspinatus, no report of significant fatty atrophy, I would like Dr. Griffin Basil to take a look at this and consider surgical repair.

## 2018-09-05 NOTE — Progress Notes (Addendum)
Subjective:    CC: Left shoulder pain  HPI: This is a pleasant 48 year old male, the past couple weeks he has had severe pain in his left shoulder, localized over the deltoid, worse with overhead activities, no trauma, it does keep him up at night.  He does have a history of a right-sided full-thickness rotator cuff tear needing immediate surgery.  I reviewed the past medical history, family history, social history, surgical history, and allergies today and no changes were needed.  Please see the problem list section below in epic for further details.  Past Medical History: Past Medical History:  Diagnosis Date  . Hyperlipidemia   . Hypertension   . Seasonal allergies   . Transaminitis    Past Surgical History: Past Surgical History:  Procedure Laterality Date  . HERNIA REPAIR    . KNEE ARTHROSCOPY    . ROTATOR CUFF REPAIR     Social History: Social History   Socioeconomic History  . Marital status: Single    Spouse name: Not on file  . Number of children: 3  . Years of education: Not on file  . Highest education level: Not on file  Occupational History  . Not on file  Social Needs  . Financial resource strain: Not on file  . Food insecurity    Worry: Not on file    Inability: Not on file  . Transportation needs    Medical: Not on file    Non-medical: Not on file  Tobacco Use  . Smoking status: Never Smoker  . Smokeless tobacco: Never Used  Substance and Sexual Activity  . Alcohol use: Yes    Alcohol/week: 3.0 standard drinks    Types: 3 Standard drinks or equivalent per week    Comment: Occasional  . Drug use: No  . Sexual activity: Not on file  Lifestyle  . Physical activity    Days per week: Not on file    Minutes per session: Not on file  . Stress: Not on file  Relationships  . Social Herbalist on phone: Not on file    Gets together: Not on file    Attends religious service: Not on file    Active member of club or organization: Not on  file    Attends meetings of clubs or organizations: Not on file    Relationship status: Not on file  Other Topics Concern  . Not on file  Social History Narrative  . Not on file   Family History: Family History  Problem Relation Age of Onset  . Cancer Father        prostate  . Breast cancer Paternal Grandmother    Allergies: No Known Allergies Medications: See med rec.  Review of Systems: No fevers, chills, night sweats, weight loss, chest pain, or shortness of breath.   Objective:    General: Well Developed, well nourished, and in no acute distress.  Neuro: Alert and oriented x3, extra-ocular muscles intact, sensation grossly intact.  HEENT: Normocephalic, atraumatic, pupils equal round reactive to light, neck supple, no masses, no lymphadenopathy, thyroid nonpalpable.  Skin: Warm and dry, no rashes. Cardiac: Regular rate and rhythm, no murmurs rubs or gallops, no lower extremity edema.  Respiratory: Clear to auscultation bilaterally. Not using accessory muscles, speaking in full sentences. Left shoulder: Inspection reveals no abnormalities, atrophy or asymmetry. Palpation is normal with no tenderness over AC joint or bicipital groove. ROM is full in all planes. Rotator cuff strength normal throughout. Positive Neer  and Hawkin's tests, empty can. Speeds and Yergason's tests normal. No labral pathology noted with negative Obrien's, negative crank, negative clunk, and good stability. Normal scapular function observed. No painful arc and no drop arm sign. No apprehension sign  Procedure: Real-time Ultrasound Guided injection of the left subacromial bursa Device: GE Logiq E  Verbal informed consent obtained.  Time-out conducted.  Noted no overlying erythema, induration, or other signs of local infection.  Skin prepped in a sterile fashion.  Local anesthesia: Topical Ethyl chloride.  With sterile technique and under real time ultrasound guidance:  1 cc Kenalog 40, 1 cc  lidocaine, 1 cc bupivacaine injected easily.  I did notice near full-thickness tearing of the supraspinatus with a few tendon fibers hanging on, however there is significant attenuation of the tendon itself. Completed without difficulty  Pain immediately resolved suggesting accurate placement of the medication.  Advised to call if fevers/chills, erythema, induration, drainage, or persistent bleeding.  Images permanently stored and available for review in the ultrasound unit.  Impression: Technically successful ultrasound guided injection.  Impression and Recommendations:    Rotator cuff tear, left Impingement symptoms with relatively good strength, he did have some weakness. Subacromial injection, x-rays, formal physical therapy for 6 weeks, return to see me, MRI if no better.  On ultrasound I did however see a near full-thickness tear of the supraspinatus with significant attenuation of the insertional fibers. For this reason as well as weakness we are going to proceed with MRI without contrast.  MRI confirms a full-thickness tear of the supraspinatus, no report of significant fatty atrophy, I would like Dr. Griffin Basil to take a look at this and consider surgical repair.   ___________________________________________ Gwen Her. Dianah Field, M.D., ABFM., CAQSM. Primary Care and Sports Medicine [facility]  Adjunct Professor of [employer]

## 2018-09-09 ENCOUNTER — Ambulatory Visit (INDEPENDENT_AMBULATORY_CARE_PROVIDER_SITE_OTHER): Payer: BC Managed Care – PPO | Admitting: Rehabilitative and Restorative Service Providers"

## 2018-09-09 ENCOUNTER — Other Ambulatory Visit: Payer: Self-pay

## 2018-09-09 ENCOUNTER — Encounter: Payer: Self-pay | Admitting: Rehabilitative and Restorative Service Providers"

## 2018-09-09 DIAGNOSIS — M25512 Pain in left shoulder: Secondary | ICD-10-CM | POA: Diagnosis not present

## 2018-09-09 DIAGNOSIS — R293 Abnormal posture: Secondary | ICD-10-CM | POA: Diagnosis not present

## 2018-09-09 DIAGNOSIS — R29898 Other symptoms and signs involving the musculoskeletal system: Secondary | ICD-10-CM | POA: Diagnosis not present

## 2018-09-09 DIAGNOSIS — M6281 Muscle weakness (generalized): Secondary | ICD-10-CM

## 2018-09-09 NOTE — Patient Instructions (Addendum)
Access Code: [email or web address]  URL: [email or web address]  Date: 09/09/2018  Prepared by: Gillermo Murdoch   Exercises  Seated Cervical Retraction - 10 reps - 1 sets - 3x daily - 7x weekly  Standing Scapular Retraction - 10 reps - 1 sets - 10 hold - 3x daily - 7x weekly  Shoulder External Rotation and Scapular Retraction - 10 reps - 1 sets - hold - 3x daily - 7x weekly  Standing Scapular Retraction in Abduction - 10 reps - 1 sets - 3x daily - 7x weekly  Doorway Pec Stretch at 60 Degrees Abduction - 3 reps - 1 sets - 3x daily - 7x weekly  Doorway Pec Stretch at 90 Degrees Abduction - 3 reps - 1 sets - 30 seconds hold - 3x daily - 7x weekly  Doorway Pec Stretch at 120 Degrees Abduction - 3 reps - 1 sets - 30 second hold hold - 3x daily - 7x weekly    TENS UNIT: This is helpful for muscle pain and spasm.   Search and Purchase a TENS 7000 2nd edition at www.tenspros.com. It should be less than $30.     TENS unit instructions: Do not shower or bathe with the unit on Turn the unit off before removing electrodes or batteries If the electrodes lose stickiness add a drop of water to the electrodes after they are disconnected from the unit and place on plastic sheet. If you continued to have difficulty, call the TENS unit company to purchase more electrodes. Do not apply lotion on the skin area prior to use. Make sure the skin is clean and dry as this will help prolong the life of the electrodes. After use, always check skin for unusual red areas, rash or other skin difficulties. If there are any skin problems, does not apply electrodes to the same area. Never remove the electrodes from the unit by pulling the wires. Do not use the TENS unit or electrodes other than as directed. Do not change electrode placement without consultating your therapist or physician. Keep 2 fingers with between each electrode. Wear time ratio is 2:1, on to off times.    For example on for 30 minutes off for 15  minutes and then on for 30 minutes off for 15 minutes

## 2018-09-09 NOTE — Therapy (Signed)
[facility] [place], [place], [address] Phone: [phone]   Fax:  [phone]  Physical Therapy Evaluation  Patient Details  Name: Jerry Escobar MRN: [id number] Date of Birth: February 26, 1970 Referring Provider (PT): Dr Dianah Field    Encounter Date: 09/09/2018  PT End of Session - 09/09/18 1534    Visit Number  1    Number of Visits  12    Date for PT Re-Evaluation  10/21/18    PT Start Time  0933    PT Stop Time  1025    PT Time Calculation (min)  52 min    Activity Tolerance  Patient tolerated treatment well       Past Medical History:  Diagnosis Date  . Hyperlipidemia   . Hypertension   . Seasonal allergies   . Transaminitis     Past Surgical History:  Procedure Laterality Date  . HERNIA REPAIR    . KNEE ARTHROSCOPY    . ROTATOR CUFF REPAIR      There were no vitals filed for this visit.   Subjective Assessment - 09/09/18 0940    Subjective  No injury - gradual onset of Lt shoulder pain over the past 3 months - worse in the past 3 weeks. Injection this week with some improvement.    Pertinent History  Rt shoulder RCR ~ 6 yrs ago; Rt knee surgery - scope x 2 most recent ~ 12 yrs ago; HTN    Patient Stated Goals  get rid of shoulder pain and prevent it from coming back    Currently in Pain?  No/denies    Pain Score  0-No pain    Pain Location  Shoulder    Pain Orientation  Left    Pain Descriptors / Indicators  Sharp;Aching;Dull   when he has pain   Pain Type  Acute pain    Pain Onset  More than a month ago    Pain Frequency  Intermittent    Aggravating Factors   lifting arm up; lifting; lying on Lt side    Pain Relieving Factors  injection; meds         [facility] PT Assessment - 09/09/18 0001      Assessment   Medical Diagnosis  Lt shoulder pain     Referring Provider (PT)  Dr Dianah Field     Onset Date/Surgical Date  06/03/18    Hand Dominance  Right    Next MD Visit  10/17/2018     Prior Therapy  for Rt shoulder and knee       Precautions   Precautions  None      Balance Screen   Has the patient fallen in the past 6 months  No    Has the patient had a decrease in activity level because of a fear of falling?   No    Is the patient reluctant to leave their home because of a fear of falling?   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     Leisure  works with [employer] ~ 30 hours/wk       Observation/Other Assessments   Focus on Therapeutic Outcomes (FOTO)   37% limitation       Sensation  Additional Comments  WFL's per pt report       Posture/Postural Control   Posture Comments  head forward; shoudlers rounded and elevated; head of the humerus anterior in orientation; scapulae abducted along the thoracic wall       AROM   Right Shoulder Extension  7 Degrees    Right Shoulder Flexion  144 Degrees    Right Shoulder ABduction  145 Degrees    Right Shoulder Internal Rotation  35 Degrees    Right Shoulder External Rotation  90 Degrees    Left Shoulder Extension  63 Degrees    Left Shoulder Flexion  136 Degrees    Left Shoulder ABduction  126 Degrees   pain    Left Shoulder Internal Rotation  75 Degrees   pain    Left Shoulder External Rotation  23 Degrees   pain      Strength   Overall Strength Comments  UE strength WNL's except Lt middle and lower trap 4+/5 and painful       Palpation   Spinal mobility  hypomobile thoracic and lower cervical with CPA mobs     Palpation comment  muscular tightness Lt > Rt pecs; upper trap; leveator; teres biceps; anterior deltoid                 Objective measurements completed on examination: See above findings.      [facility] Adult PT Treatment/Exercise - 09/09/18 0001      Neuro Re-ed    Neuro Re-ed Details   initiated  postural correction       Shoulder Exercises: Standing   Other Standing Exercises  axial extension 10 sec x 5; scap squeeze 10 sec x 5; L's x 10 with swim noodle       Shoulder Exercises: Stretch   Other Shoulder Stretches  3 way doorway 30 sec x 2 reps each position       Moist Heat Therapy   Number Minutes Moist Heat  15 Minutes    Moist Heat Location  Shoulder   Lt     Electrical Stimulation   Electrical Stimulation Location  Lt shoulder girdle     Electrical Stimulation Action  IFC    Electrical Stimulation Parameters  to tolerance    Electrical Stimulation Goals  Pain;Tone                  PT Long Term Goals - 09/09/18 1536      PT LONG TERM GOAL #1   Title  Improve posture and alignment with patient to demonstrate improved upright posture with posterior shoulder girdle engaged    Time  6    Period  Weeks    Status  New    Target Date  10/21/18      PT LONG TERM GOAL #2   Title  Increase AROM Lt shoulder to equal AROM Rt shoulder with no pain with active ROM    Time  6    Period  Weeks    Status  New    Target Date  10/21/18      PT LONG TERM GOAL #3   Title  Patient to rreport no Lt shoulder pain with functional activities using Lt UE or with sleeping on Lt side    Time  6    Period  Weeks    Status  New    Target Date  10/21/18      PT LONG TERM GOAL #4   Title  Independent in HEP    Time  6    Period  Weeks    Status  New    Target Date  10/21/18      PT LONG TERM GOAL #5   Title  Improve FOTO to </= 26% limitation    Time  6    Period  Weeks    Status  New    Target Date  10/21/18             Plan - 09/09/18 1012    Clinical Impression Statement  Patient presents iwth 3 month history of Lt shoulder pain with no known injury. Symptoms have improved following injection but continue to be problematic. Patient has poor posture and alignment; poor posterior shoudler girdle strength; muscular tightness Lt shoulder; pain with AROM;  decreased AROM Lt shoulder; decreased functional activity level. patient will benefit from PT to address problems identified.    Personal Factors and Comorbidities  Fitness    Stability/Clinical Decision Making  Stable/Uncomplicated    Clinical Decision Making  Low    Rehab Potential  Good    PT Frequency  2x / week    PT Duration  6 weeks    PT Treatment/Interventions  Patient/family education;ADLs/Self Care Home Management;Cryotherapy;Electrical Stimulation;Iontophoresis 38m/ml Dexamethasone;Moist Heat;Ultrasound;Manual techniques;Dry needling;Neuromuscular re-education;Therapeutic activities;Therapeutic exercise    PT Next Visit Plan  Review HEP; add prolonged supine snow angel; add posterior shoulder girdle strengthening as symptoms allow    PT Home Exercise Plan  [email or web address]    Consulted and Agree with Plan of Care  Patient       Patient will benefit from skilled therapeutic intervention in order to improve the following deficits and impairments:  Pain, Increased fascial restricitons, Increased muscle spasms, Impaired UE functional use, Hypomobility, Decreased strength, Decreased range of motion, Decreased mobility, Decreased activity tolerance  Visit Diagnosis: Acute pain of left shoulder - Plan: PT plan of care cert/re-cert  Abnormal posture - Plan: PT plan of care cert/re-cert  Other symptoms and signs involving the musculoskeletal system - Plan: PT plan of care cert/re-cert  Muscle weakness (generalized) - Plan: PT plan of care cert/re-cert     Problem List Patient Active Problem List   Diagnosis Date Noted  . Rotator cuff tear, left 09/05/2018  . Acute left-sided low back pain without sciatica 11/24/2016  . Right-sided chest wall pain 07/01/2016  . Eyelid abnormality 04/09/2016  . Hyperlipidemia 01/16/2014  . Transaminitis 01/16/2014  . Prediabetes 01/16/2014  . Annual physical exam 01/15/2014  . Essential hypertension, benign 01/15/2014  . Obesity 01/15/2014  .  Meralgia paresthetica of right side 11/27/2013  . Osteoarthritis of left hip 09/18/2013    Celyn PNilda SimmerPT, MPH  09/09/2018, 3:45 PM  [facility] [address]Phone: [phone]  Fax:  [phone] Name: Jerry WeltyMRN: [id number] of Birth: 110/17/1972

## 2018-09-11 ENCOUNTER — Other Ambulatory Visit: Payer: Self-pay

## 2018-09-11 ENCOUNTER — Ambulatory Visit (INDEPENDENT_AMBULATORY_CARE_PROVIDER_SITE_OTHER): Payer: BC Managed Care – PPO

## 2018-09-11 DIAGNOSIS — M25512 Pain in left shoulder: Secondary | ICD-10-CM | POA: Diagnosis not present

## 2018-09-11 DIAGNOSIS — S43492A Other sprain of left shoulder joint, initial encounter: Secondary | ICD-10-CM | POA: Diagnosis not present

## 2018-09-11 DIAGNOSIS — M75112 Incomplete rotator cuff tear or rupture of left shoulder, not specified as traumatic: Secondary | ICD-10-CM

## 2018-09-12 ENCOUNTER — Encounter: Payer: Self-pay | Admitting: Rehabilitative and Restorative Service Providers"

## 2018-09-12 ENCOUNTER — Ambulatory Visit: Payer: BC Managed Care – PPO | Admitting: Rehabilitative and Restorative Service Providers"

## 2018-09-12 DIAGNOSIS — M25512 Pain in left shoulder: Secondary | ICD-10-CM

## 2018-09-12 DIAGNOSIS — R29898 Other symptoms and signs involving the musculoskeletal system: Secondary | ICD-10-CM | POA: Diagnosis not present

## 2018-09-12 DIAGNOSIS — M6281 Muscle weakness (generalized): Secondary | ICD-10-CM

## 2018-09-12 DIAGNOSIS — R293 Abnormal posture: Secondary | ICD-10-CM

## 2018-09-12 NOTE — Addendum Note (Signed)
Addended by: Silverio Decamp on: 09/12/2018 05:48 PM   Modules accepted: Orders

## 2018-09-12 NOTE — Therapy (Addendum)
[place], [place], [address] Phone: [phone]   Fax:  [phone]  Physical Therapy Treatment  Patient Details  Name: Jerry Escobar MRN: [id number] Date of Birth: 1970/04/29 Referring Provider (PT): Dr Dianah Field    Encounter Date: 09/12/2018  PT End of Session - 09/12/18 1601    Visit Number  2    Number of Visits  12    Date for PT Re-Evaluation  10/21/18    PT Start Time  1600    PT Stop Time  1649    PT Time Calculation (min)  49 min    Activity Tolerance  Patient tolerated treatment well       Past Medical History:  Diagnosis Date  . Hyperlipidemia   . Hypertension   . Seasonal allergies   . Transaminitis     Past Surgical History:  Procedure Laterality Date  . HERNIA REPAIR    . KNEE ARTHROSCOPY    . ROTATOR CUFF REPAIR      There were no vitals filed for this visit.  Subjective Assessment - 09/12/18 1602    Subjective  Working on exercises at home and work some. Trying to work on the posture but that is harder. Wants to get a TENS unit but has not had a chance to order it yet.    Currently in Pain?  No/denies                       [facility] Adult PT Treatment/Exercise - 09/12/18 0001      Therapeutic Activites    Therapeutic Activities  --   myofacial ball release work standing Lt shoulder girdle      Shoulder Exercises: Supine   Other Supine Exercises  prolonged snow angel 2-3 min on green noodle arms ~ 75 deg abd       Shoulder Exercises: Standing   Extension  Strengthening;Both;10 reps;Theraband    Theraband Level (Shoulder Extension)  Level 4 (Blue)    Row  Strengthening;Both;10 reps;Theraband    Theraband Level (Shoulder Row)  Level 4 (Blue)    Retraction  Strengthening;Both;10 reps;Theraband    Theraband Level (Shoulder Retraction)  Level 2 (Red)    Other Standing Exercises  axial extension 10 sec x 5; scap squeeze 10 sec x 5; L's x 10 with  swim noodle       Shoulder Exercises: Stretch   Other Shoulder Stretches  3 way doorway 30 sec x 2 reps each position     Other Shoulder Stretches  shoulder flexion hands on top of doorway fpt stepping through to point of stretch  30 sec x 2       Moist Heat Therapy   Number Minutes Moist Heat  15 Minutes    Moist Heat Location  Shoulder   Lt     Electrical Stimulation   Electrical Stimulation Location  Lt shoulder girdle     Electrical Stimulation Action  IFC    Electrical Stimulation Parameters  to tolerance    Electrical Stimulation Goals  Pain;Tone      Manual Therapy   Manual therapy comments  pt supine     Soft tissue mobilization  deep tissue work through anterior Lt shoulder through the pecs     Manual Traction  through long arm Lt UE in neutral at side              PT Education - 09/12/18 1624    Education  Details  HEP    Person(s) Educated  Patient    Methods  Explanation;Demonstration;Tactile cues;Verbal cues;Handout    Comprehension  Verbalized understanding;Returned demonstration;Verbal cues required;Tactile cues required          PT Long Term Goals - 09/09/18 1536      PT LONG TERM GOAL #1   Title  Improve posture and alignment with patient to demonstrate improved upright posture with posterior shoulder girdle engaged    Time  6    Period  Weeks    Status  New    Target Date  10/21/18      PT LONG TERM GOAL #2   Title  Increase AROM Lt shoulder to equal AROM Rt shoulder with no pain with active ROM    Time  6    Period  Weeks    Status  New    Target Date  10/21/18      PT LONG TERM GOAL #3   Title  Patient to rreport no Lt shoulder pain with functional activities using Lt UE or with sleeping on Lt side    Time  6    Period  Weeks    Status  New    Target Date  10/21/18      PT LONG TERM GOAL #4   Title  Independent in HEP    Time  6    Period  Weeks    Status  New    Target Date  10/21/18      PT LONG TERM GOAL #5   Title  Improve  FOTO to </= 26% limitation    Time  6    Period  Weeks    Status  New    Target Date  10/21/18            Plan - 09/12/18 1602    Clinical Impression Statement  Reviewed and corrected exercises as needed; added posterior shoulder girdle strengthening with TB. Added manual work Lt anterior shoulder musculature. Good progress for 2nd visit.    Personal Factors and Comorbidities  Fitness    Stability/Clinical Decision Making  Stable/Uncomplicated    Rehab Potential  Good    PT Frequency  2x / week    PT Duration  6 weeks    PT Treatment/Interventions  Patient/family education;ADLs/Self Care Home Management;Cryotherapy;Electrical Stimulation;Iontophoresis 48m/ml Dexamethasone;Moist Heat;Ultrasound;Manual techniques;Dry needling;Neuromuscular re-education;Therapeutic activities;Therapeutic exercise    PT Next Visit Plan  Review HEP; assess response to new exercises and manual work; progress with posterior shoulder girdle strengthening    PT Home Exercise Plan  [email or web address]    Consulted and Agree with Plan of Care  Patient       Patient will benefit from skilled therapeutic intervention in order to improve the following deficits and impairments:  Pain, Increased fascial restricitons, Increased muscle spasms, Impaired UE functional use, Hypomobility, Decreased strength, Decreased range of motion, Decreased mobility, Decreased activity tolerance  Visit Diagnosis: Acute pain of left shoulder  Abnormal posture  Other symptoms and signs involving the musculoskeletal system  Muscle weakness (generalized)     Problem List Patient Active Problem List   Diagnosis Date Noted  . Rotator cuff tear, left 09/05/2018  . Acute left-sided low back pain without sciatica 11/24/2016  . Right-sided chest wall pain 07/01/2016  . Eyelid abnormality 04/09/2016  . Hyperlipidemia 01/16/2014  . Transaminitis 01/16/2014  . Prediabetes 01/16/2014  . Annual physical exam 01/15/2014  . Essential  hypertension, benign 01/15/2014  . Obesity 01/15/2014  . Meralgia  paresthetica of right side 11/27/2013  . Osteoarthritis of left hip 09/18/2013    Celyn Nilda Simmer PT, MPH  09/12/2018, 4:40 PM  [facility] [place], [place], [address] Phone: [phone]   Fax:  [phone]  Name: Jerry Escobar MRN: [id number] Date of Birth: December 17, 1970  PHYSICAL THERAPY DISCHARGE SUMMARY  Visits from Start of Care: 2  Current functional level related to goals / functional outcomes: See progress note for discharge status    Remaining deficits: Unknown    Education / Equipment: HEP  Plan: Patient agrees to discharge.  Patient goals were not met. Patient is being discharged due to a change in medical status.  ?????     Celyn P. Helene Kelp PT, MPH 10/13/18 9:14 AM

## 2018-09-12 NOTE — Patient Instructions (Addendum)
SUPINE Tips A    Being in the supine position means to be lying on the back. Lying on the back is the position of least compression on the bones and discs of the spine, and helps to re-align the natural curves of the back. 2-5 min arms relaxed at side - can bend elbows to release the stretch for a few seconds as needed.     Access Code: [email or web address]  URL: [email or web address]  Date: 09/12/2018  Prepared by: Gillermo Murdoch   Exercises  Seated Cervical Retraction - 10 reps - 1 sets - 3x daily - 7x weekly  Standing Scapular Retraction - 10 reps - 1 sets - 10 hold - 3x daily - 7x weekly  Shoulder External Rotation and Scapular Retraction - 10 reps - 1 sets - hold - 3x daily - 7x weekly  Standing Scapular Retraction in Abduction - 10 reps - 1 sets - 3x daily - 7x weekly  Doorway Pec Stretch at 60 Degrees Abduction - 3 reps - 1 sets - 3x daily - 7x weekly  Doorway Pec Stretch at 90 Degrees Abduction - 3 reps - 1 sets - 30 seconds hold - 3x daily - 7x weekly  Doorway Pec Stretch at 120 Degrees Abduction - 3 reps - 1 sets - 30 second hold hold - 3x daily - 7x weekly  Shoulder External Rotation and Scapular Retraction with Resistance - 10 reps - 2-3 sets - 4-5x daily - 7x weekly  Scapular Retraction with Resistance - 10 reps - 3 sets - 2x daily - 7x weekly  Scapular Retraction with Resistance Advanced - 10 reps - 3 sets - 2x daily - 7x weekly

## 2018-09-15 ENCOUNTER — Encounter: Payer: BC Managed Care – PPO | Admitting: Rehabilitative and Restorative Service Providers"

## 2018-09-15 DIAGNOSIS — M25512 Pain in left shoulder: Secondary | ICD-10-CM | POA: Diagnosis not present

## 2018-09-19 ENCOUNTER — Encounter: Payer: BC Managed Care – PPO | Admitting: Rehabilitative and Restorative Service Providers"

## 2018-09-22 ENCOUNTER — Encounter: Payer: BC Managed Care – PPO | Admitting: Physical Therapy

## 2018-09-28 ENCOUNTER — Other Ambulatory Visit: Payer: Self-pay

## 2018-09-28 ENCOUNTER — Encounter (HOSPITAL_BASED_OUTPATIENT_CLINIC_OR_DEPARTMENT_OTHER): Payer: Self-pay | Admitting: *Deleted

## 2018-09-29 ENCOUNTER — Encounter: Payer: BC Managed Care – PPO | Admitting: Rehabilitative and Restorative Service Providers"

## 2018-10-03 ENCOUNTER — Other Ambulatory Visit (HOSPITAL_COMMUNITY)
Admission: RE | Admit: 2018-10-03 | Discharge: 2018-10-03 | Disposition: A | Discharge status: Home / Self Care | Payer: BC Managed Care – PPO | Source: Ambulatory Visit | Attending: Orthopaedic Surgery | Admitting: Orthopaedic Surgery

## 2018-10-03 DIAGNOSIS — Z01812 Encounter for preprocedural laboratory examination: Secondary | ICD-10-CM | POA: Insufficient documentation

## 2018-10-03 DIAGNOSIS — Z20828 Contact with and (suspected) exposure to other viral communicable diseases: Secondary | ICD-10-CM | POA: Insufficient documentation

## 2018-10-03 NOTE — Progress Notes (Signed)
Ensure given to pt with instructions to finish by 0430 DOS. Pt verbalized understanding

## 2018-10-04 LAB — NOVEL CORONAVIRUS, NAA (HOSP ORDER, SEND-OUT TO REF LAB; TAT 18-24 HRS): SARS-CoV-2, NAA: NOT DETECTED

## 2018-10-06 ENCOUNTER — Ambulatory Visit (HOSPITAL_BASED_OUTPATIENT_CLINIC_OR_DEPARTMENT_OTHER): Payer: BC Managed Care – PPO | Admitting: Anesthesiology

## 2018-10-06 ENCOUNTER — Ambulatory Visit (HOSPITAL_BASED_OUTPATIENT_CLINIC_OR_DEPARTMENT_OTHER)
Admission: RE | Admit: 2018-10-06 | Discharge: 2018-10-06 | Disposition: A | Discharge status: Home / Self Care | Payer: BC Managed Care – PPO | Attending: Orthopaedic Surgery | Admitting: Orthopaedic Surgery

## 2018-10-06 ENCOUNTER — Encounter (HOSPITAL_BASED_OUTPATIENT_CLINIC_OR_DEPARTMENT_OTHER): Payer: Self-pay | Admitting: Anesthesiology

## 2018-10-06 ENCOUNTER — Encounter (HOSPITAL_BASED_OUTPATIENT_CLINIC_OR_DEPARTMENT_OTHER)
Admission: RE | Disposition: A | Discharge status: Home / Self Care | Payer: Self-pay | Source: Home / Self Care | Attending: Orthopaedic Surgery

## 2018-10-06 DIAGNOSIS — M7522 Bicipital tendinitis, left shoulder: Secondary | ICD-10-CM | POA: Diagnosis not present

## 2018-10-06 DIAGNOSIS — M24112 Other articular cartilage disorders, left shoulder: Secondary | ICD-10-CM | POA: Diagnosis not present

## 2018-10-06 DIAGNOSIS — M75122 Complete rotator cuff tear or rupture of left shoulder, not specified as traumatic: Secondary | ICD-10-CM | POA: Diagnosis not present

## 2018-10-06 DIAGNOSIS — M75102 Unspecified rotator cuff tear or rupture of left shoulder, not specified as traumatic: Secondary | ICD-10-CM | POA: Diagnosis not present

## 2018-10-06 DIAGNOSIS — I10 Essential (primary) hypertension: Secondary | ICD-10-CM | POA: Insufficient documentation

## 2018-10-06 DIAGNOSIS — Z1159 Encounter for screening for other viral diseases: Secondary | ICD-10-CM | POA: Diagnosis not present

## 2018-10-06 DIAGNOSIS — M7542 Impingement syndrome of left shoulder: Secondary | ICD-10-CM | POA: Diagnosis not present

## 2018-10-06 DIAGNOSIS — M25512 Pain in left shoulder: Secondary | ICD-10-CM | POA: Diagnosis not present

## 2018-10-06 DIAGNOSIS — G8918 Other acute postprocedural pain: Secondary | ICD-10-CM | POA: Diagnosis not present

## 2018-10-06 DIAGNOSIS — S46012A Strain of muscle(s) and tendon(s) of the rotator cuff of left shoulder, initial encounter: Secondary | ICD-10-CM | POA: Diagnosis not present

## 2018-10-06 HISTORY — PX: SHOULDER ARTHROSCOPY WITH ROTATOR CUFF REPAIR AND SUBACROMIAL DECOMPRESSION: SHX5686

## 2018-10-06 HISTORY — PX: BICEPT TENODESIS: SHX5116

## 2018-10-06 SURGERY — SHOULDER ARTHROSCOPY WITH ROTATOR CUFF REPAIR AND SUBACROMIAL DECOMPRESSION
Anesthesia: General | Site: Shoulder | Laterality: Left

## 2018-10-06 MED ORDER — PROPOFOL 500 MG/50ML IV EMUL
INTRAVENOUS | Status: AC
  Filled 2018-10-06: qty 50

## 2018-10-06 MED ORDER — BUPIVACAINE LIPOSOME 1.3 % IJ SUSP
INTRAMUSCULAR | Status: DC | PRN
  Administered 2018-10-06: 10 mL via PERINEURAL

## 2018-10-06 MED ORDER — PROMETHAZINE HCL 25 MG/ML IJ SOLN: 6.2500 mg | INTRAMUSCULAR | Status: DC | PRN

## 2018-10-06 MED ORDER — PROPOFOL 10 MG/ML IV BOLUS
INTRAVENOUS | Status: DC | PRN
  Administered 2018-10-06: 200 mg via INTRAVENOUS

## 2018-10-06 MED ORDER — DEXAMETHASONE SODIUM PHOSPHATE 4 MG/ML IJ SOLN
INTRAMUSCULAR | Status: DC | PRN
  Administered 2018-10-06: 10 mg via INTRAVENOUS

## 2018-10-06 MED ORDER — CEFAZOLIN SODIUM-DEXTROSE 2-4 GM/100ML-% IV SOLN
2.0000 g | INTRAVENOUS | Status: AC
  Administered 2018-10-06: 08:00:00 3 g via INTRAVENOUS

## 2018-10-06 MED ORDER — SODIUM CHLORIDE 0.9 % IV SOLN
INTRAVENOUS | Status: DC | PRN
  Administered 2018-10-06: 50 ug/min via INTRAVENOUS

## 2018-10-06 MED ORDER — OXYCODONE HCL 5 MG PO TABS: ORAL_TABLET | ORAL | 0 refills | Status: AC

## 2018-10-06 MED ORDER — EPINEPHRINE PF 1 MG/ML IJ SOLN
INTRAMUSCULAR | Status: AC
  Filled 2018-10-06: qty 8

## 2018-10-06 MED ORDER — MELOXICAM 7.5 MG PO TABS: 7.5000 mg | ORAL_TABLET | Freq: Every day | ORAL | 2 refills | Status: AC

## 2018-10-06 MED ORDER — ROCURONIUM BROMIDE 10 MG/ML (PF) SYRINGE
PREFILLED_SYRINGE | INTRAVENOUS | Status: AC
  Filled 2018-10-06: qty 10

## 2018-10-06 MED ORDER — MIDAZOLAM HCL 2 MG/2ML IJ SOLN
1.0000 mg | INTRAMUSCULAR | Status: DC | PRN
  Administered 2018-10-06: 2 mg via INTRAVENOUS

## 2018-10-06 MED ORDER — ONDANSETRON HCL 4 MG/2ML IJ SOLN
INTRAMUSCULAR | Status: DC | PRN
  Administered 2018-10-06: 4 mg via INTRAVENOUS

## 2018-10-06 MED ORDER — FENTANYL CITRATE (PF) 100 MCG/2ML IJ SOLN
50.0000 ug | INTRAMUSCULAR | Status: DC | PRN
  Administered 2018-10-06: 50 ug via INTRAVENOUS

## 2018-10-06 MED ORDER — BUPIVACAINE HCL (PF) 0.5 % IJ SOLN
INTRAMUSCULAR | Status: DC | PRN
  Administered 2018-10-06: 12 mL

## 2018-10-06 MED ORDER — SODIUM CHLORIDE 0.9 % IR SOLN
Status: DC | PRN
  Administered 2018-10-06: 10000 mL

## 2018-10-06 MED ORDER — LACTATED RINGERS IV SOLN: INTRAVENOUS | Status: DC

## 2018-10-06 MED ORDER — ROCURONIUM BROMIDE 100 MG/10ML IV SOLN
INTRAVENOUS | Status: DC | PRN
  Administered 2018-10-06: 50 mg via INTRAVENOUS
  Administered 2018-10-06: 10 mg via INTRAVENOUS

## 2018-10-06 MED ORDER — ACETAMINOPHEN 10 MG/ML IV SOLN: 1000.0000 mg | Freq: Once | INTRAVENOUS | Status: DC | PRN

## 2018-10-06 MED ORDER — FENTANYL CITRATE (PF) 100 MCG/2ML IJ SOLN
INTRAMUSCULAR | Status: AC
  Filled 2018-10-06: qty 2

## 2018-10-06 MED ORDER — ONDANSETRON HCL 4 MG/2ML IJ SOLN
INTRAMUSCULAR | Status: AC
  Filled 2018-10-06: qty 2

## 2018-10-06 MED ORDER — SUGAMMADEX SODIUM 200 MG/2ML IV SOLN
INTRAVENOUS | Status: DC | PRN
  Administered 2018-10-06: 200 mg via INTRAVENOUS

## 2018-10-06 MED ORDER — FENTANYL CITRATE (PF) 100 MCG/2ML IJ SOLN: 25.0000 ug | INTRAMUSCULAR | Status: DC | PRN

## 2018-10-06 MED ORDER — CHLORHEXIDINE GLUCONATE 4 % EX LIQD: 60.0000 mL | Freq: Once | CUTANEOUS | Status: DC

## 2018-10-06 MED ORDER — ACETAMINOPHEN 160 MG/5ML PO SOLN: 325.0000 mg | Freq: Once | ORAL | Status: DC | PRN

## 2018-10-06 MED ORDER — OXYCODONE HCL 5 MG PO TABS: 5.0000 mg | ORAL_TABLET | Freq: Once | ORAL | Status: DC | PRN

## 2018-10-06 MED ORDER — OXYCODONE HCL 5 MG/5ML PO SOLN: 5.0000 mg | Freq: Once | ORAL | Status: DC | PRN

## 2018-10-06 MED ORDER — LIDOCAINE HCL (CARDIAC) PF 100 MG/5ML IV SOSY
PREFILLED_SYRINGE | INTRAVENOUS | Status: DC | PRN
  Administered 2018-10-06: 50 mg via INTRAVENOUS

## 2018-10-06 MED ORDER — CEFAZOLIN SODIUM-DEXTROSE 1-4 GM/50ML-% IV SOLN
INTRAVENOUS | Status: AC
  Filled 2018-10-06: qty 50

## 2018-10-06 MED ORDER — ONDANSETRON HCL 4 MG PO TABS: 4.0000 mg | ORAL_TABLET | Freq: Three times a day (TID) | ORAL | 1 refills | Status: AC | PRN

## 2018-10-06 MED ORDER — MEPERIDINE HCL 25 MG/ML IJ SOLN: 6.2500 mg | INTRAMUSCULAR | Status: DC | PRN

## 2018-10-06 MED ORDER — ACETAMINOPHEN 325 MG PO TABS: 325.0000 mg | ORAL_TABLET | Freq: Once | ORAL | Status: DC | PRN

## 2018-10-06 MED ORDER — MIDAZOLAM HCL 2 MG/2ML IJ SOLN
INTRAMUSCULAR | Status: AC
  Filled 2018-10-06: qty 2

## 2018-10-06 MED ORDER — PHENYLEPHRINE HCL (PRESSORS) 10 MG/ML IV SOLN
INTRAVENOUS | Status: AC
  Filled 2018-10-06: qty 1

## 2018-10-06 MED ORDER — LIDOCAINE 2% (20 MG/ML) 5 ML SYRINGE
INTRAMUSCULAR | Status: AC
  Filled 2018-10-06: qty 5

## 2018-10-06 MED ORDER — DEXAMETHASONE SODIUM PHOSPHATE 10 MG/ML IJ SOLN
INTRAMUSCULAR | Status: AC
  Filled 2018-10-06: qty 1

## 2018-10-06 MED ORDER — ACETAMINOPHEN 500 MG PO TABS: 1000.0000 mg | ORAL_TABLET | Freq: Three times a day (TID) | ORAL | 0 refills | Status: AC

## 2018-10-06 MED ORDER — CEFAZOLIN SODIUM-DEXTROSE 2-4 GM/100ML-% IV SOLN
INTRAVENOUS | Status: AC
  Filled 2018-10-06: qty 100

## 2018-10-06 MED ORDER — LACTATED RINGERS IV SOLN
INTRAVENOUS | Status: DC
  Administered 2018-10-06: 08:00:00 via INTRAVENOUS

## 2018-10-06 MED ORDER — SUGAMMADEX SODIUM 500 MG/5ML IV SOLN
INTRAVENOUS | Status: AC
  Filled 2018-10-06: qty 5

## 2018-10-06 SURGICAL SUPPLY — 64 items
ANCHOR SUT 1.8 FBRTK KNTLS 2SU (Anchor) ×2 IMPLANT
BLADE EXCALIBUR 4.0X13 (MISCELLANEOUS) ×2 IMPLANT
BURR OVAL 8 FLU 4.0X13 (MISCELLANEOUS) ×1 IMPLANT
CANNULA PASSPORT 5 (CANNULA) ×1 IMPLANT
CANNULA PASSPORT BUTTON 10-40 (CANNULA) IMPLANT
CANNULA TWIST IN 8.25X7CM (CANNULA) IMPLANT
CHLORAPREP W/TINT 26 (MISCELLANEOUS) ×2 IMPLANT
COVER WAND RF STERILE (DRAPES) IMPLANT
DECANTER SPIKE VIAL GLASS SM (MISCELLANEOUS) IMPLANT
DISSECTOR 3.5MM X 13CM CVD (MISCELLANEOUS) IMPLANT
DISSECTOR 4.0MMX13CM CVD (MISCELLANEOUS) IMPLANT
DRAPE HALF SHEET 70X43 (DRAPES) ×2 IMPLANT
DRAPE IMP U-DRAPE 54X76 (DRAPES) ×2 IMPLANT
DRAPE INCISE IOBAN 66X45 STRL (DRAPES) IMPLANT
DRAPE SHOULDER BEACH CHAIR (DRAPES) ×2 IMPLANT
DRSG PAD ABDOMINAL 8X10 ST (GAUZE/BANDAGES/DRESSINGS) ×2 IMPLANT
DW OUTFLOW CASSETTE/TUBE SET (MISCELLANEOUS) ×2 IMPLANT
GAUZE SPONGE 4X4 12PLY STRL (GAUZE/BANDAGES/DRESSINGS) ×2 IMPLANT
GAUZE XEROFORM 1X8 LF (GAUZE/BANDAGES/DRESSINGS) IMPLANT
GLOVE BIO SURGEON STRL SZ 6.5 (GLOVE) ×2 IMPLANT
GLOVE BIOGEL PI IND STRL 6.5 (GLOVE) ×1 IMPLANT
GLOVE BIOGEL PI IND STRL 7.0 (GLOVE) IMPLANT
GLOVE BIOGEL PI IND STRL 8 (GLOVE) ×1 IMPLANT
GLOVE BIOGEL PI INDICATOR 6.5 (GLOVE)
GLOVE BIOGEL PI INDICATOR 7.0 (GLOVE) ×3
GLOVE BIOGEL PI INDICATOR 8 (GLOVE) ×1
GLOVE ECLIPSE 6.5 STRL STRAW (GLOVE) ×1 IMPLANT
GLOVE ECLIPSE 8.0 STRL XLNG CF (GLOVE) ×2 IMPLANT
GOWN STRL REUS W/ TWL LRG LVL3 (GOWN DISPOSABLE) ×1 IMPLANT
GOWN STRL REUS W/TWL LRG LVL3 (GOWN DISPOSABLE) ×1
GOWN STRL REUS W/TWL XL LVL3 (GOWN DISPOSABLE) ×2 IMPLANT
IMPL SPEEDBRIDGE KIT (Orthopedic Implant) IMPLANT
IMPLANT SPEEDBRIDGE KIT (Orthopedic Implant) ×2 IMPLANT
IV NS IRRIG 3000ML ARTHROMATIC (IV SOLUTION) ×3 IMPLANT
KIT STABILIZATION SHOULDER (MISCELLANEOUS) ×2 IMPLANT
KIT STR SPEAR 1.8 FBRTK DISP (KITS) ×1 IMPLANT
LASSO 90 CVE QUICKPAS (DISPOSABLE) IMPLANT
MANIFOLD NEPTUNE II (INSTRUMENTS) ×2 IMPLANT
NDL SAFETY ECLIPSE 18X1.5 (NEEDLE) ×1 IMPLANT
NDL SCORPION MULTI FIRE (NEEDLE) IMPLANT
NDL SUT 6 .5 CRC .975X.05 MAYO (NEEDLE) IMPLANT
NEEDLE HYPO 18GX1.5 SHARP (NEEDLE) ×1
NEEDLE MAYO TAPER (NEEDLE)
NEEDLE SCORPION MULTI FIRE (NEEDLE) IMPLANT
PACK ARTHROSCOPY DSU (CUSTOM PROCEDURE TRAY) ×2 IMPLANT
PACK BASIN DAY SURGERY FS (CUSTOM PROCEDURE TRAY) ×2 IMPLANT
PORT APPOLLO RF 90DEGREE MULTI (SURGICAL WAND) ×2 IMPLANT
RESTRAINT HEAD UNIVERSAL NS (MISCELLANEOUS) ×2 IMPLANT
SLEEVE SCD COMPRESS KNEE MED (MISCELLANEOUS) ×2 IMPLANT
SLING ARM FOAM STRAP LRG (SOFTGOODS) IMPLANT
SPONGE LAP 4X18 RFD (DISPOSABLE) IMPLANT
STRIP CLOSURE SKIN 1/2X4 (GAUZE/BANDAGES/DRESSINGS) ×1 IMPLANT
SUT FIBERWIRE #2 38 T-5 BLUE (SUTURE)
SUT MNCRL AB 4-0 PS2 18 (SUTURE) ×2 IMPLANT
SUT PDS AB 1 CT  36 (SUTURE)
SUT PDS AB 1 CT 36 (SUTURE) IMPLANT
SUT TIGER TAPE 7 IN WHITE (SUTURE) IMPLANT
SUTURE FIBERWR #2 38 T-5 BLUE (SUTURE) IMPLANT
SUTURE TAPE TIGERLINK 1.3MM BL (SUTURE) IMPLANT
SUTURETAPE TIGERLINK 1.3MM BL (SUTURE)
SYR 5ML LL (SYRINGE) ×2 IMPLANT
TAPE FIBER 2MM 7IN #2 BLUE (SUTURE) IMPLANT
TOWEL GREEN STERILE FF (TOWEL DISPOSABLE) ×2 IMPLANT
TUBING ARTHROSCOPY IRRIG 16FT (MISCELLANEOUS) ×2 IMPLANT

## 2018-10-06 NOTE — Anesthesia Procedure Notes (Signed)
Procedure Name: Intubation Performed by: Terrance Mass, CRNA Pre-anesthesia Checklist: Patient identified, Emergency Drugs available, Suction available and Patient being monitored Patient Re-evaluated:Patient Re-evaluated prior to induction Oxygen Delivery Method: Circle system utilized Preoxygenation: Pre-oxygenation with 100% oxygen Induction Type: IV induction Ventilation: Mask ventilation without difficulty Grade View: Grade I Tube type: Oral Tube size: 7.0 mm Number of attempts: 2 Airway Equipment and Method: Stylet Placement Confirmation: ETT inserted through vocal cords under direct vision,  positive ETCO2 and breath sounds checked- equal and bilateral Secured at: 24 cm Tube secured with: Tape Dental Injury: Teeth and Oropharynx as per pre-operative assessment

## 2018-10-06 NOTE — H&P (Signed)
PREOPERATIVE H&P  Chief Complaint: LEFT SHOULDER ROTATOR CUFF TEAR  HPI: Jerry Escobar is a 48 y.o. male who presents for preoperative history and physical with a diagnosis of LEFT SHOULDER [place]. Symptoms are rated as moderate to severe, and have been worsening.  This is significantly impairing activities of daily living.  Please see my clinic note for full details on this patient's care.  He has elected for surgical management.   Past Medical History:  Diagnosis Date  . Hyperlipidemia   . Hypertension   . Seasonal allergies   . Transaminitis    Past Surgical History:  Procedure Laterality Date  . HERNIA REPAIR    . KNEE ARTHROSCOPY    . ROTATOR CUFF REPAIR     Social History   Socioeconomic History  . Marital status: Single    Spouse name: Not on file  . Number of children: 3  . Years of education: Not on file  . Highest education level: Not on file  Occupational History  . Not on file  Social Needs  . Financial resource strain: Not on file  . Food insecurity    Worry: Not on file    Inability: Not on file  . Transportation needs    Medical: Not on file    Non-medical: Not on file  Tobacco Use  . Smoking status: Never Smoker  . Smokeless tobacco: Never Used  Substance and Sexual Activity  . Alcohol use: Yes    Alcohol/week: 3.0 standard drinks    Types: 3 Standard drinks or equivalent per week    Comment: Occasional  . Drug use: No  . Sexual activity: Not on file  Lifestyle  . Physical activity    Days per week: Not on file    Minutes per session: Not on file  . Stress: Not on file  Relationships  . Social Herbalist on phone: Not on file    Gets together: Not on file    Attends religious service: Not on file    Active member of club or organization: Not on file    Attends meetings of clubs or organizations: Not on file    Relationship status: Not on file  Other Topics Concern  . Not on file  Social History Narrative  . Not on  file   Family History  Problem Relation Age of Onset  . Cancer Father        prostate  . Breast cancer Paternal Grandmother    No Known Allergies Prior to Admission medications   Medication Sig Start Date End Date Taking? Authorizing Provider  Ascorbic Acid (VITAMIN C) 1000 MG tablet Take 1,000 mg by mouth daily.   Yes Joline Salt, RN  ibuprofen (ADVIL,MOTRIN) 800 MG tablet Take 1 tablet (800 mg total) by mouth every 8 (eight) hours as needed. 11/04/17  Yes Silverio Decamp, MD  Multiple Vitamins-Minerals (MULTIVITAMIN ADULT PO) Take 1 capsule by mouth daily.   Yes [provider]  Omega-3 Fatty Acids (FISH OIL PO) Take 1 capsule by mouth daily.   Yes [provider]     Positive ROS: All other systems have been reviewed and were otherwise negative with the exception of those mentioned in the HPI and as above.  Physical Exam: General: Alert, no acute distress Cardiovascular: No pedal edema Respiratory: No cyanosis, no use of accessory musculature GI: No organomegaly, abdomen is soft and non-tender Skin: No lesions in the area of chief complaint Neurologic: Sensation  intact distally Psychiatric: Patient is competent for consent with normal mood and affect Lymphatic: No axillary or cervical lymphadenopathy  MUSCULOSKELETAL: L shoulder pain with ROM, +I, O  Assessment: LEFT SHOULDER ROTATOR CUFF TEAR  Plan: Plan for Procedure(s): LEFT SHOULDER ARTHROSCOPY WITH ROTATOR CUFF REPAIR AND SUBACROMIAL DECOMPRESSION, EXTENSIVE DEBRIDEMENT, BICEP TENODESIS  The risks benefits and alternatives were discussed with the patient including but not limited to the risks of nonoperative treatment, versus surgical intervention including infection, bleeding, nerve injury,  blood clots, cardiopulmonary complications, morbidity, mortality, among others, and they were willing to proceed.   Hiram Gash, MD  10/06/2018 7:25 AM

## 2018-10-06 NOTE — Anesthesia Postprocedure Evaluation (Signed)
Anesthesia Post Note  Patient: Jerry Escobar  Procedure(s) Performed: LEFT SHOULDER ARTHROSCOPY WITH ROTATOR CUFF REPAIR AND SUBACROMIAL DECOMPRESSION, EXTENSIVE DEBRIDEMENT, BICEP TENODESIS (Left Shoulder) BICEPS TENODESIS (Left Shoulder)     Patient location during evaluation: PACU Anesthesia Type: General Level of consciousness: awake and alert Pain management: pain level controlled Vital Signs Assessment: post-procedure vital signs reviewed and stable Respiratory status: spontaneous breathing, nonlabored ventilation, respiratory function stable and patient connected to nasal cannula oxygen Cardiovascular status: blood pressure returned to baseline and stable Postop Assessment: no apparent nausea or vomiting Anesthetic complications: no    Last Vitals:  Vitals:   10/06/18 1015 10/06/18 1100  BP: (!) 150/98 (!) 154/98  Pulse: 88 89  Resp: 18 20  Temp:  36.6 C  SpO2: 94% 95%    Last Pain:  Vitals:   10/06/18 1100  TempSrc:   PainSc: 0-No pain                 Effie Berkshire

## 2018-10-06 NOTE — Op Note (Signed)
Orthopaedic Surgery Operative Note (CSN: [id number])  Jerry Escobar  Jun 19, 1970 Date of Surgery: 10/06/2018   Diagnoses:  Left shoulder rotator cuff tear, biceps fraying and impingement  Procedure: Left arthroscopic extensive debridement Arthroscopic rotator cuff repair Arthroscopic biceps tenodesis Subacromial decompression   Operative Finding Exam under anesthesia: Full motion no limitation Articular space: No loose bodies, capsule intact, significant fraying of the anterior labrum that was debrided back to a stable base, extraneous tissue in the anterior interval that was debrided in the interval was opened. Chondral surfaces:Intact, no sign of chondral degeneration on the glenoid or humeral head Biceps: Type III SLAP tear with significant displacement of the biceps with intrasubstance fraying as well. Subscapularis: Intact Superior Cuff: Complete supraspinatus tear with minimal retraction Bursal side: Complete supraspinatus tear with thinning of the tendon, moderate tendon quality  Successful completion of the planned procedure.  Moderate tendon quality but the tear was rather large.  We think that the patient would benefit from a delayed return to therapy 4 to 6 weeks.   Post-operative plan: The patient will be discharged home.  The patient will be nonweightbearing in a sling for 6 weeks with therapy to start around 4 weeks.  DVT prophylaxis not indicated in ambulatory upper extremity patient without known risk factors.   Pain control with PRN pain medication preferring oral medicines.  Follow up plan will be scheduled in approximately 7 days for incision check and XR.  Post-Op Diagnosis: Same Surgeons:Primary: Hiram Gash, MD Assistants:None Location: [facility] OR ROOM 6 Anesthesia: General with Exparel interscalene block Antibiotics: Ancef 2g preop Tourniquet time: * No tourniquets in log * Estimated Blood Loss: Minimal Complications: None Specimens: None Implants: Implant  Name Type Inv. Item Serial No. Manufacturer Lot No. LRB No. Used Action  IMPLANT SPEEDBRIDGE KIT - [id number] Orthopedic Implant IMPLANT SPEEDBRIDGE KIT  [place] [id number] Left 1 Implanted  ANCHOR SUTURE FIBERTAK 1.8 - [id number] Anchor ANCHOR SUTURE FIBERTAK 1.8  [employer] [id number] Left 1 Implanted  ANCHOR SUTURE FIBERTAK 1.8 - [id number] Anchor ANCHOR SUTURE FIBERTAK 1.8  [employer] [id number] Left 1 Implanted    Indications for Surgery:   Jerry Escobar is a 48 y.o. male with continued shoulder pain refractory to nonoperative measures for extended period of time.    The risks and benefits were explained at length including but not limited to continued pain, cuff failure, biceps tenodesis failure, stiffness, need for further surgery and infection.   Procedure:   Patient was correctly identified in the preoperative holding area and operative site marked.  Patient brought to OR and positioned beachchair on an [place] table ensuring that all bony prominences were padded and the head was in an appropriate location.  Anesthesia was induced and the operative shoulder was prepped and draped in the usual sterile fashion.  Timeout was called preincision.  A standard posterior viewing portal was made after localizing the portal with a spinal needle.  An anterior accessory portal was also made.  After clearing the articular space the camera was positioned in the subacromial space.  Findings above.  Subacromial decompression: We made a lateral portal with spinal needle guidance. We then proceeded to debride bursal tissue extensively with a shaver and arthrocare device. At that point we continued to identify the borders of the acromion and identify the spur. We then carefully preserved the deltoid fascia and used a burr to convert the type 2 acromion to a Type 1 flat acromion without issue.  Arthroscopic Rotator Cuff Repair: Tuberosity was prepared  with a burr to a bleeding bed.  Following completion of the  above we placed 2 4.7 Swivelock anchor loaded with a tape at inserted at the medial articular margin and an scorpion suture passing device, shuttled  sutures medially in a horizontal mattress suture configuration.  We then tied using arthroscopic knot tying techniques  each suture to its partner reducing the tendon at the prepared insertion site.  The fiber tape was not tied. With a medial row suture limbs then incorporated, 2 anteriorly and  2 posteriorly, into each of two 4.75 PEEK SwiveLock anchors, each placed 8 to 10 mm below the tip of the tuberosity and spanning anterior-posterior width of the tear with care to avoid over tensioning.   Biceps tenodesis: We marked the tendon and then performed a tenotomy and debridement of the stump in the articular space. We then identified the biceps tendon in its groove suprapec with the arthroscope in the lateral portal taking care to move from lateral to medial to avoid injury to the subscapularis. At that point we unroofed the tendon itself and mobilized it. An accessory anterior portal was made in line with the tendon and we grasped it from the anterior superior portal and worked from the accessory anterior portal. Two Fibertak 1.34m knotless anchors were placed in the groove and the tendon was secured in a luggage loop style fashion with a pass of the limb of suture through the tendon using a scorpion device to avoid pull-through.  Repair was completed with good tension on the tendon.  Residual stump of the tendon was removed after being resected with a RF ablator.    The incisions were closed with absorbable monocryl and steri strips.  A sterile dressing was placed along with a sling. The patient was awoken from general anesthesia and taken to the PACU in stable condition without complication.

## 2018-10-06 NOTE — Anesthesia Procedure Notes (Signed)
Anesthesia Regional Block: Interscalene brachial plexus block   Pre-Anesthetic Checklist: ,, timeout performed, Correct Patient, Correct Site, Correct Laterality, Correct Procedure, Correct Position, site marked, Risks and benefits discussed,  Surgical consent,  Pre-op evaluation,  At surgeon's request and post-op pain management  Laterality: Left  Prep: chloraprep       Needles:  Injection technique: Single-shot  Needle Type: Echogenic Stimulator Needle     Needle Length: 9cm  Needle Gauge: 21     Additional Needles:   Procedures:,,,, ultrasound used (permanent image in chart),,,,  Narrative:  Start time: 10/06/2018 7:15 AM End time: 10/06/2018 7:25 AM Injection made incrementally with aspirations every 5 mL.  Performed by: Personally  Anesthesiologist: Effie Berkshire, MD  Additional Notes: Patient tolerated the procedure well. Local anesthetic introduced in an incremental fashion under minimal resistance after negative aspirations. No paresthesias were elicited. After completion of the procedure, no acute issues were identified and patient continued to be monitored by RN.

## 2018-10-06 NOTE — Discharge Instructions (Signed)
Post Anesthesia Home Care Instructions  Activity: Get plenty of rest for the remainder of the day. A responsible individual must stay with you for 24 hours following the procedure.  For the next 24 hours, DO NOT: -Drive a car -Paediatric nurse -Drink alcoholic beverages -Take any medication unless instructed by your physician -Make any legal decisions or sign important papers.  Meals: Start with liquid foods such as gelatin or soup. Progress to regular foods as tolerated. Avoid greasy, spicy, heavy foods. If nausea and/or vomiting occur, drink only clear liquids until the nausea and/or vomiting subsides. Call your physician if vomiting continues.  Special Instructions/Symptoms: Your throat may feel dry or sore from the anesthesia or the breathing tube placed in your throat during surgery. If this causes discomfort, gargle with warm salt water. The discomfort should disappear within 24 hours.      Information for Discharge Teaching: EXPAREL (bupivacaine liposome injectable suspension)   Your surgeon or anesthesiologist gave you EXPAREL(bupivacaine) to help control your pain after surgery.   EXPAREL is a local anesthetic that provides pain relief by numbing the tissue around the surgical site.  EXPAREL is designed to release pain medication over time and can control pain for up to 72 hours.  Depending on how you respond to EXPAREL, you may require less pain medication during your recovery.  Possible side effects:  Temporary loss of sensation or ability to move in the area where bupivacaine was injected.  Nausea, vomiting, constipation  Rarely, numbness and tingling in your mouth or lips, lightheadedness, or anxiety may occur.  Call your doctor right away if you think you may be experiencing any of these sensations, or if you have other questions regarding possible side effects.  Follow all other discharge instructions given to you by your surgeon or nurse. Eat a healthy diet  and drink plenty of water or other fluids.  If you return to the hospital for any reason within 96 hours following the administration of EXPAREL, it is important for health care providers to know that you have received this anesthetic. A teal colored band has been placed on your arm with the date, time and amount of EXPAREL you have received in order to alert and inform your health care providers. Please leave this armband in place for the full 96 hours following administration, and then you may remove the band.  Regional Anesthesia Blocks  1. Numbness or the inability to move the "blocked" extremity may last from 3-48 hours after placement. The length of time depends on the medication injected and your individual response to the medication. If the numbness is not going away after 48 hours, call your surgeon.  2. The extremity that is blocked will need to be protected until the numbness is gone and the  Strength has returned. Because you cannot feel it, you will need to take extra care to avoid injury. Because it may be weak, you may have difficulty moving it or using it. You may not know what position it is in without looking at it while the block is in effect.  3. For blocks in the legs and feet, returning to weight bearing and walking needs to be done carefully. You will need to wait until the numbness is entirely gone and the strength has returned. You should be able to move your leg and foot normally before you try and bear weight or walk. You will need someone to be with you when you first try to ensure you do not  fall and possibly risk injury.  4. Bruising and tenderness at the needle site are common side effects and will resolve in a few days.  5. Persistent numbness or new problems with movement should be communicated to the surgeon or the [place] [phone] [facility] [phone]).

## 2018-10-06 NOTE — Progress Notes (Signed)
Assisted Dr. Smith Robert with left, ultrasound guided, supraclavicular block. Side rails up, monitors on throughout procedure. See vital signs in flow sheet. Tolerated Procedure well.

## 2018-10-06 NOTE — Transfer of Care (Signed)
Immediate Anesthesia Transfer of Care Note  Patient: Jerry Escobar  Procedure(s) Performed: LEFT SHOULDER ARTHROSCOPY WITH ROTATOR CUFF REPAIR AND SUBACROMIAL DECOMPRESSION, EXTENSIVE DEBRIDEMENT, BICEP TENODESIS (Left Shoulder) BICEPS TENODESIS (Left Shoulder)  Patient Location: PACU  Anesthesia Type:General and Regional  Level of Consciousness: awake and sedated  Airway & Oxygen Therapy: Patient Spontanous Breathing and Patient connected to face mask oxygen  Post-op Assessment: Report given to RN and Post -op Vital signs reviewed and stable  Post vital signs: Reviewed and stable  Last Vitals:  Vitals Value Taken Time  BP 155/83 10/06/18 0925  Temp    Pulse 87 10/06/18 0926  Resp 16 10/06/18 0927  SpO2 96 % 10/06/18 0926  Vitals shown include unvalidated device data.  Last Pain:  Vitals:   10/06/18 0634  TempSrc: Oral  PainSc: 0-No pain         Complications: No apparent anesthesia complications

## 2018-10-06 NOTE — Anesthesia Preprocedure Evaluation (Addendum)
Anesthesia Evaluation  Patient identified by MRN, date of birth, ID band Patient awake    Reviewed: Allergy & Precautions, NPO status , Patient's Chart, lab work & pertinent test results  Airway Mallampati: II  TM Distance: >3 FB Neck ROM: Full    Dental  (+) Teeth Intact, Dental Advisory Given   Pulmonary    breath sounds clear to auscultation       Cardiovascular hypertension,  Rhythm:Regular Rate:Normal     Neuro/Psych  Neuromuscular disease negative psych ROS   GI/Hepatic negative GI ROS, Neg liver ROS,   Endo/Other  negative endocrine ROS  Renal/GU negative Renal ROS     Musculoskeletal  (+) Arthritis ,   Abdominal (+) + obese,   Peds  Hematology   Anesthesia Other Findings   Reproductive/Obstetrics                            Lab Results  Component Value Date   WBC 5.5 11/04/2017   HGB 14.0 11/04/2017   HCT 42.0 11/04/2017   MCV 80.3 11/04/2017   PLT 191 11/04/2017   COVID-19 Labs  No results for input(s): DDIMER, FERRITIN, LDH, CRP in the last 72 hours.  Lab Results  Component Value Date   SARSCOV2NAA NOT DETECTED 10/03/2018     Anesthesia Physical Anesthesia Plan  ASA: II  Anesthesia Plan: General   Post-op Pain Management: GA combined w/ Regional for post-op pain   Induction: Intravenous  PONV Risk Score and Plan: 3 and Ondansetron, Dexamethasone and Midazolam  Airway Management Planned: Oral ETT  Additional Equipment: None  Intra-op Plan:   Post-operative Plan: Extubation in OR  Informed Consent: I have reviewed the patients History and Physical, chart, labs and discussed the procedure including the risks, benefits and alternatives for the proposed anesthesia with the patient or authorized representative who has indicated his/her understanding and acceptance.     Dental advisory given  Plan Discussed with: CRNA  Anesthesia Plan Comments:         Anesthesia Quick Evaluation

## 2018-10-10 ENCOUNTER — Encounter (HOSPITAL_BASED_OUTPATIENT_CLINIC_OR_DEPARTMENT_OTHER): Payer: Self-pay | Admitting: Orthopaedic Surgery

## 2018-10-13 DIAGNOSIS — M24112 Other articular cartilage disorders, left shoulder: Secondary | ICD-10-CM | POA: Diagnosis not present

## 2018-10-17 ENCOUNTER — Ambulatory Visit (INDEPENDENT_AMBULATORY_CARE_PROVIDER_SITE_OTHER): Payer: BC Managed Care – PPO | Admitting: Sports Medicine

## 2018-10-17 ENCOUNTER — Ambulatory Visit (INDEPENDENT_AMBULATORY_CARE_PROVIDER_SITE_OTHER): Payer: BC Managed Care – PPO

## 2018-10-17 ENCOUNTER — Encounter: Payer: Self-pay | Admitting: Sports Medicine

## 2018-10-17 ENCOUNTER — Other Ambulatory Visit: Payer: Self-pay

## 2018-10-17 DIAGNOSIS — M75112 Incomplete rotator cuff tear or rupture of left shoulder, not specified as traumatic: Secondary | ICD-10-CM | POA: Diagnosis not present

## 2018-10-17 DIAGNOSIS — I10 Essential (primary) hypertension: Secondary | ICD-10-CM | POA: Diagnosis not present

## 2018-10-17 DIAGNOSIS — R202 Paresthesia of skin: Secondary | ICD-10-CM

## 2018-10-17 DIAGNOSIS — R200 Anesthesia of skin: Secondary | ICD-10-CM

## 2018-10-17 NOTE — Assessment & Plan Note (Addendum)
Numbness and tingling on the left side of the upper lip, combined with elevated blood pressure I do have a question of a small lacunar stroke, symptoms have been present now for about 11 days.  Brain MRI is negative for acute stroke, we can treat his blood pressure conservatively.

## 2018-10-17 NOTE — Assessment & Plan Note (Signed)
Status post biceps tenodesis, rotator cuff repair, acromioplasty with Dr. Griffin Basil back on September 17.

## 2018-10-17 NOTE — Assessment & Plan Note (Signed)
Persistently elevated, rechecking.

## 2018-10-17 NOTE — Progress Notes (Signed)
Subjective:    CC: Follow-up  HPI: Doing returns, he is a pleasant 48 year old male, he had left-sided rotator cuff surgery and is doing well, unfortunately he woke up in the PACU with numbness in his left upper lip.  It has been persistent, only minimal improvement.  In addition his blood pressure has been highly elevated, no headaches, visual changes, chest pain.  I reviewed the past medical history, family history, social history, surgical history, and allergies today and no changes were needed.  Please see the problem list section below in epic for further details.  Past Medical History: Past Medical History:  Diagnosis Date  . Hyperlipidemia   . Hypertension   . Seasonal allergies   . Transaminitis    Past Surgical History: Past Surgical History:  Procedure Laterality Date  . BICEPT TENODESIS Left 10/06/2018   Procedure: BICEPS TENODESIS;  Surgeon: Hiram Gash, MD;  Location: [facility];  Service: Orthopedics;  Laterality: Left;  . HERNIA REPAIR    . KNEE ARTHROSCOPY    . ROTATOR CUFF REPAIR    . SHOULDER ARTHROSCOPY WITH ROTATOR CUFF REPAIR AND SUBACROMIAL DECOMPRESSION Left 10/06/2018   Procedure: LEFT SHOULDER ARTHROSCOPY WITH ROTATOR CUFF REPAIR AND SUBACROMIAL DECOMPRESSION, EXTENSIVE DEBRIDEMENT, BICEP TENODESIS;  Surgeon: Hiram Gash, MD;  Location: [facility];  Service: Orthopedics;  Laterality: Left;  BLOCK   Social History: Social History   Socioeconomic History  . Marital status: Single    Spouse name: Not on file  . Number of children: 3  . Years of education: Not on file  . Highest education level: Not on file  Occupational History  . Not on file  Social Needs  . Financial resource strain: Not on file  . Food insecurity    Worry: Not on file    Inability: Not on file  . Transportation needs    Medical: Not on file    Non-medical: Not on file  Tobacco Use  . Smoking status: Never Smoker  . Smokeless tobacco: Never  Used  Substance and Sexual Activity  . Alcohol use: Yes    Alcohol/week: 3.0 standard drinks    Types: 3 Standard drinks or equivalent per week    Comment: Occasional  . Drug use: No  . Sexual activity: Not on file  Lifestyle  . Physical activity    Days per week: Not on file    Minutes per session: Not on file  . Stress: Not on file  Relationships  . Social Herbalist on phone: Not on file    Gets together: Not on file    Attends religious service: Not on file    Active member of club or organization: Not on file    Attends meetings of clubs or organizations: Not on file    Relationship status: Not on file  Other Topics Concern  . Not on file  Social History Narrative  . Not on file   Family History: Family History  Problem Relation Age of Onset  . Cancer Father        prostate  . Breast cancer Paternal Grandmother    Allergies: No Known Allergies Medications: See med rec.  Review of Systems: No fevers, chills, night sweats, weight loss, chest pain, or shortness of breath.   Objective:    General: Well Developed, well nourished, and in no acute distress.  Neuro: Alert and oriented x3, extra-ocular muscles intact, sensation grossly intact.  HEENT: Normocephalic, atraumatic, pupils equal  round reactive to light, neck supple, no masses, no lymphadenopathy, thyroid nonpalpable.  Skin: Warm and dry, no rashes. Cardiac: Regular rate and rhythm, no murmurs rubs or gallops, no lower extremity edema.  Respiratory: Clear to auscultation bilaterally. Not using accessory muscles, speaking in full sentences.  Impression and Recommendations:    Essential hypertension, benign Persistently elevated, rechecking.  Rotator cuff tear, left Status post biceps tenodesis, rotator cuff repair, acromioplasty with Dr. Griffin Basil back on September 17.  Numbness and tingling of left side of face Numbness and tingling on the left side of the upper lip, combined with elevated blood  pressure I do have a question of a small lacunar stroke, symptoms have been present now for about 11 days.  Brain MRI is negative for acute stroke, we can treat his blood pressure conservatively.   ___________________________________________ Gwen Her. Dianah Field, M.D., ABFM., CAQSM. Primary Care and Sports Medicine [facility]  Adjunct Professor of [employer] [employer]

## 2018-10-18 LAB — LIPID PANEL W/REFLEX DIRECT LDL
Cholesterol: 198 mg/dL (ref <200)
HDL: 48 mg/dL (ref >=40)
LDL Cholesterol (Calc): 125 mg/dL (calc) — ABNORMAL HIGH
Non-HDL Cholesterol (Calc): 150 mg/dL (calc) — ABNORMAL HIGH (ref <130)
Total CHOL/HDL Ratio: 4.1 (calc) (ref <5.0)
Triglycerides: 134 mg/dL (ref <150)

## 2018-10-18 LAB — CBC
HCT: 43.6 % (ref 38.5–50.0)
Hemoglobin: 14.6 g/dL (ref 13.2–17.1)
MCH: 27.7 pg (ref 27.0–33.0)
MCHC: 33.5 g/dL (ref 32.0–36.0)
MCV: 82.6 fL (ref 80.0–100.0)
MPV: 11.5 fL (ref 7.5–12.5)
Platelets: 222 10*3/uL (ref 140–400)
RBC: 5.28 10*6/uL (ref 4.20–5.80)
RDW: 13.8 % (ref 11.0–15.0)
WBC: 5.8 10*3/uL (ref 3.8–10.8)

## 2018-10-18 LAB — COMPLETE METABOLIC PANEL WITH GFR
AG Ratio: 1.5 (calc) (ref 1.0–2.5)
ALT: 27 U/L (ref 9–46)
AST: 24 U/L (ref 10–40)
Albumin: 4.4 g/dL (ref 3.6–5.1)
Alkaline phosphatase (APISO): 51 U/L (ref 36–130)
BUN: 20 mg/dL (ref 7–25)
CO2: 26 mmol/L (ref 20–32)
Calcium: 9.3 mg/dL (ref 8.6–10.3)
Chloride: 105 mmol/L (ref 98–110)
Creat: 1.19 mg/dL (ref 0.60–1.35)
GFR, Est African American: 84 mL/min/{1.73_m2} (ref >=60)
GFR, Est Non African American: 72 mL/min/{1.73_m2} (ref >=60)
Globulin: 3 g/dL (calc) (ref 1.9–3.7)
Glucose, Bld: 95 mg/dL (ref 65–99)
Potassium: 4.1 mmol/L (ref 3.5–5.3)
Sodium: 140 mmol/L (ref 135–146)
Total Bilirubin: 0.6 mg/dL (ref 0.2–1.2)
Total Protein: 7.4 g/dL (ref 6.1–8.1)

## 2018-10-18 LAB — TSH: TSH: 0.93 mIU/L (ref 0.40–4.50)

## 2018-10-18 LAB — VITAMIN D 25 HYDROXY (VIT D DEFICIENCY, FRACTURES): Vit D, 25-Hydroxy: 12 ng/mL — ABNORMAL LOW (ref 30–100)

## 2018-10-18 LAB — HEMOGLOBIN A1C
Hgb A1c MFr Bld: 5.6 % of total Hgb (ref <5.7)
Mean Plasma Glucose: 114 (calc)
eAG (mmol/L): 6.3 (calc)

## 2018-10-18 MED ORDER — VITAMIN D (ERGOCALCIFEROL) 1.25 MG (50000 UNIT) PO CAPS: 50000.0000 [IU] | ORAL_CAPSULE | ORAL | 0 refills | Status: DC

## 2018-10-18 NOTE — Addendum Note (Signed)
Addended by: Silverio Decamp on: 10/18/2018 09:24 AM   Modules accepted: Orders

## 2018-10-24 ENCOUNTER — Other Ambulatory Visit: Payer: Self-pay

## 2018-10-24 ENCOUNTER — Ambulatory Visit (INDEPENDENT_AMBULATORY_CARE_PROVIDER_SITE_OTHER): Payer: BC Managed Care – PPO | Admitting: Sports Medicine

## 2018-10-24 DIAGNOSIS — I10 Essential (primary) hypertension: Secondary | ICD-10-CM | POA: Diagnosis not present

## 2018-10-24 MED ORDER — LISINOPRIL-HYDROCHLOROTHIAZIDE 10-12.5 MG PO TABS: 1.0000 | ORAL_TABLET | Freq: Every day | ORAL | 3 refills | Status: DC

## 2018-10-24 NOTE — Assessment & Plan Note (Signed)
Persistently elevated, starting lisinopril/HCTZ low-dose. Nurse visit in 2 weeks.

## 2018-10-24 NOTE — Patient Instructions (Signed)
2

## 2018-10-24 NOTE — Progress Notes (Signed)
Patient in today for BP check. BP at last visit was 165/100. Patients BP in office today is 169/117 and 164/113. Pt denies headaches, blurred vision, chest pain and shortness of breath. Per pt provider pt is to start Lisinopril/HCTZ .   Pt is to follow up with provider 2 weeks after starting new medication. Pt expressed understanding.

## 2018-11-07 ENCOUNTER — Other Ambulatory Visit: Payer: Self-pay

## 2018-11-07 ENCOUNTER — Encounter: Payer: Self-pay | Admitting: Physical Therapy

## 2018-11-07 ENCOUNTER — Ambulatory Visit (INDEPENDENT_AMBULATORY_CARE_PROVIDER_SITE_OTHER): Payer: BC Managed Care – PPO | Admitting: Physical Therapy

## 2018-11-07 DIAGNOSIS — M25612 Stiffness of left shoulder, not elsewhere classified: Secondary | ICD-10-CM

## 2018-11-07 DIAGNOSIS — M25512 Pain in left shoulder: Secondary | ICD-10-CM | POA: Diagnosis not present

## 2018-11-07 DIAGNOSIS — M6281 Muscle weakness (generalized): Secondary | ICD-10-CM | POA: Diagnosis not present

## 2018-11-07 DIAGNOSIS — R600 Localized edema: Secondary | ICD-10-CM

## 2018-11-07 DIAGNOSIS — R293 Abnormal posture: Secondary | ICD-10-CM

## 2018-11-07 NOTE — Therapy (Addendum)
[place], [place], [address] Phone: [phone]   Fax:  [phone]  Physical Therapy Evaluation  Patient Details  Name: Jerry Escobar MRN: [id number] Date of Birth: [birth year] Referring Provider (PT): Dr. Griffin Basil   Encounter Date: 11/07/2018  PT End of Session - 11/07/18 0847    Visit Number  1    Number of Visits  12    Date for PT Re-Evaluation  12/19/18    PT Start Time  0848    PT Stop Time  0944    PT Time Calculation (min)  56 min    Activity Tolerance  Patient tolerated treatment well       Past Medical History:  Diagnosis Date  . Hyperlipidemia   . Hypertension   . Seasonal allergies   . Transaminitis     Past Surgical History:  Procedure Laterality Date  . BICEPT TENODESIS Left 10/06/2018   Procedure: BICEPS TENODESIS;  Surgeon: Hiram Gash, MD;  Location: [place] [facility];  Service: Orthopedics;  Laterality: Left;  . HERNIA REPAIR    . KNEE ARTHROSCOPY    . ROTATOR CUFF REPAIR    . SHOULDER ARTHROSCOPY WITH ROTATOR CUFF REPAIR AND SUBACROMIAL DECOMPRESSION Left 10/06/2018   Procedure: LEFT SHOULDER ARTHROSCOPY WITH ROTATOR CUFF REPAIR AND SUBACROMIAL DECOMPRESSION, EXTENSIVE DEBRIDEMENT, BICEP TENODESIS;  Surgeon: Hiram Gash, MD;  Location: [facility];  Service: Orthopedics;  Laterality: Left;  BLOCK    There were no vitals filed for this visit.   Subjective Assessment - 11/07/18 0853    Subjective  Patient had left RCR, SAD, biceps tenodesis on 10/06/18. He denies pain, but it feels tight. Patient has sling but did not bring it today. He has been compliant with wearing it, but doctor said he could wean from it 3wks from 10/13/18.    Pertinent History  L RCR 10/06/18; Rt shoulder RCR ~ 6 yrs ago; Rt knee surgery - scope x 2 most recent ~ 12 yrs ago; HTN    Patient Stated Goals  get motion and strength back    Currently in Pain?  No/denies          [facility] PT Assessment - 11/07/18 0001      Assessment   Medical Diagnosis  Lt RCR, biceps tenodesis, SAD    Referring Provider (PT)  Dr. Griffin Basil    Onset Date/Surgical Date  10/06/18    Hand Dominance  Right    Next MD Visit  11/17/18    Prior Therapy  for right shoulder      Precautions   Precautions  Shoulder    Precaution Comments  per protocol      Balance Screen   Has the patient fallen in the past 6 months  No    Has the patient had a decrease in activity level because of a fear of falling?   No    Is the patient reluctant to leave their home because of a fear of falling?   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     Leisure  works with [employer] ~ 30 hours/wk       Observation/Other Assessments   Focus on Therapeutic Outcomes (FOTO)  66% limitation      Sensation   Additional Comments  WNL      Posture/Postural Control   Posture Comments  head forward; shoudlers rounded and elevated; head of the humerus anterior in orientation; scapulae abducted along the thoracic wall       ROM / Strength   AROM / PROM / Strength  AROM;PROM      AROM   AROM Assessment Site  Shoulder;Elbow    Right/Left Shoulder  Left    Right/Left Elbow  Left    Left Elbow Flexion  135    Left Elbow Extension  -10      PROM   PROM Assessment Site  Shoulder;Elbow    Right/Left Shoulder  Left    Left Shoulder Flexion  100 Degrees    Left Shoulder ABduction  96 Degrees    Left Shoulder Internal Rotation  60 Degrees    Left Shoulder External Rotation  30 Degrees    Right/Left Elbow  Left    Left Elbow Flexion  140    Left Elbow Extension  -7      Strength   Overall Strength Comments  not tested                 Objective measurements completed on examination: See above findings.       [facility] Adult PT Treatment/Exercise - 11/07/18 0001      Modalities   Modalities  Vasopneumatic      Vasopneumatic   Number Minutes Vasopneumatic   10 minutes    Vasopnuematic Location   Shoulder    Vasopneumatic Pressure  Low    Vasopneumatic Temperature   34 deg             PT Education - 11/07/18 0933    Education Details  HEP    Person(s) Educated  Patient    Methods  Explanation;Demonstration;Handout    Comprehension  Verbalized understanding;Returned demonstration          PT Long Term Goals - 11/07/18 1024      PT LONG TERM GOAL #1   Title  Ind with advanced HEP to improve function of LUE    Time  6    Period  Weeks    Status  New    Target Date  12/19/18      PT LONG TERM GOAL #2   Title  Increase AROM of Lt shoulder to [facility] to perform ADLS without difficulty    Time  6    Period  Weeks    Status  New      PT LONG TERM GOAL #3   Title  Patient to report no Lt shoulder pain with functional activities using Lt UE    Time  6    Period  Weeks    Status  New      PT LONG TERM GOAL #4   Title  improved left UE to 5/5 to normalize function    Time  6    Period  Weeks    Status  New      PT LONG TERM GOAL #5   Title  Improve FOTO to </= 27% limitation    Time  6    Period  Weeks    Status  New    Target Date  12/19/18      Additional Long Term Goals   Additional Long Term Goals  Yes  Plan - 11/07/18 1016    Clinical Impression Statement  Patient presents s/p Left RCR, biceps tenodesis and SAD on 10/06/18. He is presently weaning out of sling per MD directive. He has no pain except at end range at this time. He reports tightness which is likely post-surgical edema.    Examination-Activity Limitations  Carry;Lift;Reach Overhead    Stability/Clinical Decision Making  Stable/Uncomplicated    Clinical Decision Making  Low    Rehab Potential  Excellent    PT Frequency  3x / week    PT Duration  6 weeks    PT  Treatment/Interventions  Patient/family education;ADLs/Self Care Home Management;Cryotherapy;Electrical Stimulation;Moist Heat;Manual techniques;Neuromuscular re-education;Therapeutic exercise;Iontophoresis 34m/ml Dexamethasone;Vasopneumatic Device    PT Next Visit Plan  Review HEP; ROM per protocol    PT Home Exercise Plan  [email or web address]   Consulted and Agree with Plan of Care  Patient       Patient will benefit from skilled therapeutic intervention in order to improve the following deficits and impairments:  Pain, Impaired UE functional use, Decreased strength, Decreased range of motion, Decreased activity tolerance  Visit Diagnosis: Stiffness of left shoulder, not elsewhere classified - Plan: PT plan of care cert/re-cert  Acute pain of left shoulder - Plan: PT plan of care cert/re-cert  Abnormal posture - Plan: PT plan of care cert/re-cert  Muscle weakness (generalized) - Plan: PT plan of care cert/re-cert   Localized edema - Plan; PT plan of care/re-cert     Problem List Patient Active Problem List   Diagnosis Date Noted  . Numbness and tingling of left side of face 10/17/2018  . Rotator cuff tear, left 09/05/2018  . Acute left-sided low back pain without sciatica 11/24/2016  . Right-sided chest wall pain 07/01/2016  . Eyelid abnormality 04/09/2016  . Hyperlipidemia 01/16/2014  . Transaminitis 01/16/2014  . Prediabetes 01/16/2014  . Annual physical exam 01/15/2014  . Essential hypertension, benign 01/15/2014  . Obesity 01/15/2014  . Meralgia paresthetica of right side 11/27/2013  . Osteoarthritis of left hip 09/18/2013    JMadelyn FlavorsPT 11/07/2018, 10:39 AM  [facility] [address]Phone: [phone]  Fax:  [phone] Name: Jerry TurnageMRN: [id number] of Birth: 124-Feb-1972

## 2018-11-07 NOTE — Patient Instructions (Signed)
Access Code: [email or web address]  URL: [email or web address]  Date: 11/07/2018  Prepared by: Almyra Free Vernessa Likes   Exercises Supine Shoulder Flexion Extension AAROM with Dowel - 10 reps - 3 sets - 3-5 hold - 3x daily - 7x weekly Supine Shoulder External Internal Rotation AAROM with Dowel - 10 reps - 3 sets - 3-5 hold - 3x daily - 7x weekly Supine Shoulder Abduction AAROM with Dowel - 10 reps - 3 sets - 3-5 hold - 3x daily - 7x weekly Standing Shoulder Abduction AAROM with Dowel - 10 reps - 3 sets - 3-5 sec hold - 2x daily - 7x weekly Supine Elbow Extension Stretch in Supination - 3 reps - 1 sets - 30 sec hold - 2x daily - 7x weekly Circular Shoulder Pendulum with Table Support - 10 reps - 2 sets - 1x daily - 7x weekly

## 2018-11-08 ENCOUNTER — Ambulatory Visit (INDEPENDENT_AMBULATORY_CARE_PROVIDER_SITE_OTHER): Payer: BC Managed Care – PPO | Admitting: Sports Medicine

## 2018-11-08 DIAGNOSIS — I10 Essential (primary) hypertension: Secondary | ICD-10-CM | POA: Diagnosis not present

## 2018-11-08 MED ORDER — LISINOPRIL-HYDROCHLOROTHIAZIDE 20-25 MG PO TABS: 1.0000 | ORAL_TABLET | Freq: Every day | ORAL | 3 refills | Status: DC

## 2018-11-08 NOTE — Assessment & Plan Note (Signed)
Blood pressure continues to be elevated, switching to lisinopril/HCTZ maximum dose, nurse visit in 2 weeks. We will add high-dose amlodipine if still elevated.

## 2018-11-08 NOTE — Progress Notes (Signed)
Pt came into the clinic today for BP check. Pt reports taking a whole tab every morning. Reports no negative side effects. Pt's BP uncontrolled today. Per PCP - double Rx. Will send a new Rx to the pharmacy. Pt to follow up in 2 weeks for [place] BP check.

## 2018-11-10 ENCOUNTER — Other Ambulatory Visit: Payer: Self-pay

## 2018-11-10 ENCOUNTER — Ambulatory Visit (INDEPENDENT_AMBULATORY_CARE_PROVIDER_SITE_OTHER): Payer: BC Managed Care – PPO | Admitting: Physical Therapy

## 2018-11-10 DIAGNOSIS — R293 Abnormal posture: Secondary | ICD-10-CM

## 2018-11-10 DIAGNOSIS — R600 Localized edema: Secondary | ICD-10-CM

## 2018-11-10 DIAGNOSIS — M25612 Stiffness of left shoulder, not elsewhere classified: Secondary | ICD-10-CM | POA: Diagnosis not present

## 2018-11-10 DIAGNOSIS — M25512 Pain in left shoulder: Secondary | ICD-10-CM | POA: Diagnosis not present

## 2018-11-10 DIAGNOSIS — M6281 Muscle weakness (generalized): Secondary | ICD-10-CM | POA: Diagnosis not present

## 2018-11-10 NOTE — Therapy (Addendum)
[place], [place], [address] Phone: [phone]   Fax:  [phone]  Physical Therapy Treatment  Patient Details  Name: Jerry Escobar MRN: [id number] Date of Birth: Oct 28, 1970 Referring Provider (PT): Dr. Griffin Basil   Encounter Date: 11/10/2018  PT End of Session - 11/10/18 0944    Visit Number  2    Number of Visits  12    Date for PT Re-Evaluation  12/19/18    PT Start Time  0936    PT Stop Time  1016    PT Time Calculation (min)  40 min       Past Medical History:  Diagnosis Date  . Hyperlipidemia   . Hypertension   . Seasonal allergies   . Transaminitis     Past Surgical History:  Procedure Laterality Date  . BICEPT TENODESIS Left 10/06/2018   Procedure: BICEPS TENODESIS;  Surgeon: Hiram Gash, MD;  Location: [facility];  Service: Orthopedics;  Laterality: Left;  . HERNIA REPAIR    . KNEE ARTHROSCOPY    . ROTATOR CUFF REPAIR    . SHOULDER ARTHROSCOPY WITH ROTATOR CUFF REPAIR AND SUBACROMIAL DECOMPRESSION Left 10/06/2018   Procedure: LEFT SHOULDER ARTHROSCOPY WITH ROTATOR CUFF REPAIR AND SUBACROMIAL DECOMPRESSION, EXTENSIVE DEBRIDEMENT, BICEP TENODESIS;  Surgeon: Hiram Gash, MD;  Location: [facility];  Service: Orthopedics;  Laterality: Left;  BLOCK    There were no vitals filed for this visit.  Subjective Assessment - 11/10/18 0945    Subjective  Pt reports he is sore from the exercises.  Otherwise, no new changes.    Currently in Pain?  Yes    Pain Score  5     Pain Location  Shoulder    Pain Orientation  Left    Pain Descriptors / Indicators  Sore    Aggravating Factors   moving arm    Pain Relieving Factors  ice         [facility] PT Assessment - 11/10/18 0001      Assessment   Medical Diagnosis  Lt RCR, biceps tenodesis, SAD    Referring Provider (PT)  Dr. Griffin Basil    Onset Date/Surgical Date  10/06/18    Hand Dominance  Right    Next MD  Visit  11/17/18    Prior Therapy  for right shoulder       [facility] Adult PT Treatment/Exercise - 11/10/18 0001      Self-Care   Self-Care  Other Self-Care Comments    Other Self-Care Comments   educated regarding rehab protocol, TENS unit info and parameters      Shoulder Exercises: Seated   Other Seated Exercises  scap retraction x 5 sec x 10 reps (arm in neutral); shoulder rolls x 5 reps forward/ backward     Other Seated Exercises  Lt wrist flex/ ext stretch with elbow bent, arm neutral x 15 sec x 2 reps each direction;  Lt wrist AROM in flex/ext, supination/pronation, radial/ ulnar deviation.      Shoulder Exercises: Standing   Other Standing Exercises  pendulum LUE x 10 CW/CCW      Electrical Stimulation   Electrical Stimulation Location  Lt shoulder    Electrical Stimulation Action  IFC    Electrical Stimulation Parameters  to tolerance    Electrical Stimulation Goals  Pain      Vasopneumatic   Number Minutes Vasopneumatic   10 minutes    Vasopnuematic Location   Shoulder  Vasopneumatic Pressure  Low    Vasopneumatic Temperature   34 deg      Manual Therapy   Manual Therapy  Passive ROM    Passive ROM  Lt shoulder flexion, scaption, ER, ext, to tissue tolerance and no pain, within protocol limits.              PT Education - 11/10/18 1032    Education Details  HEP - modified to reflect Phase 1 through 10/29.    Person(s) Educated  Patient    Methods  Handout;Verbal cues;Demonstration;Explanation    Comprehension  Verbalized understanding;Returned demonstration          PT Long Term Goals - 11/07/18 1024      PT LONG TERM GOAL #1   Title  Ind with advanced HEP to improve function of LUE    Time  6    Period  Weeks    Status  New    Target Date  12/19/18      PT LONG TERM GOAL #2   Title  Increase AROM of Lt shoulder to [facility] to perform ADLS without difficulty    Time  6    Period  Weeks    Status  New      PT LONG TERM GOAL #3   Title  Patient to  report no Lt shoulder pain with functional activities using Lt UE    Time  6    Period  Weeks    Status  New      PT LONG TERM GOAL #4   Title  improved left UE to 5/5 to normalize function    Time  6    Period  Weeks    Status  New      PT LONG TERM GOAL #5   Title  Improve FOTO to </= 27% limitation    Time  6    Period  Weeks    Status  New    Target Date  12/19/18      Additional Long Term Goals   Additional Long Term Goals  Yes            Plan - 11/10/18 1009    Clinical Impression Statement  Pt slightly guarded with PROM; range kept within protocol limits and no pain.  HEP edited to maintain precautions per protocol.  Pt reported reduction of pain at end of session after estim / vaso.  Progressing towards goals.    Examination-Activity Limitations  Carry;Lift;Reach Overhead    Stability/Clinical Decision Making  Stable/Uncomplicated    Rehab Potential  Excellent    PT Frequency  3x / week    PT Duration  6 weeks    PT Treatment/Interventions  Patient/family education;ADLs/Self Care Home Management;Cryotherapy;Electrical Stimulation;Moist Heat;Manual techniques;Neuromuscular re-education;Therapeutic exercise;Iontophoresis 64m/ml Dexamethasone;Vasopneumatic Device    PT Next Visit Plan  Review HEP; ROM per protocol    PT Home Exercise Plan  [id number]   Consulted and Agree with Plan of Care  Patient       Patient will benefit from skilled therapeutic intervention in order to improve the following deficits and impairments:  Pain, Impaired UE functional use, Decreased strength, Decreased range of motion, Decreased activity tolerance  Visit Diagnosis: Stiffness of left shoulder, not elsewhere classified  Acute pain of left shoulder  Abnormal posture  Muscle weakness (generalized)  Localized edema   Problem List Patient Active Problem List   Diagnosis Date Noted  . Numbness and tingling of left side of face 10/17/2018  .  Rotator cuff tear, left 09/05/2018   . Acute left-sided low back pain without sciatica 11/24/2016  . Right-sided chest wall pain 07/01/2016  . Eyelid abnormality 04/09/2016  . Hyperlipidemia 01/16/2014  . Transaminitis 01/16/2014  . Prediabetes 01/16/2014  . Annual physical exam 01/15/2014  . Essential hypertension, benign 01/15/2014  . Obesity 01/15/2014  . Meralgia paresthetica of right side 11/27/2013  . Osteoarthritis of left hip 09/18/2013   Kerin Perna, PTA 11/10/18 10:33 AM  [facility] [place], [place], [address] Phone: [phone]   Fax:  [phone]  Name: Trenten Watchman MRN: [id number] Date of Birth: [birth year]

## 2018-11-10 NOTE — Patient Instructions (Signed)
Access Code: [email or web address]  URL: [email or web address]  Date: 11/10/2018  Prepared by: Kerin Perna   Exercises   Seated Scapular Retraction - 10 reps - 1 sets - 5 hold - 2x daily - 7x weekly  Wrist AROM, in all directions Ball squeeze - as often as you'd like  TENS 7000 - 2nd edition, can be found on [email or web address]

## 2018-11-14 ENCOUNTER — Encounter: Payer: BC Managed Care – PPO | Admitting: Physical Therapy

## 2018-11-16 ENCOUNTER — Encounter: Payer: Self-pay | Admitting: Physical Therapy

## 2018-11-16 ENCOUNTER — Ambulatory Visit (INDEPENDENT_AMBULATORY_CARE_PROVIDER_SITE_OTHER): Payer: BC Managed Care – PPO | Admitting: Physical Therapy

## 2018-11-16 ENCOUNTER — Other Ambulatory Visit: Payer: Self-pay

## 2018-11-16 DIAGNOSIS — M25512 Pain in left shoulder: Secondary | ICD-10-CM | POA: Diagnosis not present

## 2018-11-16 DIAGNOSIS — R293 Abnormal posture: Secondary | ICD-10-CM | POA: Diagnosis not present

## 2018-11-16 DIAGNOSIS — M6281 Muscle weakness (generalized): Secondary | ICD-10-CM

## 2018-11-16 DIAGNOSIS — M25612 Stiffness of left shoulder, not elsewhere classified: Secondary | ICD-10-CM | POA: Diagnosis not present

## 2018-11-16 DIAGNOSIS — R600 Localized edema: Secondary | ICD-10-CM

## 2018-11-16 NOTE — Therapy (Addendum)
[place], [place], [address] Phone: [phone]   Fax:  [phone]  Physical Therapy Treatment  Patient Details  Name: Jerry Escobar MRN: [id number] Date of Birth: 04/05/1970 Referring Provider (PT): Dr. Griffin Basil   Encounter Date: 11/16/2018  PT End of Session - 11/16/18 0801    Visit Number  3    Number of Visits  12    Date for PT Re-Evaluation  12/19/18    PT Start Time  0802    PT Stop Time  0850    PT Time Calculation (min)  48 min       Past Medical History:  Diagnosis Date  . Hyperlipidemia   . Hypertension   . Seasonal allergies   . Transaminitis     Past Surgical History:  Procedure Laterality Date  . BICEPT TENODESIS Left 10/06/2018   Procedure: BICEPS TENODESIS;  Surgeon: Hiram Gash, MD;  Location: [facility];  Service: Orthopedics;  Laterality: Left;  . HERNIA REPAIR    . KNEE ARTHROSCOPY    . ROTATOR CUFF REPAIR    . SHOULDER ARTHROSCOPY WITH ROTATOR CUFF REPAIR AND SUBACROMIAL DECOMPRESSION Left 10/06/2018   Procedure: LEFT SHOULDER ARTHROSCOPY WITH ROTATOR CUFF REPAIR AND SUBACROMIAL DECOMPRESSION, EXTENSIVE DEBRIDEMENT, BICEP TENODESIS;  Surgeon: Hiram Gash, MD;  Location: [facility];  Service: Orthopedics;  Laterality: Left;  BLOCK    There were no vitals filed for this visit.  Subjective Assessment - 11/16/18 0803    Subjective  Everything has been the same and has felt better since limiting exercise.    Currently in Pain?  No/denies         [facility] PT Assessment - 11/16/18 0001      Assessment   Medical Diagnosis  Lt RCR, biceps tenodesis, SAD    Referring Provider (PT)  Dr. Griffin Basil    Onset Date/Surgical Date  10/06/18    Hand Dominance  Right    Next MD Visit  11/18/18    Prior Therapy  for right shoulder      ROM / Strength   AROM / PROM / Strength  Strength      PROM   Right/Left Shoulder  Left    Left Shoulder  Flexion  120 Degrees    Left Shoulder ABduction  115 Degrees   Scaption   Left Shoulder External Rotation  50 Degrees   arm at side     Strength   Strength Assessment Site  Hand    Right/Left hand  Right;Left    Right Hand Grip (lbs)  113    Left Hand Grip (lbs)  83       [facility] Adult PT Treatment/Exercise - 11/16/18 0001      Exercises   Exercises  Hand      Shoulder Exercises: Standing   Retraction  AROM;Both;10 reps   5 sec hold, with pool noodle   Other Standing Exercises  pendulum LUE x 10 horizontal/verticall      Hand Exercises   Other Hand Exercises  hand master - medium x 20; big clam x 10, small clamp x 10       Modalities   Modalities  Vasopneumatic      Vasopneumatic   Number Minutes Vasopneumatic   10 minutes    Vasopnuematic Location   Shoulder   Lt   Vasopneumatic Pressure  Low    Vasopneumatic Temperature   34 deg  Manual Therapy   Manual Therapy  Passive ROM    Manual therapy comments  pt supine     Passive ROM  Lt shoulder flexion, scaption, ER, ext, to tissue tolerance and no pain, within protocol limits.                   PT Long Term Goals - 11/07/18 1024      PT LONG TERM GOAL #1   Title  Ind with advanced HEP to improve function of LUE    Time  6    Period  Weeks    Status  New    Target Date  12/19/18      PT LONG TERM GOAL #2   Title  Increase AROM of Lt shoulder to [facility] to perform ADLS without difficulty    Time  6    Period  Weeks    Status  New      PT LONG TERM GOAL #3   Title  Patient to report no Lt shoulder pain with functional activities using Lt UE    Time  6    Period  Weeks    Status  New      PT LONG TERM GOAL #4   Title  improved left UE to 5/5 to normalize function    Time  6    Period  Weeks    Status  New      PT LONG TERM GOAL #5   Title  Improve FOTO to </= 27% limitation    Time  6    Period  Weeks    Status  New    Target Date  12/19/18      Additional Long Term Goals   Additional  Long Term Goals  Yes            Plan - 11/16/18 0855    Clinical Impression Statement Pt now 6 wks s/p RTC repair.  Continues to be slightly guarded with PROM; range kept within tissue limits and no pain. PROM of Lt shoulder improving each visit.  Grip strength of Lt limited to 83lbs, compared to Rt 113lb.  Will continue to benefit from PT intervention to improve functional mobility.    Examination-Activity Limitations  Carry;Lift;Reach Overhead    Stability/Clinical Decision Making  Stable/Uncomplicated    Rehab Potential  Excellent    PT Frequency  3x / week    PT Duration  6 weeks    PT Treatment/Interventions  Patient/family education;ADLs/Self Care Home Management;Cryotherapy;Electrical Stimulation;Moist Heat;Manual techniques;Neuromuscular re-education;Therapeutic exercise;Iontophoresis 34m/ml Dexamethasone;Vasopneumatic Device    PT Next Visit Plan AAROM exercises with cane, pulleys per protocol. Hold rotator cuff isometrics until 8 wks per protocol.    PT Home Exercise Plan  [id number]   Consulted and Agree with Plan of Care  Patient       Patient will benefit from skilled therapeutic intervention in order to improve the following deficits and impairments:  Pain, Impaired UE functional use, Decreased strength, Decreased range of motion, Decreased activity tolerance  Visit Diagnosis: Stiffness of left shoulder, not elsewhere classified  Acute pain of left shoulder  Abnormal posture  Muscle weakness (generalized)  Localized edema    Problem List Patient Active Problem List   Diagnosis Date Noted  . Numbness and tingling of left side of face 10/17/2018  . Rotator cuff tear, left 09/05/2018  . Acute left-sided low back pain without sciatica 11/24/2016  . Right-sided chest wall pain 07/01/2016  . Eyelid abnormality 04/09/2016  .  Hyperlipidemia 01/16/2014  . Transaminitis 01/16/2014  . Prediabetes 01/16/2014  . Annual physical exam 01/15/2014  . Essential  hypertension, benign 01/15/2014  . Obesity 01/15/2014  . Meralgia paresthetica of right side 11/27/2013  . Osteoarthritis of left hip 09/18/2013    Gardiner Rhyme, SPTA 11/16/2018, 9:12 AM   Kerin Perna, PTA 11/16/18 9:41 AM   [facility] [place], [place], [address] Phone: [phone]   Fax:  [phone]  Name: Jerry Escobar MRN: [id number] Date of Birth: 1971/01/07

## 2018-11-18 DIAGNOSIS — M24112 Other articular cartilage disorders, left shoulder: Secondary | ICD-10-CM | POA: Diagnosis not present

## 2018-11-18 DIAGNOSIS — M25512 Pain in left shoulder: Secondary | ICD-10-CM | POA: Diagnosis not present

## 2018-11-21 ENCOUNTER — Encounter: Payer: Self-pay | Admitting: Physical Therapy

## 2018-11-21 ENCOUNTER — Ambulatory Visit (INDEPENDENT_AMBULATORY_CARE_PROVIDER_SITE_OTHER): Payer: BC Managed Care – PPO | Admitting: Physical Therapy

## 2018-11-21 ENCOUNTER — Other Ambulatory Visit: Payer: Self-pay

## 2018-11-21 DIAGNOSIS — M25612 Stiffness of left shoulder, not elsewhere classified: Secondary | ICD-10-CM

## 2018-11-21 DIAGNOSIS — R293 Abnormal posture: Secondary | ICD-10-CM | POA: Diagnosis not present

## 2018-11-21 DIAGNOSIS — R600 Localized edema: Secondary | ICD-10-CM

## 2018-11-21 DIAGNOSIS — M6281 Muscle weakness (generalized): Secondary | ICD-10-CM

## 2018-11-21 DIAGNOSIS — R29898 Other symptoms and signs involving the musculoskeletal system: Secondary | ICD-10-CM

## 2018-11-21 DIAGNOSIS — M25512 Pain in left shoulder: Secondary | ICD-10-CM

## 2018-11-21 NOTE — Therapy (Addendum)
[place], [place], [address] Phone: [phone]   Fax:  [phone]  Physical Therapy Treatment  Patient Details  Name: Jerry Escobar MRN: [id number] Date of Birth: [birth year] Referring Provider (PT): Dr. Griffin Basil   Encounter Date: 11/21/2018  PT End of Session - 11/21/18 1023    Visit Number  4    Number of Visits  12    Date for PT Re-Evaluation  12/19/18    PT Start Time  1020    PT Stop Time  1106    PT Time Calculation (min)  46 min    Activity Tolerance  Patient tolerated treatment well    Behavior During Therapy  [facility] for tasks assessed/performed       Past Medical History:  Diagnosis Date  . Hyperlipidemia   . Hypertension   . Seasonal allergies   . Transaminitis     Past Surgical History:  Procedure Laterality Date  . BICEPT TENODESIS Left 10/06/2018   Procedure: BICEPS TENODESIS;  Surgeon: Hiram Gash, MD;  Location: [facility];  Service: Orthopedics;  Laterality: Left;  . HERNIA REPAIR    . KNEE ARTHROSCOPY    . ROTATOR CUFF REPAIR    . SHOULDER ARTHROSCOPY WITH ROTATOR CUFF REPAIR AND SUBACROMIAL DECOMPRESSION Left 10/06/2018   Procedure: LEFT SHOULDER ARTHROSCOPY WITH ROTATOR CUFF REPAIR AND SUBACROMIAL DECOMPRESSION, EXTENSIVE DEBRIDEMENT, BICEP TENODESIS;  Surgeon: Hiram Gash, MD;  Location: [facility];  Service: Orthopedics;  Laterality: Left;  BLOCK    There were no vitals filed for this visit.  Subjective Assessment - 11/21/18 1023    Subjective  going well, no pain reported    Pertinent History  L RCR 10/06/18; Rt shoulder RCR ~ 6 yrs ago; Rt knee surgery - scope x 2 most recent ~ 12 yrs ago; HTN    Patient Stated Goals  get motion and strength back    Currently in Pain?  No/denies                       [facility] Adult PT Treatment/Exercise - 11/21/18 0001      Exercises   Exercises  Shoulder      Shoulder Exercises:  Supine   External Rotation  AAROM;Left;20 reps;Limitations    External Rotation Limitations  cane within protocol    Internal Rotation  AAROM;20 reps    Flexion  AAROM;20 reps;Limitations    Flexion Limitations  cane within protocol    ABduction  AAROM;20 reps    ABduction Limitations  within protocol with PT support of arm off plinth    Other Supine Exercises  scapular retraction with towel roll; isometric adduction 5 sec hold x 10    Other Supine Exercises  gentle rhytmic stab with arm at 90 flexion in 4 directions      Shoulder Exercises: Seated   External Rotation  --    External Rotation Limitations  --    Flexion  --    Flexion Limitations  --    Abduction  --    ABduction Limitations  --      Vasopneumatic   Number Minutes Vasopneumatic   10 minutes    Vasopnuematic Location   Shoulder    Vasopneumatic Pressure  Low    Vasopneumatic Temperature   34 deg             PT Education - 11/21/18 1257    Education Details  ER modified to keep elbow at side    Person(s) Educated  Patient    Methods  Explanation;Demonstration;Handout    Comprehension  Verbalized understanding;Returned demonstration          PT Long Term Goals - 11/07/18 1024      PT LONG TERM GOAL #1   Title  Ind with advanced HEP to improve function of LUE    Time  6    Period  Weeks    Status  New    Target Date  12/19/18      PT LONG TERM GOAL #2   Title  Increase AROM of Lt shoulder to [facility] to perform ADLS without difficulty    Time  6    Period  Weeks    Status  New      PT LONG TERM GOAL #3   Title  Patient to report no Lt shoulder pain with functional activities using Lt UE    Time  6    Period  Weeks    Status  New      PT LONG TERM GOAL #4   Title  improved left UE to 5/5 to normalize function    Time  6    Period  Weeks    Status  New      PT LONG TERM GOAL #5   Title  Improve FOTO to </= 27% limitation    Time  6    Period  Weeks    Status  New    Target Date   12/19/18      Additional Long Term Goals   Additional Long Term Goals  Yes            Plan - 11/21/18 1304    Clinical Impression Statement  Patient tolerated supine TE well today and cane AAROM with no complaints of pain just tightness.    PT Treatment/Interventions  Patient/family education;ADLs/Self Care Home Management;Cryotherapy;Electrical Stimulation;Moist Heat;Manual techniques;Neuromuscular re-education;Therapeutic exercise;Iontophoresis 71m/ml Dexamethasone;Vasopneumatic Device    PT Next Visit Plan  Initiate pulleys per protocol    PT Home Exercise Plan  [email or web address]      Patient will benefit from skilled therapeutic intervention in order to improve the following deficits and impairments:  Pain, Impaired UE functional use, Decreased strength, Decreased range of motion, Decreased activity tolerance  Visit Diagnosis: Stiffness of left shoulder, not elsewhere classified  Acute pain of left shoulder  Abnormal posture  Muscle weakness (generalized)  Other symptoms and signs involving the musculoskeletal system  Localized edema     Problem List Patient Active Problem List   Diagnosis Date Noted  . Numbness and tingling of left side of face 10/17/2018  . Rotator cuff tear, left 09/05/2018  . Acute left-sided low back pain without sciatica 11/24/2016  . Right-sided chest wall pain 07/01/2016  . Eyelid abnormality 04/09/2016  . Hyperlipidemia 01/16/2014  . Transaminitis 01/16/2014  . Prediabetes 01/16/2014  . Annual physical exam 01/15/2014  . Essential hypertension, benign 01/15/2014  . Obesity 01/15/2014  . Meralgia paresthetica of right side 11/27/2013  . Osteoarthritis of left hip 09/18/2013   JMadelyn FlavorsPT 11/21/2018, 1:08 PM  [facility] [address]Phone: [phone]  Fax:  [phone] Name: DSadat SliwaMRN: [id number] of Birth: [birth year]

## 2018-11-22 ENCOUNTER — Ambulatory Visit (INDEPENDENT_AMBULATORY_CARE_PROVIDER_SITE_OTHER): Payer: BC Managed Care – PPO | Admitting: Sports Medicine

## 2018-11-22 DIAGNOSIS — I10 Essential (primary) hypertension: Secondary | ICD-10-CM

## 2018-11-22 MED ORDER — AMLODIPINE BESYLATE 5 MG PO TABS: 5.0000 mg | ORAL_TABLET | Freq: Every day | ORAL | 3 refills | Status: DC

## 2018-11-22 NOTE — Progress Notes (Signed)
Established Patient Office Visit  Subjective:  Patient ID: Jerry Escobar, male    DOB: 01-21-1970  Age: 48 y.o. MRN: [id number]  CC:  Chief Complaint  Patient presents with  . Hypertension    HPI Jerry Escobar presents for blood pressure check. He is taking medications as directed without any problems. Denies chest pain, shortness of breath or dizziness.    Past Medical History:  Diagnosis Date  . Hyperlipidemia   . Hypertension   . Seasonal allergies   . Transaminitis     Past Surgical History:  Procedure Laterality Date  . BICEPT TENODESIS Left 10/06/2018   Procedure: BICEPS TENODESIS;  Surgeon: Hiram Gash, MD;  Location: [facility];  Service: Orthopedics;  Laterality: Left;  . HERNIA REPAIR    . KNEE ARTHROSCOPY    . ROTATOR CUFF REPAIR    . SHOULDER ARTHROSCOPY WITH ROTATOR CUFF REPAIR AND SUBACROMIAL DECOMPRESSION Left 10/06/2018   Procedure: LEFT SHOULDER ARTHROSCOPY WITH ROTATOR CUFF REPAIR AND SUBACROMIAL DECOMPRESSION, EXTENSIVE DEBRIDEMENT, BICEP TENODESIS;  Surgeon: Hiram Gash, MD;  Location: [facility];  Service: Orthopedics;  Laterality: Left;  BLOCK    Family History  Problem Relation Age of Onset  . Cancer Father        prostate  . Breast cancer Paternal Grandmother     Social History   Socioeconomic History  . Marital status: Single    Spouse name: Not on file  . Number of children: 3  . Years of education: Not on file  . Highest education level: Not on file  Occupational History  . Not on file  Social Needs  . Financial resource strain: Not on file  . Food insecurity    Worry: Not on file    Inability: Not on file  . Transportation needs    Medical: Not on file    Non-medical: Not on file  Tobacco Use  . Smoking status: Never Smoker  . Smokeless tobacco: Never Used  Substance and Sexual Activity  . Alcohol use: Yes    Alcohol/week: 3.0 standard drinks    Types: 3 Standard drinks or equivalent per  week    Comment: Occasional  . Drug use: No  . Sexual activity: Not on file  Lifestyle  . Physical activity    Days per week: Not on file    Minutes per session: Not on file  . Stress: Not on file  Relationships  . Social Herbalist on phone: Not on file    Gets together: Not on file    Attends religious service: Not on file    Active member of club or organization: Not on file    Attends meetings of clubs or organizations: Not on file    Relationship status: Not on file  . Intimate partner violence    Fear of current or ex partner: Not on file    Emotionally abused: Not on file    Physically abused: Not on file    Forced sexual activity: Not on file  Other Topics Concern  . Not on file  Social History Narrative  . Not on file    Outpatient Medications Prior to Visit  Medication Sig Dispense Refill  . Ascorbic Acid (VITAMIN C) 1000 MG tablet Take 1,000 mg by mouth daily.    Marland Kitchen lisinopril-hydrochlorothiazide (ZESTORETIC) 20-25 MG tablet Take 1 tablet by mouth daily. 30 tablet 3  . meloxicam (MOBIC) 7.5 MG tablet Take 1 tablet (7.5 mg  total) by mouth daily. 30 tablet 2  . Multiple Vitamins-Minerals (MULTIVITAMIN ADULT PO) Take 1 capsule by mouth daily.    . Omega-3 Fatty Acids (FISH OIL PO) Take 1 capsule by mouth daily.    . Vitamin D, Ergocalciferol, (DRISDOL) 1.25 MG (50000 UT) CAPS capsule Take 1 capsule (50,000 Units total) by mouth every 7 (seven) days. Take for 8 total doses(weeks) 8 capsule 0   No facility-administered medications prior to visit.     No Known Allergies  ROS Review of Systems    Objective:    Physical Exam  BP (!) 145/90   Pulse 89   SpO2 96%  Wt Readings from Last 3 Encounters:  10/24/18 281 lb (127.5 kg)  10/17/18 282 lb (127.9 kg)  10/06/18 281 lb 4.9 oz (127.6 kg)     There are no preventive care reminders to display for this patient.  There are no preventive care reminders to display for this patient.  Lab Results   Component Value Date   TSH 0.93 10/17/2018   Lab Results  Component Value Date   WBC 5.8 10/17/2018   HGB 14.6 10/17/2018   HCT 43.6 10/17/2018   MCV 82.6 10/17/2018   PLT 222 10/17/2018   Lab Results  Component Value Date   NA 140 10/17/2018   K 4.1 10/17/2018   CO2 26 10/17/2018   GLUCOSE 95 10/17/2018   BUN 20 10/17/2018   CREATININE 1.19 10/17/2018   BILITOT 0.6 10/17/2018   ALKPHOS 55 04/09/2016   AST 24 10/17/2018   ALT 27 10/17/2018   PROT 7.4 10/17/2018   ALBUMIN 4.5 04/09/2016   CALCIUM 9.3 10/17/2018   Lab Results  Component Value Date   CHOL 198 10/17/2018   Lab Results  Component Value Date   HDL 48 10/17/2018   Lab Results  Component Value Date   LDLCALC 125 (H) 10/17/2018   Lab Results  Component Value Date   TRIG 134 10/17/2018   Lab Results  Component Value Date   CHOLHDL 4.1 10/17/2018   Lab Results  Component Value Date   HGBA1C 5.6 10/17/2018      Assessment & Plan:  HTN - Patient advised, per Dr Dianah Field, to take Amlodipine 5 mg once daily and continue taking the Lisinopril-HCTZ 20-25 mg once daily. Follow up in 2 weeks for nurse visit blood pressure check.     Problem List Items Addressed This Visit    Essential hypertension, benign    Blood pressure continues to be elevated on lisinopril/HCTZ maximum dose. Adding amlodipine 5 mg daily, return for another nurse visit blood pressure check in 2 weeks.      Relevant Medications   amLODipine (NORVASC) 5 MG tablet      Meds ordered this encounter  Medications  . amLODipine (NORVASC) 5 MG tablet    Sig: Take 1 tablet (5 mg total) by mouth daily.    Dispense:  30 tablet    Refill:  3    Follow-up: Return in about 2 weeks (around 12/06/2018) for nurse visit blood pressure check. Durene Romans, Monico Blitz, [place]

## 2018-11-22 NOTE — Patient Instructions (Addendum)
Take Amlodipine 5 mg once daily and continue taking the Lisinopril-HCTZ 20-25 mg once daily. Follow up in 2 weeks for nurse visit blood pressure check.

## 2018-11-22 NOTE — Assessment & Plan Note (Signed)
Blood pressure continues to be elevated on lisinopril/HCTZ maximum dose. Adding amlodipine 5 mg daily, return for another nurse visit blood pressure check in 2 weeks.

## 2018-11-25 ENCOUNTER — Encounter: Payer: Self-pay | Admitting: Physical Therapy

## 2018-11-25 ENCOUNTER — Ambulatory Visit (INDEPENDENT_AMBULATORY_CARE_PROVIDER_SITE_OTHER): Payer: BC Managed Care – PPO | Admitting: Physical Therapy

## 2018-11-25 ENCOUNTER — Other Ambulatory Visit: Payer: Self-pay

## 2018-11-25 DIAGNOSIS — M25612 Stiffness of left shoulder, not elsewhere classified: Secondary | ICD-10-CM

## 2018-11-25 DIAGNOSIS — M25512 Pain in left shoulder: Secondary | ICD-10-CM | POA: Diagnosis not present

## 2018-11-25 DIAGNOSIS — R293 Abnormal posture: Secondary | ICD-10-CM | POA: Diagnosis not present

## 2018-11-25 DIAGNOSIS — R600 Localized edema: Secondary | ICD-10-CM

## 2018-11-25 NOTE — Therapy (Addendum)
[place], [place], [address] Phone: [phone]   Fax:  [phone]  Physical Therapy Treatment  Patient Details  Name: Jerry Escobar MRN: [id number] Date of Birth: 1970/04/04 Referring Provider (PT): Dr. Griffin Basil   Encounter Date: 11/25/2018  PT End of Session - 11/25/18 1019    Visit Number  5    Number of Visits  12    Date for PT Re-Evaluation  12/19/18    PT Start Time  1019    PT Stop Time  1102    PT Time Calculation (min)  43 min    Activity Tolerance  Patient tolerated treatment well    Behavior During Therapy  [facility] for tasks assessed/performed       Past Medical History:  Diagnosis Date  . Hyperlipidemia   . Hypertension   . Seasonal allergies   . Transaminitis     Past Surgical History:  Procedure Laterality Date  . BICEPT TENODESIS Left 10/06/2018   Procedure: BICEPS TENODESIS;  Surgeon: Hiram Gash, MD;  Location: [place];  Service: Orthopedics;  Laterality: Left;  . HERNIA REPAIR    . KNEE ARTHROSCOPY    . ROTATOR CUFF REPAIR    . SHOULDER ARTHROSCOPY WITH ROTATOR CUFF REPAIR AND SUBACROMIAL DECOMPRESSION Left 10/06/2018   Procedure: LEFT SHOULDER ARTHROSCOPY WITH ROTATOR CUFF REPAIR AND SUBACROMIAL DECOMPRESSION, EXTENSIVE DEBRIDEMENT, BICEP TENODESIS;  Surgeon: Hiram Gash, MD;  Location: [place];  Service: Orthopedics;  Laterality: Left;  BLOCK    There were no vitals filed for this visit.  Subjective Assessment - 11/25/18 1019    Subjective  He states that his Lt shoulder is doing well. His doctor said he was "on track" at last appt.   Currently in Pain?  No/denies         [facility] PT Assessment - 11/25/18 0001      Assessment   Medical Diagnosis  Lt RCR, biceps tenodesis, SAD    Referring Provider (PT)  Dr. Griffin Basil    Onset Date/Surgical Date  10/06/18    Hand Dominance  Right    Next MD Visit  12/30/18    Prior Therapy  for  right shoulder      PROM   Right/Left Shoulder  Left    Left Shoulder Flexion  135 Degrees   AAROM with cane   Left Shoulder ABduction  131 Degrees   scaption AAROM with cane   Left Shoulder External Rotation  51 Degrees   AAROM with cane      [facility] Adult PT Treatment/Exercise - 11/25/18 0001      Shoulder Exercises: Supine   External Rotation  AAROM;Left;12 reps   with cane; 3 sec hold   Flexion  AAROM;Both;12 reps   with cane; 3 sec hold   ABduction  AAROM;Left;12 reps   scaption; with cane; 3 sec hold     Shoulder Exercises: Standing   Internal Rotation  AAROM;Left;12 reps   with cane; 3 sec hold   Extension  AAROM;Both;12 reps   with cane; 3 sec hold      Shoulder Exercises: Pulleys   Flexion  --   5 reps; 10 sec hold   Scaption  --   5 reps; 10 sec hold     Vasopneumatic   Number Minutes Vasopneumatic   10 minutes    Vasopnuematic Location   Shoulder    Vasopneumatic Pressure  Low    Vasopneumatic Temperature  34 deg      Manual Therapy   Manual Therapy  Soft tissue mobilization;Scapular mobilization    Manual therapy comments  STM to Lt upper trap, rhomboids , levator scap    Scapular Mobilization  Lt scapula medial/lateral         PT Long Term Goals - 11/07/18 1024      PT LONG TERM GOAL #1   Title  Ind with advanced HEP to improve function of LUE    Time  6    Period  Weeks    Status  New    Target Date  12/19/18      PT LONG TERM GOAL #2   Title  Increase AROM of Lt shoulder to [facility] to perform ADLS without difficulty    Time  6    Period  Weeks    Status  New      PT LONG TERM GOAL #3   Title  Patient to report no Lt shoulder pain with functional activities using Lt UE    Time  6    Period  Weeks    Status  New      PT LONG TERM GOAL #4   Title  improved left UE to 5/5 to normalize function    Time  6    Period  Weeks    Status  New      PT LONG TERM GOAL #5   Title  Improve FOTO to </= 27% limitation    Time  6    Period  Weeks     Status  New    Target Date  12/19/18      Additional Long Term Goals   Additional Long Term Goals  Yes            Plan - 11/25/18 1209    Clinical Impression Statement  Pt tolerated AAROM exercises well today without complaints of pain. His Rt shoulder ROM improving each visit.  Pt required intermittent cues for technique. Continues to make good progress toward all goals; will continue to benefit from skilled PT to improve funcitonal mobility.    Rehab Potential  Excellent    PT Frequency  3x / week    PT Duration  6 weeks    PT Treatment/Interventions  Patient/family education;ADLs/Self Care Home Management;Cryotherapy;Electrical Stimulation;Moist Heat;Manual techniques;Neuromuscular re-education;Therapeutic exercise;Iontophoresis 48m/ml Dexamethasone;Vasopneumatic Device    PT Next Visit Plan  Contiue with AAROM exercises per protocol and pt tolerance.  Cuff isometrics at 8wks.       Patient will benefit from skilled therapeutic intervention in order to improve the following deficits and impairments:  Pain, Impaired UE functional use, Decreased strength, Decreased range of motion, Decreased activity tolerance  Visit Diagnosis: Stiffness of left shoulder, not elsewhere classified  Acute pain of left shoulder  Abnormal posture   Localized edema     Problem List Patient Active Problem List   Diagnosis Date Noted  . Numbness and tingling of left side of face 10/17/2018  . Rotator cuff tear, left 09/05/2018  . Acute left-sided low back pain without sciatica 11/24/2016  . Right-sided chest wall pain 07/01/2016  . Eyelid abnormality 04/09/2016  . Hyperlipidemia 01/16/2014  . Transaminitis 01/16/2014  . Prediabetes 01/16/2014  . Annual physical exam 01/15/2014  . Essential hypertension, benign 01/15/2014  . Obesity 01/15/2014  . Meralgia paresthetica of right side 11/27/2013  . Osteoarthritis of left hip 09/18/2013    KGardiner Rhyme SPTA 11/25/2018, 12:15 PM    JKerin Perna  PTA 11/25/18 12:22 PM   [facility] [place], [place], [address] Phone: [phone]   Fax:  [phone]  Name: Arul Farabee MRN: [id number] Date of Birth: November 09, 1970

## 2018-11-27 DIAGNOSIS — Z20828 Contact with and (suspected) exposure to other viral communicable diseases: Secondary | ICD-10-CM | POA: Diagnosis not present

## 2018-11-28 ENCOUNTER — Encounter: Payer: Self-pay | Admitting: Physical Therapy

## 2018-11-28 ENCOUNTER — Ambulatory Visit (INDEPENDENT_AMBULATORY_CARE_PROVIDER_SITE_OTHER): Payer: BC Managed Care – PPO | Admitting: Physical Therapy

## 2018-11-28 ENCOUNTER — Other Ambulatory Visit: Payer: Self-pay

## 2018-11-28 DIAGNOSIS — M25612 Stiffness of left shoulder, not elsewhere classified: Secondary | ICD-10-CM | POA: Diagnosis not present

## 2018-11-28 DIAGNOSIS — M6281 Muscle weakness (generalized): Secondary | ICD-10-CM

## 2018-11-28 DIAGNOSIS — R600 Localized edema: Secondary | ICD-10-CM

## 2018-11-28 DIAGNOSIS — M25512 Pain in left shoulder: Secondary | ICD-10-CM

## 2018-11-28 NOTE — Therapy (Addendum)
[place], [place], [address] Phone: [phone]   Fax:  [phone]  Physical Therapy Treatment  Patient Details  Name: Jerry Escobar MRN: [id number] Date of Birth: [birth year] Referring Provider (PT): Dr. Griffin Basil   Encounter Date: 11/28/2018  PT End of Session - 11/28/18 0938    Visit Number  6    Number of Visits  12    Date for PT Re-Evaluation  12/19/18    PT Start Time  0933    PT Stop Time  1024    PT Time Calculation (min)  51 min    Activity Tolerance  Patient tolerated treatment well    Behavior During Therapy  [facility] for tasks assessed/performed       Past Medical History:  Diagnosis Date  . Hyperlipidemia   . Hypertension   . Seasonal allergies   . Transaminitis     Past Surgical History:  Procedure Laterality Date  . BICEPT TENODESIS Left 10/06/2018   Procedure: BICEPS TENODESIS;  Surgeon: Hiram Gash, MD;  Location: [place];  Service: Orthopedics;  Laterality: Left;  . HERNIA REPAIR    . KNEE ARTHROSCOPY    . ROTATOR CUFF REPAIR    . SHOULDER ARTHROSCOPY WITH ROTATOR CUFF REPAIR AND SUBACROMIAL DECOMPRESSION Left 10/06/2018   Procedure: LEFT SHOULDER ARTHROSCOPY WITH ROTATOR CUFF REPAIR AND SUBACROMIAL DECOMPRESSION, EXTENSIVE DEBRIDEMENT, BICEP TENODESIS;  Surgeon: Hiram Gash, MD;  Location: [place];  Service: Orthopedics;  Laterality: Left;  BLOCK    There were no vitals filed for this visit.  Subjective Assessment - 11/28/18 0938    Subjective  No new complaints with shoulder aside from limited motion.    Pertinent History  L RCR,SAD; 10/06/18; Rt shoulder RCR ~ 6 yrs ago; Rt knee surgery - scope x 2 most recent ~ 12 yrs ago; HTN    Patient Stated Goals  get motion and strength back    Currently in Pain?  No/denies         [facility] PT Assessment - 11/28/18 0001      PROM   PROM Assessment Site  Shoulder    Right/Left Shoulder   Left    Left Shoulder Extension  36 Degrees   AA with cane   Left Shoulder Flexion  145 Degrees    Left Shoulder ABduction  140 Degrees   80 deg with cane supine; 158 in scaption   Left Shoulder External Rotation  55 Degrees                   [facility] Adult PT Treatment/Exercise - 11/28/18 0001      Shoulder Exercises: Supine   External Rotation  AAROM;Left;12 reps   with cane; 3 sec hold   Flexion  AAROM;Both;12 reps   with cane; 3 sec hold     Shoulder Exercises: Seated   External Rotation  AAROM;Left;10 reps      Shoulder Exercises: Standing   Internal Rotation  AAROM;Left;12 reps   with cane; 3 sec hold   ABduction  AAROM;12 reps   with cane 3 sec hold   Extension  AAROM;Both;12 reps   with cane; 3 sec hold      Shoulder Exercises: Pulleys   Flexion  2 minutes    Scaption  2 minutes    Other Pulley Exercises  wall ladder to #32 with 10 sec hold at top x 5      Modalities  Modalities  Vasopneumatic      Vasopneumatic   Number Minutes Vasopneumatic   10 minutes    Vasopnuematic Location   Shoulder    Vasopneumatic Pressure  Low    Vasopneumatic Temperature   34 deg      Manual Therapy   Manual Therapy  Passive ROM    Passive ROM  left shoulder flex/scaption/ABD/ ER                  PT Long Term Goals - 11/07/18 1024      PT LONG TERM GOAL #1   Title  Ind with advanced HEP to improve function of LUE    Time  6    Period  Weeks    Status  New    Target Date  12/19/18      PT LONG TERM GOAL #2   Title  Increase AROM of Lt shoulder to [facility] to perform ADLS without difficulty    Time  6    Period  Weeks    Status  New      PT LONG TERM GOAL #3   Title  Patient to report no Lt shoulder pain with functional activities using Lt UE    Time  6    Period  Weeks    Status  New      PT LONG TERM GOAL #4   Title  improved left UE to 5/5 to normalize function    Time  6    Period  Weeks    Status  New      PT LONG TERM GOAL #5   Title   Improve FOTO to </= 27% limitation    Time  6    Period  Weeks    Status  New    Target Date  12/19/18      Additional Long Term Goals   Additional Long Term Goals  Yes            Plan - 11/28/18 1020    Clinical Impression Statement  Patient continues to progress with ROM and denies pain. Intermittent cues to avoid rotation with ER in standing and UT activation with ABD. Should be able to progress to AROM and isometrics at next appt.    PT Frequency  3x / week    PT Duration  6 weeks    PT Treatment/Interventions  Patient/family education;ADLs/Self Care Home Management;Cryotherapy;Electrical Stimulation;Moist Heat;Manual techniques;Neuromuscular re-education;Therapeutic exercise;Iontophoresis 77m/ml Dexamethasone;Vasopneumatic Device    PT Next Visit Plan  advance exercises per protocol and pt tolerance    PT Home Exercise Plan  [email or web address]   Consulted and Agree with Plan of Care  Patient       Patient will benefit from skilled therapeutic intervention in order to improve the following deficits and impairments:  Pain, Impaired UE functional use, Decreased strength, Decreased range of motion, Decreased activity tolerance  Visit Diagnosis: Stiffness of left shoulder, not elsewhere classified   Left shoulder pain  Generalized weakness  Localized edema     Problem List Patient Active Problem List   Diagnosis Date Noted  . Numbness and tingling of left side of face 10/17/2018  . Rotator cuff tear, left 09/05/2018  . Acute left-sided low back pain without sciatica 11/24/2016  . Right-sided chest wall pain 07/01/2016  . Eyelid abnormality 04/09/2016  . Hyperlipidemia 01/16/2014  . Transaminitis 01/16/2014  . Prediabetes 01/16/2014  . Annual physical exam 01/15/2014  . Essential hypertension, benign 01/15/2014  . Obesity 01/15/2014  .  Meralgia paresthetica of right side 11/27/2013  . Osteoarthritis of left hip 09/18/2013   Madelyn Flavors PT 11/28/2018, 10:28 AM  [facility] [place], [place], [address] Phone: [phone]   Fax:  [phone]  Name: Jerry Escobar MRN: [id number] Date of Birth: 1970-06-25

## 2018-12-02 ENCOUNTER — Other Ambulatory Visit: Payer: Self-pay

## 2018-12-02 ENCOUNTER — Ambulatory Visit (INDEPENDENT_AMBULATORY_CARE_PROVIDER_SITE_OTHER): Payer: BC Managed Care – PPO | Admitting: Physical Therapy

## 2018-12-02 DIAGNOSIS — M25612 Stiffness of left shoulder, not elsewhere classified: Secondary | ICD-10-CM | POA: Diagnosis not present

## 2018-12-02 DIAGNOSIS — M6281 Muscle weakness (generalized): Secondary | ICD-10-CM

## 2018-12-02 DIAGNOSIS — R600 Localized edema: Secondary | ICD-10-CM

## 2018-12-02 DIAGNOSIS — M25512 Pain in left shoulder: Secondary | ICD-10-CM

## 2018-12-02 DIAGNOSIS — R293 Abnormal posture: Secondary | ICD-10-CM

## 2018-12-02 NOTE — Therapy (Signed)
[place], [place], [address] Phone: [phone]   Fax:  [phone]  Physical Therapy Treatment  Patient Details  Name: Jerry Escobar MRN: [id number] Date of Birth: [birth year] Referring Provider (PT): Dr. Griffin Basil   Encounter Date: 12/02/2018  PT End of Session - 12/02/18 0928    Visit Number  7    Number of Visits  12    Date for PT Re-Evaluation  12/19/18    PT Start Time  0929    PT Stop Time  1015    PT Time Calculation (min)  46 min    Activity Tolerance  Patient tolerated treatment well    Behavior During Therapy  [facility] for tasks assessed/performed       Past Medical History:  Diagnosis Date  . Hyperlipidemia   . Hypertension   . Seasonal allergies   . Transaminitis     Past Surgical History:  Procedure Laterality Date  . BICEPT TENODESIS Left 10/06/2018   Procedure: BICEPS TENODESIS;  Surgeon: Hiram Gash, MD;  Location: [place];  Service: Orthopedics;  Laterality: Left;  . HERNIA REPAIR    . KNEE ARTHROSCOPY    . ROTATOR CUFF REPAIR    . SHOULDER ARTHROSCOPY WITH ROTATOR CUFF REPAIR AND SUBACROMIAL DECOMPRESSION Left 10/06/2018   Procedure: LEFT SHOULDER ARTHROSCOPY WITH ROTATOR CUFF REPAIR AND SUBACROMIAL DECOMPRESSION, EXTENSIVE DEBRIDEMENT, BICEP TENODESIS;  Surgeon: Hiram Gash, MD;  Location: [place];  Service: Orthopedics;  Laterality: Left;  BLOCK    There were no vitals filed for this visit.  Subjective Assessment - 12/02/18 0930    Subjective  Pt is pleased with the progress he is making. He states that he finds it easier to raise his arm now. He states he woke up on his Lt shoulder this morning and believes this caused his pain.    Currently in Pain?  Yes    Pain Score  7     Pain Location  Shoulder    Pain Orientation  Left;Lateral;Upper    Pain Descriptors / Indicators  Discomfort;Burning    Pain Type  Surgical pain     Aggravating Factors   moving arm    Pain Relieving Factors  ice         [facility] PT Assessment - 12/02/18 0001      Assessment   Medical Diagnosis  Lt RCR, biceps tenodesis, SAD    Referring Provider (PT)  Dr. Griffin Basil    Onset Date/Surgical Date  10/06/18    Hand Dominance  Right    Next MD Visit  12/30/18    Prior Therapy  for right shoulder      PROM   Right/Left Shoulder  Left    Left Shoulder Flexion  135 Degrees   AAROM with cane; supine   Left Shoulder External Rotation  57 Degrees   AAROM with cane, supine      [facility] Adult PT Treatment/Exercise - 12/02/18 0001      Shoulder Exercises: Supine   Horizontal ABduction/addct  AAROM;Left;10 reps with cane   External Rotation  AAROM;Left;10 reps   5 sec hold   ABduction  AAROM;Left;5 reps   scaption; 5 sec hold   Other Supine Exercises  flexion with cane x 5 reps, 2 sets, 10 sec holds     Shoulder Exercises: Standing   Internal Rotation  AAROM;Left;10 reps    Extension  AAROM;Left;10 reps   5 sec hold  Shoulder Exercises: Pulleys   Flexion  --   10 reps; 10 sec   Scaption  --   10 reps; 10 sec     Shoulder Exercises: Isometric Strengthening   Flexion  5X5"   Lt   Extension  5X5"   Lt   External Rotation  5X5"   Lt   Internal Rotation  5X5"   Lt     Vasopneumatic   Number Minutes Vasopneumatic   10 minutes    Vasopnuematic Location   Shoulder    Vasopneumatic Pressure  Low    Vasopneumatic Temperature   34 deg      Manual Therapy   Manual Therapy  Soft tissue mobilization    Soft tissue mobilization  STM to Lt pec, upper trap, levator scap, rhomboid    Scapular Mobilization  Lt scapula medial/lateral             PT Education - 12/02/18 1046    Education Details  HEP added: shoulder isometrics    Person(s) Educated  Patient    Methods  Explanation;Demonstration;Verbal cues   pt declined handout   Comprehension  Verbalized understanding;Returned demonstration;Verbal cues required           PT Long Term Goals - 11/07/18 1024      PT LONG TERM GOAL #1   Title  Ind with advanced HEP to improve function of LUE    Time  6    Period  Weeks    Status  New    Target Date  12/19/18      PT LONG TERM GOAL #2   Title  Increase AROM of Lt shoulder to [facility] to perform ADLS without difficulty    Time  6    Period  Weeks    Status  New      PT LONG TERM GOAL #3   Title  Patient to report no Lt shoulder pain with functional activities using Lt UE    Time  6    Period  Weeks    Status  New      PT LONG TERM GOAL #4   Title  improved left UE to 5/5 to normalize function    Time  6    Period  Weeks    Status  New      PT LONG TERM GOAL #5   Title  Improve FOTO to </= 27% limitation    Time  6    Period  Weeks    Status  New    Target Date  12/19/18      Additional Long Term Goals   Additional Long Term Goals  Yes            Plan - 12/02/18 1038    Clinical Impression Statement  Pt arrived with elevated pain level, depspite this gave good effort throughout session. Pt reported some slight discomfort with isometric external rot; all other exercises tolerated well. Pain reduced with use of vaso at end of session. Pt making gradual progress toward LTG #3; will continue to benefit from skilled PT to improve functional mobility.    Rehab Potential  Excellent    PT Frequency  3x / week    PT Duration  6 weeks    PT Treatment/Interventions  Patient/family education;ADLs/Self Care Home Management;Cryotherapy;Electrical Stimulation;Moist Heat;Manual techniques;Neuromuscular re-education;Therapeutic exercise;Iontophoresis 55m/ml Dexamethasone;Vasopneumatic Device    PT Next Visit Plan  advance exercises per protocol and pt tolerance    PT Home Exercise Plan  [email or web address]  Consulted and Agree with Plan of Care  Patient       Patient will benefit from skilled therapeutic intervention in order to improve the following deficits and impairments:  Pain, Impaired UE  functional use, Decreased strength, Decreased range of motion, Decreased activity tolerance  Visit Diagnosis: Stiffness of left shoulder, not elsewhere classified  Acute pain of left shoulder  Abnormal posture  Muscle weakness (generalized)  Localized edema     Problem List Patient Active Problem List   Diagnosis Date Noted  . Numbness and tingling of left side of face 10/17/2018  . Rotator cuff tear, left 09/05/2018  . Acute left-sided low back pain without sciatica 11/24/2016  . Right-sided chest wall pain 07/01/2016  . Eyelid abnormality 04/09/2016  . Hyperlipidemia 01/16/2014  . Transaminitis 01/16/2014  . Prediabetes 01/16/2014  . Annual physical exam 01/15/2014  . Essential hypertension, benign 01/15/2014  . Obesity 01/15/2014  . Meralgia paresthetica of right side 11/27/2013  . Osteoarthritis of left hip 09/18/2013    Gardiner Rhyme, SPTA 12/02/2018, 10:47 AM   Kerin Perna, PTA 12/02/18 11:12 AM   [place], [place], [address] Phone: [phone]   Fax:  [phone]  Name: Jerry Escobar MRN: [id number] Date of Birth: [birth year]

## 2018-12-05 ENCOUNTER — Encounter: Payer: Self-pay | Admitting: Physical Therapy

## 2018-12-05 ENCOUNTER — Other Ambulatory Visit: Payer: Self-pay

## 2018-12-05 ENCOUNTER — Ambulatory Visit (INDEPENDENT_AMBULATORY_CARE_PROVIDER_SITE_OTHER): Payer: BC Managed Care – PPO | Admitting: Physical Therapy

## 2018-12-05 DIAGNOSIS — M6281 Muscle weakness (generalized): Secondary | ICD-10-CM | POA: Diagnosis not present

## 2018-12-05 DIAGNOSIS — M25612 Stiffness of left shoulder, not elsewhere classified: Secondary | ICD-10-CM

## 2018-12-05 DIAGNOSIS — M25512 Pain in left shoulder: Secondary | ICD-10-CM | POA: Diagnosis not present

## 2018-12-05 DIAGNOSIS — R600 Localized edema: Secondary | ICD-10-CM

## 2018-12-05 DIAGNOSIS — R293 Abnormal posture: Secondary | ICD-10-CM

## 2018-12-05 NOTE — Patient Instructions (Signed)
Access Code: [email or web address]  URL: [email or web address]  Date: 12/05/2018  Prepared by: Almyra Free Jasiel Apachito   Exercises Supine Shoulder Flexion Extension AAROM with Dowel - 10 reps - 3 sets - 3-5 hold - 3x daily - 7x weekly Supine Shoulder Abduction AAROM with Dowel - 10 reps - 3 sets - 3-5 hold - 3x daily - 7x weekly Supine Shoulder External Rotation AAROM with Dowel - 10 reps - 3 sets - 1x daily - 7x weekly Standing Shoulder Abduction AAROM with Dowel - 10 reps - 3 sets - 3-5 sec hold - 2x daily - 7x weekly Supine Elbow Extension Stretch in Supination - 3 reps - 1 sets - 30 sec hold - 2x daily - 7x weekly Circular Shoulder Pendulum with Table Support - 10 reps - 2 sets - 1x daily - 7x weekly Seated Scapular Retraction - 10 reps - 1 sets - 5 hold - 2x daily - 7x weekly Isometric Shoulder Extension at Wall - 10 reps - 3 sets - 1x daily - 7x weekly Isometric Shoulder External Rotation at Wall - 10 reps - 3 sets - 1x daily - 7x weekly Standing Isometric Shoulder Internal Rotation at Doorway - 10 reps - 3 sets - 1x daily - 7x weekly Isometric Shoulder Abduction at Wall - 10 reps - 3 sets - 1x daily - 7x weekly

## 2018-12-05 NOTE — Addendum Note (Signed)
Addended by: Jaye Beagle on: 12/05/2018 04:37 PM   Modules accepted: Orders

## 2018-12-05 NOTE — Therapy (Signed)
[place], [place], [address] Phone: [phone]   Fax:  [phone]  Physical Therapy Treatment  Patient Details  Name: Jerry Escobar MRN: [id number] Date of Birth: 09-01-1970 Referring Provider (PT): Dr. Griffin Basil   Encounter Date: 12/05/2018  PT End of Session - 12/05/18 0936    Visit Number  8    Number of Visits  12    Date for PT Re-Evaluation  12/19/18    PT Start Time  0935    PT Stop Time  1023    PT Time Calculation (min)  48 min    Activity Tolerance  Patient tolerated treatment well    Behavior During Therapy  [facility] for tasks assessed/performed       Past Medical History:  Diagnosis Date  . Hyperlipidemia   . Hypertension   . Seasonal allergies   . Transaminitis     Past Surgical History:  Procedure Laterality Date  . BICEPT TENODESIS Left 10/06/2018   Procedure: BICEPS TENODESIS;  Surgeon: Hiram Gash, MD;  Location: [place];  Service: Orthopedics;  Laterality: Left;  . HERNIA REPAIR    . KNEE ARTHROSCOPY    . ROTATOR CUFF REPAIR    . SHOULDER ARTHROSCOPY WITH ROTATOR CUFF REPAIR AND SUBACROMIAL DECOMPRESSION Left 10/06/2018   Procedure: LEFT SHOULDER ARTHROSCOPY WITH ROTATOR CUFF REPAIR AND SUBACROMIAL DECOMPRESSION, EXTENSIVE DEBRIDEMENT, BICEP TENODESIS;  Surgeon: Hiram Gash, MD;  Location: [place];  Service: Orthopedics;  Laterality: Left;  BLOCK    There were no vitals filed for this visit.  Subjective Assessment - 12/05/18 0937    Subjective  Patient reports having some discomfort with certain movements, but no pain today.    Pertinent History  L RCR,SAD; 10/06/18; Rt shoulder RCR ~ 6 yrs ago; Rt knee surgery - scope x 2 most recent ~ 12 yrs ago; HTN    Patient Stated Goals  get motion and strength back    Currently in Pain?  No/denies         [facility] PT Assessment - 12/05/18 0001      AROM   Right Shoulder Flexion  50 Degrees    without compensation of traps   Right Shoulder ABduction  85 Degrees   supine   Right Shoulder External Rotation  55 Degrees    Left Shoulder Extension  65 Degrees   at side                  [facility] Adult PT Treatment/Exercise - 12/05/18 0001      Shoulder Exercises: Supine   ABduction  AROM;Left;10 reps      Shoulder Exercises: Sidelying   Flexion  Left;10 reps;AROM    Flexion Limitations  with elbow bent    Other Sidelying Exercises  AROM extension x 10      Shoulder Exercises: Standing   Internal Rotation  AROM;10 reps    Internal Rotation Limitations  with RUE support 5 sec hold x 10    Flexion  AROM;5 reps   see wall ladder below   Flexion Limitations  wall ladder x 5 to #19 and 20 with PT hand on UT to monitor compensation., then walk ladder to end range      Shoulder Exercises: Pulleys   Flexion  --   10 reps; 10 sec   Scaption  --   10 reps; 10 sec   Other Pulley Exercises  --  Shoulder Exercises: Isometric Strengthening   Flexion  5X10"    Extension  5X10"    External Rotation  5X10"    Internal Rotation  5X10"    ABduction  5X10"      Modalities   Modalities  Vasopneumatic      Vasopneumatic   Number Minutes Vasopneumatic   10 minutes    Vasopnuematic Location   Shoulder    Vasopneumatic Pressure  Low    Vasopneumatic Temperature   34 deg      Manual Therapy   Manual Therapy  Passive ROM    Passive ROM  into flexion and horizontal ABD             PT Education - 12/05/18 0952    Education Details  HEP h/o given for isometrics    Person(s) Educated  Patient    Methods  Explanation;Demonstration;Handout    Comprehension  Verbalized understanding;Returned demonstration          PT Long Term Goals - 11/07/18 1024      PT LONG TERM GOAL #1   Title  Ind with advanced HEP to improve function of LUE    Time  6    Period  Weeks    Status  New    Target Date  12/19/18      PT LONG TERM GOAL #2   Title  Increase AROM of Lt  shoulder to [facility] to perform ADLS without difficulty    Time  6    Period  Weeks    Status  New      PT LONG TERM GOAL #3   Title  Patient to report no Lt shoulder pain with functional activities using Lt UE    Time  6    Period  Weeks    Status  New      PT LONG TERM GOAL #4   Title  improved left UE to 5/5 to normalize function    Time  6    Period  Weeks    Status  New      PT LONG TERM GOAL #5   Title  Improve FOTO to </= 27% limitation    Time  6    Period  Weeks    Status  New    Target Date  12/19/18      Additional Long Term Goals   Additional Long Term Goals  Yes            Plan - 12/05/18 1417    Clinical Impression Statement  Patient reports no pain today and tolerated AROM very well without complaints of increased pain. SDLY flexion he could feel in deltoids, so we used a bent arm and this resolved. Patient is progressing with ROM well.    PT Treatment/Interventions  Patient/family education;ADLs/Self Care Home Management;Cryotherapy;Electrical Stimulation;Moist Heat;Manual techniques;Neuromuscular re-education;Therapeutic exercise;Iontophoresis 59m/ml Dexamethasone;Vasopneumatic Device    PT Next Visit Plan  advance exercises per protocol and pt tolerance    PT Home Exercise Plan  [id number]      Patient will benefit from skilled therapeutic intervention in order to improve the following deficits and impairments:  Pain, Impaired UE functional use, Decreased strength, Decreased range of motion, Decreased activity tolerance  Visit Diagnosis: Stiffness of left shoulder, not elsewhere classified  Muscle weakness (generalized)  Acute pain of left shoulder  Localized edema  Abnormal posture     Problem List Patient Active Problem List   Diagnosis Date Noted  . Numbness and tingling  of left side of face 10/17/2018  . Rotator cuff tear, left 09/05/2018  . Acute left-sided low back pain without sciatica 11/24/2016  . Right-sided chest wall pain  07/01/2016  . Eyelid abnormality 04/09/2016  . Hyperlipidemia 01/16/2014  . Transaminitis 01/16/2014  . Prediabetes 01/16/2014  . Annual physical exam 01/15/2014  . Essential hypertension, benign 01/15/2014  . Obesity 01/15/2014  . Meralgia paresthetica of right side 11/27/2013  . Osteoarthritis of left hip 09/18/2013    Madelyn Flavors PT 12/05/2018, 2:25 PM  [facility] [place], [place], [address] Phone: [phone]   Fax:  [phone]  Name: Sigurd Pugh MRN: [id number] Date of Birth: 1970-09-11

## 2018-12-06 ENCOUNTER — Ambulatory Visit: Payer: BC Managed Care – PPO

## 2018-12-09 ENCOUNTER — Other Ambulatory Visit: Payer: Self-pay

## 2018-12-09 ENCOUNTER — Ambulatory Visit (INDEPENDENT_AMBULATORY_CARE_PROVIDER_SITE_OTHER): Payer: BC Managed Care – PPO | Admitting: Physical Therapy

## 2018-12-09 DIAGNOSIS — M25612 Stiffness of left shoulder, not elsewhere classified: Secondary | ICD-10-CM | POA: Diagnosis not present

## 2018-12-09 DIAGNOSIS — R600 Localized edema: Secondary | ICD-10-CM | POA: Diagnosis not present

## 2018-12-09 DIAGNOSIS — M6281 Muscle weakness (generalized): Secondary | ICD-10-CM | POA: Diagnosis not present

## 2018-12-09 DIAGNOSIS — R293 Abnormal posture: Secondary | ICD-10-CM

## 2018-12-09 DIAGNOSIS — M25512 Pain in left shoulder: Secondary | ICD-10-CM | POA: Diagnosis not present

## 2018-12-09 NOTE — Therapy (Signed)
[place], [place], [address] Phone: [phone]   Fax:  [phone]  Physical Therapy Treatment  Patient Details  Name: Jerry Escobar MRN: [id number] Date of Birth: 12/14/1970 Referring Provider (PT): Dr. Griffin Escobar   Encounter Date: 12/09/2018  PT End of Session - 12/09/18 1048    Visit Number  9    Number of Visits  12    Date for PT Re-Evaluation  12/19/18    PT Start Time  0955    PT Stop Time  1050    PT Time Calculation (min)  55 min    Activity Tolerance  Patient tolerated treatment well    Behavior During Therapy  [facility] for tasks assessed/performed       Past Medical History:  Diagnosis Date  . Hyperlipidemia   . Hypertension   . Seasonal allergies   . Transaminitis     Past Surgical History:  Procedure Laterality Date  . BICEPT TENODESIS Left 10/06/2018   Procedure: BICEPS TENODESIS;  Surgeon: Jerry Gash, MD;  Location: [place];  Service: Orthopedics;  Laterality: Left;  . HERNIA REPAIR    . KNEE ARTHROSCOPY    . ROTATOR CUFF REPAIR    . SHOULDER ARTHROSCOPY WITH ROTATOR CUFF REPAIR AND SUBACROMIAL DECOMPRESSION Left 10/06/2018   Procedure: LEFT SHOULDER ARTHROSCOPY WITH ROTATOR CUFF REPAIR AND SUBACROMIAL DECOMPRESSION, EXTENSIVE DEBRIDEMENT, BICEP TENODESIS;  Surgeon: Jerry Gash, MD;  Location: [place];  Service: Orthopedics;  Laterality: Left;  BLOCK    There were no vitals filed for this visit.  Subjective Assessment - 12/09/18 1045    Subjective  pain only about a 3-4 today in his Lt shoulder, "it is coming along"    Pertinent History  L RCR,SAD; 10/06/18; Rt shoulder RCR ~ 6 yrs ago; Rt knee surgery - scope x 2 most recent ~ 12 yrs ago; HTN    Patient Stated Goals  get motion and strength back                       [facility] Adult PT Treatment/Exercise - 12/09/18 0001      Shoulder Exercises: Supine   ABduction   AROM;Left;10 reps      Shoulder Exercises: Sidelying   Flexion  Left;10 reps;AROM    Flexion Limitations  with elbow bent    Other Sidelying Exercises  AROM extension x 10    Other Sidelying Exercises  AROM ER X 10 reps gentle in limited ROM to tolerance      Shoulder Exercises: Standing   Internal Rotation  AROM;10 reps    Internal Rotation Limitations  with RUE support 5 sec hold x 10    Flexion  AROM;5 reps   see wall ladder below   Flexion Limitations  wall ladder x 10 ea for flexion and abd ROM      Shoulder Exercises: Pulleys   Flexion  3 minutes   10 sec holds   Scaption  3 minutes   10 sec holds     Shoulder Exercises: Isometric Strengthening   Flexion  5X10"    Extension  5X10"    External Rotation  5X10"    Internal Rotation  5X10"    ABduction  5X10"      Modalities   Modalities  Vasopneumatic      Vasopneumatic   Number Minutes Vasopneumatic   10 minutes    Vasopnuematic Location   Shoulder  Vasopneumatic Pressure  Medium    Vasopneumatic Temperature   34 deg      Manual Therapy   Manual Therapy  Passive ROM    Passive ROM  PROM all planes to tolerance                  PT Long Term Goals - 11/07/18 1024      PT LONG TERM GOAL #1   Title  Ind with advanced HEP to improve function of LUE    Time  6    Period  Weeks    Status  New    Target Date  12/19/18      PT LONG TERM GOAL #2   Title  Increase AROM of Lt shoulder to [facility] to perform ADLS without difficulty    Time  6    Period  Weeks    Status  New      PT LONG TERM GOAL #3   Title  Patient to report no Lt shoulder pain with functional activities using Lt UE    Time  6    Period  Weeks    Status  New      PT LONG TERM GOAL #4   Title  improved left UE to 5/5 to normalize function    Time  6    Period  Weeks    Status  New      PT LONG TERM GOAL #5   Title  Improve FOTO to </= 27% limitation    Time  6    Period  Weeks    Status  New    Target Date  12/19/18       Additional Long Term Goals   Additional Long Term Goals  Yes            Plan - 12/09/18 1048    Clinical Impression Statement  Continued to focus on ROM, stretching, and strengthening per protocol. He will be able to progress strength more after 12 weeks (12/29/18). He had good tolerance to session and was treated with vaso for pain, swelling, soreness.    PT Treatment/Interventions  Patient/family education;ADLs/Self Care Home Management;Cryotherapy;Electrical Stimulation;Moist Heat;Manual techniques;Neuromuscular re-education;Therapeutic exercise;Iontophoresis 78m/ml Dexamethasone;Vasopneumatic Device    PT Next Visit Plan  advance exercises per protocol and pt tolerance    PT Home Exercise Plan  [id number]      Patient will benefit from skilled therapeutic intervention in order to improve the following deficits and impairments:  Pain, Impaired UE functional use, Decreased strength, Decreased range of motion, Decreased activity tolerance  Visit Diagnosis: Stiffness of left shoulder, not elsewhere classified  Muscle weakness (generalized)  Acute pain of left shoulder  Localized edema  Abnormal posture     Problem List Patient Active Problem List   Diagnosis Date Noted  . Numbness and tingling of left side of face 10/17/2018  . Rotator cuff tear, left 09/05/2018  . Acute left-sided low back pain without sciatica 11/24/2016  . Right-sided chest wall pain 07/01/2016  . Eyelid abnormality 04/09/2016  . Hyperlipidemia 01/16/2014  . Transaminitis 01/16/2014  . Prediabetes 01/16/2014  . Annual physical exam 01/15/2014  . Essential hypertension, benign 01/15/2014  . Obesity 01/15/2014  . Meralgia paresthetica of right side 11/27/2013  . Osteoarthritis of left hip 09/18/2013    BSilvestre Mesi11/20/2020, 10:50 AM  [facility] [address]Phone: [phone]  Fax:   [phone] Name: Jerry  Escobar MRN: [id number] Date of Birth: [birth year]

## 2018-12-12 ENCOUNTER — Other Ambulatory Visit: Payer: Self-pay

## 2018-12-12 ENCOUNTER — Ambulatory Visit (INDEPENDENT_AMBULATORY_CARE_PROVIDER_SITE_OTHER): Payer: BC Managed Care – PPO | Admitting: Physical Therapy

## 2018-12-12 DIAGNOSIS — M25612 Stiffness of left shoulder, not elsewhere classified: Secondary | ICD-10-CM | POA: Diagnosis not present

## 2018-12-12 DIAGNOSIS — M6281 Muscle weakness (generalized): Secondary | ICD-10-CM | POA: Diagnosis not present

## 2018-12-12 DIAGNOSIS — M25512 Pain in left shoulder: Secondary | ICD-10-CM | POA: Diagnosis not present

## 2018-12-12 DIAGNOSIS — R600 Localized edema: Secondary | ICD-10-CM | POA: Diagnosis not present

## 2018-12-12 NOTE — Therapy (Signed)
[place], [place], [address] Phone: [phone]   Fax:  [phone]  Physical Therapy Treatment  Patient Details  Name: Jerry Escobar MRN: [id number] Date of Birth: [birth year] Referring Provider (PT): Dr. Griffin Basil   Encounter Date: 12/12/2018  PT End of Session - 12/12/18 0934    Visit Number  10    Number of Visits  12    Date for PT Re-Evaluation  12/19/18    PT Start Time  0932    PT Stop Time  1026    PT Time Calculation (min)  54 min    Activity Tolerance  Patient tolerated treatment well    Behavior During Therapy  [facility] for tasks assessed/performed       Past Medical History:  Diagnosis Date  . Hyperlipidemia   . Hypertension   . Seasonal allergies   . Transaminitis     Past Surgical History:  Procedure Laterality Date  . BICEPT TENODESIS Left 10/06/2018   Procedure: BICEPS TENODESIS;  Surgeon: Hiram Gash, MD;  Location: [place];  Service: Orthopedics;  Laterality: Left;  . HERNIA REPAIR    . KNEE ARTHROSCOPY    . ROTATOR CUFF REPAIR    . SHOULDER ARTHROSCOPY WITH ROTATOR CUFF REPAIR AND SUBACROMIAL DECOMPRESSION Left 10/06/2018   Procedure: LEFT SHOULDER ARTHROSCOPY WITH ROTATOR CUFF REPAIR AND SUBACROMIAL DECOMPRESSION, EXTENSIVE DEBRIDEMENT, BICEP TENODESIS;  Surgeon: Hiram Gash, MD;  Location: [place];  Service: Orthopedics;  Laterality: Left;  BLOCK    There were no vitals filed for this visit.  Subjective Assessment - 12/12/18 0935    Subjective  Patient feeling a little catch in left shoulder with TE since Friday. Patient also c/o of increased soreness to 8/10 due to longer holds he thinks.    Pertinent History  L RCR,SAD; 10/06/18; Rt shoulder RCR ~ 6 yrs ago; Rt knee surgery - scope x 2 most recent ~ 12 yrs ago; HTN    Patient Stated Goals  get motion and strength back    Currently in Pain?  Yes    Pain Score  8     Pain Location   Shoulder    Pain Orientation  Left    Pain Descriptors / Indicators  Sore    Pain Type  Surgical pain         [facility] PT Assessment - 12/12/18 0001      AROM   Right Shoulder Flexion  152 Degrees   supine   Right Shoulder External Rotation  66 Degrees   at 45 deg abd   Left Shoulder Extension  63 Degrees   at 45 deg abd                  [facility] Adult PT Treatment/Exercise - 12/12/18 0001      Shoulder Exercises: Supine   Flexion  AROM;Left;10 reps    Flexion Limitations  added some elbow flexion to decrease painful points    ABduction  AROM;Left;10 reps      Shoulder Exercises: Standing   External Rotation  AROM;10 reps    Internal Rotation  AROM;10 reps    Internal Rotation Limitations  with RUE support 5 sec hold x 10    Flexion Limitations  wall ladder x 10 ea for flexion and abd ROM; also wall slides x 5       Shoulder Exercises: Pulleys   Flexion  3 minutes   10 sec holds  Scaption  3 minutes   10 sec holds     Shoulder Exercises: Isometric Strengthening   ADduction  5X10"   2sets     Modalities   Modalities  Electrical Stimulation;Vasopneumatic      Electrical Stimulation   Electrical Stimulation Location  Left shoulder    Electrical Stimulation Action  IFC    Electrical Stimulation Parameters  to tolerance x 10 min    Electrical Stimulation Goals  Pain      Vasopneumatic   Number Minutes Vasopneumatic   10 minutes    Vasopnuematic Location   Shoulder    Vasopneumatic Pressure  Low    Vasopneumatic Temperature   34 deg      Manual Therapy   Manual Therapy  Soft tissue mobilization    Soft tissue mobilization  to left pectorals                   PT Long Term Goals - 12/12/18 1732      PT LONG TERM GOAL #1   Title  Ind with advanced HEP to improve function of LUE    Status  Partially Met      PT LONG TERM GOAL #2   Title  Increase AROM of Lt shoulder to [facility] to perform ADLS without difficulty    Status  On-going      PT  LONG TERM GOAL #3   Title  Patient to report no Lt shoulder pain with functional activities using Lt UE    Status  On-going      PT LONG TERM GOAL #4   Title  improved left UE to 5/5 to normalize function    Status  On-going            Plan - 12/12/18 1732    Clinical Impression Statement  Patient progressing well with ROM. He is having some increased pain after last visit so we added estim today with vaso for pain and edema.    PT Frequency  3x / week    PT Duration  6 weeks    PT Treatment/Interventions  Patient/family education;ADLs/Self Care Home Management;Cryotherapy;Electrical Stimulation;Moist Heat;Manual techniques;Neuromuscular re-education;Therapeutic exercise;Iontophoresis 61m/ml Dexamethasone;Vasopneumatic Device    PT Next Visit Plan  advance exercises per protocol and pt tolerance    PT Home Exercise Plan  [id number]   Consulted and Agree with Plan of Care  Patient       Patient will benefit from skilled therapeutic intervention in order to improve the following deficits and impairments:  Pain, Impaired UE functional use, Decreased strength, Decreased range of motion, Decreased activity tolerance  Visit Diagnosis: Stiffness of left shoulder, not elsewhere classified  Muscle weakness (generalized)  Acute pain of left shoulder  Localized edema     Problem List Patient Active Problem List   Diagnosis Date Noted  . Numbness and tingling of left side of face 10/17/2018  . Rotator cuff tear, left 09/05/2018  . Acute left-sided low back pain without sciatica 11/24/2016  . Right-sided chest wall pain 07/01/2016  . Eyelid abnormality 04/09/2016  . Hyperlipidemia 01/16/2014  . Transaminitis 01/16/2014  . Prediabetes 01/16/2014  . Annual physical exam 01/15/2014  . Essential hypertension, benign 01/15/2014  . Obesity 01/15/2014  . Meralgia paresthetica of right side 11/27/2013  . Osteoarthritis of left hip 09/18/2013   Stephani Janak PT 12/12/2018, 5:38  PM  [facility] [address]Phone: [phone]  Fax:  [phone]  Name: Jerry Escobar MRN: [id number] Date of Birth: 02/02/1970

## 2018-12-19 ENCOUNTER — Encounter: Payer: Self-pay | Admitting: Physical Therapy

## 2018-12-19 ENCOUNTER — Other Ambulatory Visit: Payer: Self-pay

## 2018-12-19 ENCOUNTER — Ambulatory Visit (INDEPENDENT_AMBULATORY_CARE_PROVIDER_SITE_OTHER): Payer: BC Managed Care – PPO | Admitting: Physical Therapy

## 2018-12-19 DIAGNOSIS — M6281 Muscle weakness (generalized): Secondary | ICD-10-CM

## 2018-12-19 DIAGNOSIS — M25612 Stiffness of left shoulder, not elsewhere classified: Secondary | ICD-10-CM

## 2018-12-19 DIAGNOSIS — M25512 Pain in left shoulder: Secondary | ICD-10-CM

## 2018-12-19 DIAGNOSIS — R600 Localized edema: Secondary | ICD-10-CM | POA: Diagnosis not present

## 2018-12-19 NOTE — Therapy (Addendum)
[place], [place], [address] Phone: [phone]   Fax:  [phone]  Physical Therapy Treatment  Patient Details  Name: Jerry Escobar MRN: [id number] Date of Birth: 06/26/1970 Referring Provider (PT): Dr. Griffin Basil   Encounter Date: 12/19/2018  PT End of Session - 12/19/18 1019    Visit Number  11    Number of Visits  12    Date for PT Re-Evaluation  12/19/18    PT Start Time  1019    PT Stop Time  1102    PT Time Calculation (min)  43 min    Activity Tolerance  Patient tolerated treatment well    Behavior During Therapy  [facility] for tasks assessed/performed       Past Medical History:  Diagnosis Date  . Hyperlipidemia   . Hypertension   . Seasonal allergies   . Transaminitis     Past Surgical History:  Procedure Laterality Date  . BICEPT TENODESIS Left 10/06/2018   Procedure: BICEPS TENODESIS;  Surgeon: Hiram Gash, MD;  Location: [place];  Service: Orthopedics;  Laterality: Left;  . HERNIA REPAIR    . KNEE ARTHROSCOPY    . ROTATOR CUFF REPAIR    . SHOULDER ARTHROSCOPY WITH ROTATOR CUFF REPAIR AND SUBACROMIAL DECOMPRESSION Left 10/06/2018   Procedure: LEFT SHOULDER ARTHROSCOPY WITH ROTATOR CUFF REPAIR AND SUBACROMIAL DECOMPRESSION, EXTENSIVE DEBRIDEMENT, BICEP TENODESIS;  Surgeon: Hiram Gash, MD;  Location: [place];  Service: Orthopedics;  Laterality: Left;  BLOCK    There were no vitals filed for this visit.  Subjective Assessment - 12/19/18 1022    Subjective  The catch in his shoulder has gotten better since his last treatment session. He feels that the use of e-stim helped reduce his pain.    Currently in Pain?  No/denies    Aggravating Factors   moving arm    Pain Relieving Factors  ice         [facility] PT Assessment - 12/19/18 0001      Assessment   Medical Diagnosis  Lt RCR, biceps tenodesis, SAD    Referring Provider (PT)  Dr. Griffin Basil    Onset Date/Surgical Date  10/06/18    Hand Dominance  Right    Next MD Visit  12/30/18    Prior Therapy  for right shoulder      PROM   Left Shoulder Flexion  145 Degrees   AAROM with cane in supine   Left Shoulder ABduction  135 Degrees   scaption   Left Shoulder External Rotation  74 Degrees   AAROM with arm in supported scaption, cane in supine      [facility] Adult PT Treatment/Exercise - 12/19/18 0001      Shoulder Exercises: Supine   External Rotation  AAROM;Left   8 reps with cane, 10 sec holds   Flexion  AAROM;Both;12 reps   with cane, 5-10 sec holds     Shoulder Exercises: Standing   Internal Rotation  AAROM;Left;12 reps   self assisted   Extension  AAROM;Both;12 reps   with cane     Shoulder Exercises: Pulleys   Flexion  3 minutes   10 sec holds   Scaption  3 minutes   10 sec holds     Electrical Stimulation   Electrical Stimulation Location  Left shoulder    Electrical Stimulation Action  IFC    Electrical Stimulation Parameters  to tolerance    Electrical Stimulation Goals  Pain      Vasopneumatic   Number Minutes Vasopneumatic   10 minutes    Vasopnuematic Location   Shoulder    Vasopneumatic Pressure  Low    Vasopneumatic Temperature   34 deg      Manual Therapy   Passive ROM  PROM to Lt UE into extension, flexion, scaption, and external rot.        PT Long Term Goals - 12/12/18 1732      PT LONG TERM GOAL #1   Title  Ind with advanced HEP to improve function of LUE    Status  Partially Met      PT LONG TERM GOAL #2   Title  Increase AROM of Lt shoulder to [facility] to perform ADLS without difficulty    Status  On-going      PT LONG TERM GOAL #3   Title  Patient to report no Lt shoulder pain with functional activities using Lt UE    Status  On-going      PT LONG TERM GOAL #4   Title  improved left UE to 5/5 to normalize function    Status  On-going            Plan - 12/19/18 1114    Clinical Impression Statement  Pt demonstrated  improved Lt shoulder ER AAROM in supine.  He reported some soreness with completion of AAROM; reduced with use of estim at end of session.  Pt is making good progress toward LTG #2 and will continue to benefit from continued PT intervention to maximize functional mobility.    Rehab Potential  Excellent    PT Frequency  3x / week    PT Duration  6 weeks    PT Treatment/Interventions  Patient/family education;ADLs/Self Care Home Management;Cryotherapy;Electrical Stimulation;Moist Heat;Manual techniques;Neuromuscular re-education;Therapeutic exercise;Iontophoresis 19m/ml Dexamethasone;Vasopneumatic Device    PT Next Visit Plan  assess goals - end of POC.  Renewal, and MD note.   PT Home Exercise Plan  [id number]   Consulted and Agree with Plan of Care  Patient       Patient will benefit from skilled therapeutic intervention in order to improve the following deficits and impairments:  Pain, Impaired UE functional use, Decreased strength, Decreased range of motion, Decreased activity tolerance  Visit Diagnosis: Stiffness of left shoulder, not elsewhere classified  Muscle weakness (generalized)  Acute pain of left shoulder  Localized edema     Problem List Patient Active Problem List   Diagnosis Date Noted  . Numbness and tingling of left side of face 10/17/2018  . Rotator cuff tear, left 09/05/2018  . Acute left-sided low back pain without sciatica 11/24/2016  . Right-sided chest wall pain 07/01/2016  . Eyelid abnormality 04/09/2016  . Hyperlipidemia 01/16/2014  . Transaminitis 01/16/2014  . Prediabetes 01/16/2014  . Annual physical exam 01/15/2014  . Essential hypertension, benign 01/15/2014  . Obesity 01/15/2014  . Meralgia paresthetica of right side 11/27/2013  . Osteoarthritis of left hip 09/18/2013    KGardiner Rhyme SPTA 12/19/2018, 12:36 PM   JKerin Perna PTA 12/19/18 12:42 PM   [place] [address]Phone: [phone]  Fax:  [phone] Name: Jerry LaresMRN: [id number] of Birth: [birth year]

## 2018-12-23 ENCOUNTER — Encounter: Payer: BC Managed Care – PPO | Admitting: Physical Therapy

## 2018-12-26 ENCOUNTER — Ambulatory Visit (INDEPENDENT_AMBULATORY_CARE_PROVIDER_SITE_OTHER): Payer: BC Managed Care – PPO | Admitting: Sports Medicine

## 2018-12-26 ENCOUNTER — Ambulatory Visit (INDEPENDENT_AMBULATORY_CARE_PROVIDER_SITE_OTHER): Payer: BC Managed Care – PPO | Admitting: Physical Therapy

## 2018-12-26 ENCOUNTER — Encounter: Payer: Self-pay | Admitting: Sports Medicine

## 2018-12-26 ENCOUNTER — Encounter: Payer: Self-pay | Admitting: Physical Therapy

## 2018-12-26 ENCOUNTER — Other Ambulatory Visit: Payer: Self-pay

## 2018-12-26 DIAGNOSIS — I10 Essential (primary) hypertension: Secondary | ICD-10-CM

## 2018-12-26 DIAGNOSIS — M6281 Muscle weakness (generalized): Secondary | ICD-10-CM

## 2018-12-26 DIAGNOSIS — R293 Abnormal posture: Secondary | ICD-10-CM

## 2018-12-26 DIAGNOSIS — N529 Male erectile dysfunction, unspecified: Secondary | ICD-10-CM | POA: Diagnosis not present

## 2018-12-26 DIAGNOSIS — R600 Localized edema: Secondary | ICD-10-CM

## 2018-12-26 DIAGNOSIS — M25612 Stiffness of left shoulder, not elsewhere classified: Secondary | ICD-10-CM | POA: Diagnosis not present

## 2018-12-26 DIAGNOSIS — M25512 Pain in left shoulder: Secondary | ICD-10-CM

## 2018-12-26 MED ORDER — TADALAFIL 5 MG PO TABS: 5.0000 mg | ORAL_TABLET | Freq: Every day | ORAL | 3 refills | Status: DC

## 2018-12-26 MED ORDER — AMLODIPINE BESYLATE 10 MG PO TABS: 10.0000 mg | ORAL_TABLET | Freq: Every day | ORAL | 11 refills | Status: DC

## 2018-12-26 NOTE — Assessment & Plan Note (Signed)
Rather than decrease blood pressure medicines I am going to add daily Cialis with a good Rx coupon. He will MyChart me in a week and let me know how things are going, he is having some obstructive uropathy symptoms which will likely also improve with Cialis.

## 2018-12-26 NOTE — Therapy (Addendum)
[place], [place], [address] Phone: [phone]   Fax:  [phone]  Physical Therapy Treatment  Patient Details  Name: Jerry Escobar MRN: [id number] Date of Birth: [birth year] Referring Provider (PT): Dr. Griffin Basil   Encounter Date: 12/26/2018  PT End of Session - 12/26/18 1114    Visit Number  12    Number of Visits  24    Date for PT Re-Evaluation 02/07/19   PT Start Time  1108   pt arrived late   PT Stop Time  1146    PT Time Calculation (min)  38 min    Activity Tolerance  Patient tolerated treatment well    Behavior During Therapy  [facility] for tasks assessed/performed       Past Medical History:  Diagnosis Date  . Hyperlipidemia   . Hypertension   . Seasonal allergies   . Transaminitis     Past Surgical History:  Procedure Laterality Date  . BICEPT TENODESIS Left 10/06/2018   Procedure: BICEPS TENODESIS;  Surgeon: Hiram Gash, MD;  Location: [place];  Service: Orthopedics;  Laterality: Left;  . HERNIA REPAIR    . KNEE ARTHROSCOPY    . ROTATOR CUFF REPAIR    . SHOULDER ARTHROSCOPY WITH ROTATOR CUFF REPAIR AND SUBACROMIAL DECOMPRESSION Left 10/06/2018   Procedure: LEFT SHOULDER ARTHROSCOPY WITH ROTATOR CUFF REPAIR AND SUBACROMIAL DECOMPRESSION, EXTENSIVE DEBRIDEMENT, BICEP TENODESIS;  Surgeon: Hiram Gash, MD;  Location: [facility];  Service: Orthopedics;  Laterality: Left;  BLOCK    There were no vitals filed for this visit.  Subjective Assessment - 12/26/18 1718    Subjective  Pt reports his Lt shoulder is sore, likely from sleeping on it.  Otherwise, HEP is going well. Returns to Dr this week.    Currently in Pain?  Yes    Pain Score  3     Pain Location  Shoulder    Pain Orientation  Left    Pain Descriptors / Indicators  Sore    Aggravating Factors   sleeping on Lt side.    Pain Relieving Factors  ice         [facility] PT Assessment - 12/26/18  0001      Assessment   Medical Diagnosis  Lt RCR, biceps tenodesis, SAD    Referring Provider (PT)  Dr. Griffin Basil    Onset Date/Surgical Date  10/06/18    Hand Dominance  Right    Next MD Visit  12/30/18    Prior Therapy  for right shoulder      Observation/Other Assessments   Focus on Therapeutic Outcomes (FOTO)   43% limitation      AROM   Right/Left Shoulder  Left   standing   Left Shoulder Extension  56 Degrees    Left Shoulder Flexion  135 Degrees    Left Shoulder ABduction  130 Degrees    Left Shoulder Internal Rotation  --   Lt thumb to T10   Left Shoulder External Rotation  75 Degrees   scaption to 80 deg     PROM   Left Shoulder Flexion  152 Degrees    Left Shoulder External Rotation  83 Degrees       [facility] Adult PT Treatment/Exercise - 12/26/18 0001      Shoulder Exercises: Supine   External Rotation  AAROM;Left   10 reps with cane   Flexion  AAROM;Both;10 reps   with cane   ABduction  AAROM;Left;10 reps   with cane (scaption)     Shoulder Exercises: Seated   Other Seated Exercises  Lt scap depression with hand on orange pball x 5 sec x 10 reps       Shoulder Exercises: Standing   Flexion  AROM;Left;5 reps    ABduction  AAROM;Left;5 reps   5 sec hold (scaption)   Extension  AAROM;Both;10 reps   with cane     Shoulder Exercises: Pulleys   Flexion  3 minutes   10 sec holds   Scaption  3 minutes   10 sec holds     Shoulder Exercises: Stretch   Internal Rotation Stretch  5 reps   10 sec hold; RUE assisting LUE.    Other Shoulder Stretches  bilat bicep stretch holding door frame x 20 sec x 2 reps     Other Shoulder Stretches  low and mid- position doorway stretch x 20 sec x 2 reps each side.       Vasopneumatic   Number Minutes Vasopneumatic   10 minutes    Vasopnuematic Location   Shoulder   Lt   Vasopneumatic Pressure  Low    Vasopneumatic Temperature   34 deg                  PT Long Term Goals - 12/12/18 1732      PT LONG TERM  GOAL #1   Title  Ind with advanced HEP to improve function of LUE    Status  Partially Met      PT LONG TERM GOAL #2   Title  Increase AROM of Lt shoulder to [facility] to perform ADLS without difficulty    Status  On-going      PT LONG TERM GOAL #3   Title  Patient to report no Lt shoulder pain with functional activities using Lt UE    Status  On-going      PT LONG TERM GOAL #4   Title  improved left UE to 5/5 to normalize function    Status  On-going            Plan - 12/26/18 1122    Clinical Impression Statement  Pt's Lt shoulder ROM gradually improving.  He was able to tolerate low and midlevel doorway stretch, but not the high position; able to tolerate supine scaption with cane instead.  Pt progressing well towards LTG within rehab protocol.  Pt will benefit from continued PT intervention to maximize functional mobility independence.    Rehab Potential  Excellent    PT Frequency  3x / week    PT Duration  6 weeks    PT Treatment/Interventions  Patient/family education;ADLs/Self Care Home Management;Cryotherapy;Electrical Stimulation;Moist Heat;Manual techniques;Neuromuscular re-education;Therapeutic exercise;Iontophoresis 60m/ml Dexamethasone;Vasopneumatic Device    PT Next Visit Plan  advance exercises per protocol and pt tolerance    PT Home Exercise Plan  [id number]   Consulted and Agree with Plan of Care  Patient       Patient will benefit from skilled therapeutic intervention in order to improve the following deficits and impairments:  Pain, Impaired UE functional use, Decreased strength, Decreased range of motion, Decreased activity tolerance  Visit Diagnosis: Stiffness of left shoulder, not elsewhere classified  Muscle weakness (generalized)  Acute pain of left shoulder  Localized edema  Abnormal posture     Problem List Patient Active Problem List   Diagnosis Date Noted  . Erectile dysfunction 12/26/2018  . Numbness and tingling of left side of  face  10/17/2018  . Rotator cuff tear, left 09/05/2018  . Acute left-sided low back pain without sciatica 11/24/2016  . Right-sided chest wall pain 07/01/2016  . Eyelid abnormality 04/09/2016  . Hyperlipidemia 01/16/2014  . Transaminitis 01/16/2014  . Prediabetes 01/16/2014  . Annual physical exam 01/15/2014  . Essential hypertension, benign 01/15/2014  . Obesity 01/15/2014  . Meralgia paresthetica of right side 11/27/2013  . Osteoarthritis of left hip 09/18/2013   Kerin Perna, PTA 12/26/18 5:31 PM  [place], [place], [address] Phone: [phone]   Fax:  [phone]  Name: Jerry Escobar MRN: [id number] Date of Birth: Jul 07, 1970  Madelyn Flavors, PT 12/27/18 2:25 PM'  [facility] [place], [place] [address]  [phone] (office) [phone] (fax)

## 2018-12-26 NOTE — Progress Notes (Signed)
Subjective:    CC: Several issues  HPI: Hypertension: Under better control but not perfect.  Mild headaches occasionally.  No visual changes or chest pain.  Erectile dysfunction: Worsened after starting blood pressure medication, he understands the importance of continuing blood pressure medicine and finding another option for his ED.  Symptoms are moderate, persistent.  Difficulty with achieving, and difficulty with duration of erections.  On further questioning he also endorses some obstructive uropathy symptoms.  I reviewed the past medical history, family history, social history, surgical history, and allergies today and no changes were needed.  Please see the problem list section below in epic for further details.  Past Medical History: Past Medical History:  Diagnosis Date  . Hyperlipidemia   . Hypertension   . Seasonal allergies   . Transaminitis    Past Surgical History: Past Surgical History:  Procedure Laterality Date  . BICEPT TENODESIS Left 10/06/2018   Procedure: BICEPS TENODESIS;  Surgeon: Hiram Gash, MD;  Location: [place];  Service: Orthopedics;  Laterality: Left;  . HERNIA REPAIR    . KNEE ARTHROSCOPY    . ROTATOR CUFF REPAIR    . SHOULDER ARTHROSCOPY WITH ROTATOR CUFF REPAIR AND SUBACROMIAL DECOMPRESSION Left 10/06/2018   Procedure: LEFT SHOULDER ARTHROSCOPY WITH ROTATOR CUFF REPAIR AND SUBACROMIAL DECOMPRESSION, EXTENSIVE DEBRIDEMENT, BICEP TENODESIS;  Surgeon: Hiram Gash, MD;  Location: [place] [facility] [place];  Service: Orthopedics;  Laterality: Left;  BLOCK   Social History: Social History   Socioeconomic History  . Marital status: Single    Spouse name: Not on file  . Number of children: 3  . Years of education: Not on file  . Highest education level: Not on file  Occupational History  . Not on file  Social Needs  . Financial resource strain: Not on file  . Food insecurity    Worry: Not on file    Inability: Not on file  .  Transportation needs    Medical: Not on file    Non-medical: Not on file  Tobacco Use  . Smoking status: Never Smoker  . Smokeless tobacco: Never Used  Substance and Sexual Activity  . Alcohol use: Yes    Alcohol/week: 3.0 standard drinks    Types: 3 Standard drinks or equivalent per week    Comment: Occasional  . Drug use: No  . Sexual activity: Not on file  Lifestyle  . Physical activity    Days per week: Not on file    Minutes per session: Not on file  . Stress: Not on file  Relationships  . Social Herbalist on phone: Not on file    Gets together: Not on file    Attends religious service: Not on file    Active member of club or organization: Not on file    Attends meetings of clubs or organizations: Not on file    Relationship status: Not on file  Other Topics Concern  . Not on file  Social History Narrative  . Not on file   Family History: Family History  Problem Relation Age of Onset  . Cancer Father        prostate  . Breast cancer Paternal Grandmother    Allergies: No Known Allergies Medications: See med rec.  Review of Systems: No fevers, chills, night sweats, weight loss, chest pain, or shortness of breath.   Objective:    General: Well Developed, well nourished, and in no acute distress.  Neuro: Alert and  oriented x3, extra-ocular muscles intact, sensation grossly intact.  HEENT: Normocephalic, atraumatic, pupils equal round reactive to light, neck supple, no masses, no lymphadenopathy, thyroid nonpalpable.  Skin: Warm and dry, no rashes. Cardiac: Regular rate and rhythm, no murmurs rubs or gallops, no lower extremity edema.  Respiratory: Clear to auscultation bilaterally. Not using accessory muscles, speaking in full sentences.  Impression and Recommendations:    Essential hypertension, benign Increasing amlodipine to 10 mg, he will also get home blood pressure cuff and to MyChart me his blood pressure readings in a week. Also with  difficult to control blood pressures I am pulling the trigger for a home sleep study.  Erectile dysfunction Rather than decrease blood pressure medicines I am going to add daily Cialis with a good Rx coupon. He will MyChart me in a week and let me know how things are going, he is having some obstructive uropathy symptoms which will likely also improve with Cialis.  I spent 25 minutes with this patient, greater than 50% was face-to-face time counseling regarding the above diagnoses.  ___________________________________________ Gwen Her. Dianah Field, M.D., ABFM., CAQSM. Primary Care and Sports Medicine [facility]  Adjunct Professor of [employer]

## 2018-12-26 NOTE — Assessment & Plan Note (Signed)
Increasing amlodipine to 10 mg, he will also get home blood pressure cuff and to MyChart me his blood pressure readings in a week. Also with difficult to control blood pressures I am pulling the trigger for a home sleep study.

## 2018-12-27 NOTE — Addendum Note (Signed)
Addended by: Jaye Beagle on: 12/27/2018 02:28 PM   Modules accepted: Orders

## 2018-12-30 ENCOUNTER — Encounter: Payer: Self-pay | Admitting: Rehabilitative and Restorative Service Providers"

## 2018-12-30 ENCOUNTER — Ambulatory Visit (INDEPENDENT_AMBULATORY_CARE_PROVIDER_SITE_OTHER): Payer: BC Managed Care – PPO | Admitting: Rehabilitative and Restorative Service Providers"

## 2018-12-30 ENCOUNTER — Other Ambulatory Visit: Payer: Self-pay

## 2018-12-30 DIAGNOSIS — R293 Abnormal posture: Secondary | ICD-10-CM

## 2018-12-30 DIAGNOSIS — M6281 Muscle weakness (generalized): Secondary | ICD-10-CM | POA: Diagnosis not present

## 2018-12-30 DIAGNOSIS — R600 Localized edema: Secondary | ICD-10-CM | POA: Diagnosis not present

## 2018-12-30 DIAGNOSIS — M25612 Stiffness of left shoulder, not elsewhere classified: Secondary | ICD-10-CM

## 2018-12-30 DIAGNOSIS — R29898 Other symptoms and signs involving the musculoskeletal system: Secondary | ICD-10-CM

## 2018-12-30 DIAGNOSIS — M24112 Other articular cartilage disorders, left shoulder: Secondary | ICD-10-CM | POA: Diagnosis not present

## 2018-12-30 DIAGNOSIS — M25512 Pain in left shoulder: Secondary | ICD-10-CM | POA: Diagnosis not present

## 2018-12-30 NOTE — Patient Instructions (Signed)
Access Code: [email or web address]  URL: [email or web address]  Date: 12/30/2018  Prepared by: Rudell Cobb   Exercises Supine Shoulder Flexion Extension AAROM with Dowel - 10 reps - 3 sets - 3-5 hold - 3x daily - 7x weekly Supine Shoulder Abduction AAROM with Dowel - 10 reps - 3 sets - 3-5 hold - 3x daily - 7x weekly Supine Shoulder External Rotation AAROM with Dowel - 10 reps - 3 sets - 1x daily - 7x weekly Standing Shoulder Abduction AAROM with Dowel - 10 reps - 3 sets - 3-5 sec hold - 2x daily - 7x weekly Single Arm Shoulder Extension with Anchored Resistance - 10 reps - 1 sets - 2x daily - 7x weekly Shoulder External Rotation with Anchored Resistance - 10 reps - 1 sets - 2x daily - 7x weekly Standing Shoulder Flexion with Posterior Anchored Resistance - 10 reps - 1 sets - 2x daily - 7x weekly Isometric Shoulder Abduction at Wall - 10 reps - 3 sets - 1x daily - 7x weekly

## 2018-12-30 NOTE — Therapy (Signed)
[place], [place], [address] Phone: [phone]   Fax:  [phone]  Physical Therapy Treatment  Patient Details  Name: Jerry Escobar MRN: [id number] Date of Birth: 05-31-1970 Referring Provider (PT): Dr. Griffin Basil   Encounter Date: 12/30/2018  PT End of Session - 12/30/18 1233    Visit Number  13    Number of Visits  24    Date for PT Re-Evaluation  02/07/19    PT Start Time  1150    PT Stop Time  1233    PT Time Calculation (min)  43 min    Activity Tolerance  Patient tolerated treatment well    Behavior During Therapy  [facility] for tasks assessed/performed       Past Medical History:  Diagnosis Date  . Hyperlipidemia   . Hypertension   . Seasonal allergies   . Transaminitis     Past Surgical History:  Procedure Laterality Date  . BICEPT TENODESIS Left 10/06/2018   Procedure: BICEPS TENODESIS;  Surgeon: Hiram Gash, MD;  Location: [place];  Service: Orthopedics;  Laterality: Left;  . HERNIA REPAIR    . KNEE ARTHROSCOPY    . ROTATOR CUFF REPAIR    . SHOULDER ARTHROSCOPY WITH ROTATOR CUFF REPAIR AND SUBACROMIAL DECOMPRESSION Left 10/06/2018   Procedure: LEFT SHOULDER ARTHROSCOPY WITH ROTATOR CUFF REPAIR AND SUBACROMIAL DECOMPRESSION, EXTENSIVE DEBRIDEMENT, BICEP TENODESIS;  Surgeon: Hiram Gash, MD;  Location: [place];  Service: Orthopedics;  Laterality: Left;  BLOCK    There were no vitals filed for this visit.  Subjective Assessment - 12/30/18 1158    Subjective  The patient reports he left his doctor's visit and he can begin more strengthening.    Pertinent History  L RCR,SAD; 10/06/18; Rt shoulder RCR ~ 6 yrs ago; Rt knee surgery - scope x 2 most recent ~ 12 yrs ago; HTN    Patient Stated Goals  get motion and strength back    Currently in Pain?  Yes    Pain Score  --   "just sore"   Pain Location  Shoulder    Pain Orientation  Left    Pain  Descriptors / Indicators  Sore    Pain Type  Surgical pain    Pain Onset  More than a month ago    Pain Frequency  Constant    Aggravating Factors   sleeping on it    Pain Relieving Factors  ice                       [facility] Adult PT Treatment/Exercise - 12/30/18 1200      Exercises   Exercises  Shoulder      Shoulder Exercises: Supine   Protraction  10 reps;Weights    Protraction Weight (lbs)  2    External Rotation  Strengthening;Left;10 reps;Weights    External Rotation Weight (lbs)  2      Shoulder Exercises: Prone   Retraction  AROM;Strengthening;Left;10 reps    Flexion  AROM;Strengthening;Left;10 reps    Extension  AROM;Strengthening;Left;10 reps    External Rotation  AAROM   PT helped support arm and patient ER within available ROM   Other Prone Exercises  Row x 10 reps prone no weight left UE      Shoulder Exercises: Standing   External Rotation  Strengthening;Left;Theraband    Theraband Level (Shoulder External Rotation)  Level 2 (Red)    Flexion  Strengthening;Left;10 reps;Theraband    Theraband Level (Shoulder Flexion)  Level 2 (Red)    Flexion Limitations  Also performed AROM with cues for scapular stabilization    ABduction  AROM;Left    ABduction Limitations  in scaption plane    Extension  Strengthening;Left;10 reps;Theraband    Theraband Level (Shoulder Extension)  Level 2 (Red)      Shoulder Exercises: Pulleys   Flexion  3 minutes    Scaption  3 minutes      Modalities   Modalities  Vasopneumatic      Vasopneumatic   Number Minutes Vasopneumatic   10 minutes    Vasopnuematic Location   Shoulder    Vasopneumatic Pressure  Low    Vasopneumatic Temperature   34 deg             PT Education - 12/30/18 1232    Education Details  HEP adding red theraband to increase strengthening.    Person(s) Educated  Patient    Methods  Explanation;Demonstration;Handout    Comprehension  Verbalized understanding;Returned demonstration           PT Long Term Goals - 12/12/18 1732      PT LONG TERM GOAL #1   Title  Ind with advanced HEP to improve function of LUE    Status  Partially Met      PT LONG TERM GOAL #2   Title  Increase AROM of Lt shoulder to [facility] to perform ADLS without difficulty    Status  On-going      PT LONG TERM GOAL #3   Title  Patient to report no Lt shoulder pain with functional activities using Lt UE    Status  On-going      PT LONG TERM GOAL #4   Title  improved left UE to 5/5 to normalize function    Status  On-going            Plan - 12/30/18 1601    Clinical Impression Statement  The patient is at 12 weeks in protocol and went to MD today.  He notes ready for further strengthening.  PT progressed strengthening in clinic and for HEP today.  Plan to continue progressing to patient tolerance.    PT Treatment/Interventions  Patient/family education;ADLs/Self Care Home Management;Cryotherapy;Electrical Stimulation;Moist Heat;Manual techniques;Neuromuscular re-education;Therapeutic exercise;Iontophoresis 60m/ml Dexamethasone;Vasopneumatic Device    PT Next Visit Plan  advance exercises per protocol and pt tolerance    PT Home Exercise Plan  [id number]   Consulted and Agree with Plan of Care  Patient       Patient will benefit from skilled therapeutic intervention in order to improve the following deficits and impairments:  Pain, Impaired UE functional use, Decreased strength, Decreased range of motion, Decreased activity tolerance  Visit Diagnosis: Stiffness of left shoulder, not elsewhere classified  Muscle weakness (generalized)  Acute pain of left shoulder  Localized edema  Abnormal posture  Other symptoms and signs involving the musculoskeletal system     Problem List Patient Active Problem List   Diagnosis Date Noted  . Erectile dysfunction 12/26/2018  . Numbness and tingling of left side of face 10/17/2018  . Rotator cuff tear, left 09/05/2018  . Acute left-sided  low back pain without sciatica 11/24/2016  . Right-sided chest wall pain 07/01/2016  . Eyelid abnormality 04/09/2016  . Hyperlipidemia 01/16/2014  . Transaminitis 01/16/2014  . Prediabetes 01/16/2014  . Annual physical exam 01/15/2014  . Essential hypertension, benign 01/15/2014  . Obesity 01/15/2014  .  Meralgia paresthetica of right side 11/27/2013  . Osteoarthritis of left hip 09/18/2013    [place] , PT 12/30/2018, 4:01 PM  [facility] [place], [place], [address] Phone: [phone]   Fax:  [phone]  Name: Jerry Escobar MRN: [id number] Date of Birth: December 29, 1970

## 2019-01-02 ENCOUNTER — Ambulatory Visit (INDEPENDENT_AMBULATORY_CARE_PROVIDER_SITE_OTHER): Payer: BC Managed Care – PPO | Admitting: Physical Therapy

## 2019-01-02 ENCOUNTER — Other Ambulatory Visit: Payer: Self-pay

## 2019-01-02 DIAGNOSIS — M6281 Muscle weakness (generalized): Secondary | ICD-10-CM | POA: Diagnosis not present

## 2019-01-02 DIAGNOSIS — R293 Abnormal posture: Secondary | ICD-10-CM

## 2019-01-02 DIAGNOSIS — M25512 Pain in left shoulder: Secondary | ICD-10-CM | POA: Diagnosis not present

## 2019-01-02 DIAGNOSIS — R600 Localized edema: Secondary | ICD-10-CM

## 2019-01-02 DIAGNOSIS — M25612 Stiffness of left shoulder, not elsewhere classified: Secondary | ICD-10-CM | POA: Diagnosis not present

## 2019-01-02 NOTE — Therapy (Signed)
[place], [place], [address] Phone: [phone]   Fax:  [phone]  Physical Therapy Treatment  Patient Details  Name: Jerry Escobar MRN: [id number] Date of Birth: 1970-03-23 Referring Provider (PT): Dr. Griffin Basil   Encounter Date: 01/02/2019  PT End of Session - 01/02/19 1653    Visit Number  14    Number of Visits  24    Date for PT Re-Evaluation  02/07/19    PT Start Time  1603    PT Stop Time  1649    PT Time Calculation (min)  46 min    Activity Tolerance  Patient tolerated treatment well    Behavior During Therapy  [facility] for tasks assessed/performed       Past Medical History:  Diagnosis Date  . Hyperlipidemia   . Hypertension   . Seasonal allergies   . Transaminitis     Past Surgical History:  Procedure Laterality Date  . BICEPT TENODESIS Left 10/06/2018   Procedure: BICEPS TENODESIS;  Surgeon: Hiram Gash, MD;  Location: [place];  Service: Orthopedics;  Laterality: Left;  . HERNIA REPAIR    . KNEE ARTHROSCOPY    . ROTATOR CUFF REPAIR    . SHOULDER ARTHROSCOPY WITH ROTATOR CUFF REPAIR AND SUBACROMIAL DECOMPRESSION Left 10/06/2018   Procedure: LEFT SHOULDER ARTHROSCOPY WITH ROTATOR CUFF REPAIR AND SUBACROMIAL DECOMPRESSION, EXTENSIVE DEBRIDEMENT, BICEP TENODESIS;  Surgeon: Hiram Gash, MD;  Location: [place];  Service: Orthopedics;  Laterality: Left;  BLOCK    There were no vitals filed for this visit.  Subjective Assessment - 01/02/19 1657    Subjective  Pt reports no new changes.  Returns to MD in 4 wks. Pt reports he will need to lift 50# for return to work.    Pertinent History  L RCR,SAD; 10/06/18; Rt shoulder RCR ~ 6 yrs ago; Rt knee surgery - scope x 2 most recent ~ 12 yrs ago; HTN    Patient Stated Goals  get motion and strength back    Currently in Pain?  No/denies   0/10 at rest; 3/10 with exercises.   Pain Location  Shoulder    Pain Orientation  Left;Anterior    Pain Onset  More than a month ago    Aggravating Factors   sleeping on it    Pain Relieving Factors  ice         [facility] PT Assessment - 01/02/19 0001      Assessment   Medical Diagnosis  Lt RCR, biceps tenodesis, SAD    Referring Provider (PT)  Dr. Griffin Basil    Onset Date/Surgical Date  10/06/18    Hand Dominance  Right    Next MD Visit  12/30/18    Prior Therapy  for right shoulder        [facility] Adult PT Treatment/Exercise - 01/02/19 0001      Elbow Exercises   Elbow Flexion  Left;Strengthening;10 reps;Seated   2 sets; red band, 5#     Shoulder Exercises: Supine   Horizontal ABduction  Strengthening;Both;10 reps    Theraband Level (Shoulder Horizontal ABduction)  Level 2 (Red)    External Rotation  Strengthening;Both;10 reps    Theraband Level (Shoulder External Rotation)  Level 2 (Red)    Diagonals  Strengthening;Left;10 reps    Theraband Level (Shoulder Diagonals)  Level 2 (Red)      Shoulder Exercises: Standing   External Rotation  Strengthening;Both;10 reps    Theraband Level (  Shoulder External Rotation)  Level 2 (Red)    Flexion  Strengthening;Left;Weights;5 reps   fatigued with 3#, switched to 2#    Shoulder Flexion Weight (lbs)  3, 2    Flexion Limitations  also performed OH press (simulating reaching top shelf) with 2# x 10     ABduction  Strengthening;Left;10 reps   scaption; mirror for feedback- to shoulder height   Shoulder ABduction Weight (lbs)  2    Row  Left;10 reps   3 sec pause in retraction   Theraband Level (Shoulder Row)  Level 2 (Red)      Shoulder Exercises: Pulleys   Flexion  2 minutes    Scaption  2 minutes      Shoulder Exercises: ROM/Strengthening   UBE (Upper Arm Bike)  L1: 1 min forward/ 1 min backward, standing       Shoulder Exercises: Stretch   Other Shoulder Stretches  mid-position doorway stretch x 20 sec x 2 reps, trial of high position x 15 sec, and overhead (limited range, stopped)       Vasopneumatic   Number Minutes Vasopneumatic   10 minutes    Vasopnuematic Location   Shoulder    Vasopneumatic Pressure  Low    Vasopneumatic Temperature   34 deg                  PT Long Term Goals - 12/12/18 1732      PT LONG TERM GOAL #1   Title  Ind with advanced HEP to improve function of LUE    Status  Partially Met      PT LONG TERM GOAL #2   Title  Increase AROM of Lt shoulder to [facility] to perform ADLS without difficulty    Status  On-going      PT LONG TERM GOAL #3   Title  Patient to report no Lt shoulder pain with functional activities using Lt UE    Status  On-going      PT LONG TERM GOAL #4   Title  improved left UE to 5/5 to normalize function    Status  On-going            Plan - 01/02/19 1648    Clinical Impression Statement  Pt continues to have limited end range ROM in Lt shoulder ER, flexion, scaption.  Pt tolerated new light strengthening without increase in pain, only fatigue. Pt progressing well within rehab protocol.    Rehab Potential  Excellent    PT Frequency  3x / week    PT Duration  6 weeks    PT Treatment/Interventions  Patient/family education;ADLs/Self Care Home Management;Cryotherapy;Electrical Stimulation;Moist Heat;Manual techniques;Neuromuscular re-education;Therapeutic exercise;Iontophoresis 23m/ml Dexamethasone;Vasopneumatic Device    PT Next Visit Plan  advance exercises per protocol and pt tolerance    PT Home Exercise Plan  [email or web address]   Consulted and Agree with Plan of Care  Patient       Patient will benefit from skilled therapeutic intervention in order to improve the following deficits and impairments:  Pain, Impaired UE functional use, Decreased strength, Decreased range of motion, Decreased activity tolerance  Visit Diagnosis: Stiffness of left shoulder, not elsewhere classified  Muscle weakness (generalized)  Acute pain of left shoulder  Localized edema  Abnormal posture     Problem List Patient  Active Problem List   Diagnosis Date Noted  . Erectile dysfunction 12/26/2018  . Numbness and tingling of left side of face 10/17/2018  . Rotator cuff tear, left  09/05/2018  . Acute left-sided low back pain without sciatica 11/24/2016  . Right-sided chest wall pain 07/01/2016  . Eyelid abnormality 04/09/2016  . Hyperlipidemia 01/16/2014  . Transaminitis 01/16/2014  . Prediabetes 01/16/2014  . Annual physical exam 01/15/2014  . Essential hypertension, benign 01/15/2014  . Obesity 01/15/2014  . Meralgia paresthetica of right side 11/27/2013  . Osteoarthritis of left hip 09/18/2013   Kerin Perna, PTA 01/02/19 4:58 PM  [place], [place], [address] Phone: [phone]   Fax:  [phone]  Name: Jerry Escobar MRN: [id number] Date of Birth: 1970/09/09

## 2019-01-02 NOTE — Patient Instructions (Signed)
Access Code: [email or web address]  URL: [email or web address]  Date: 01/02/2019  Prepared by: Kerin Perna   Exercises  Supine Shoulder Flexion Extension AAROM with Dowel - 10 reps - 3 sets - 3-5 hold - 3x daily - 7x weekly  Supine Shoulder Abduction AAROM with Dowel - 10 reps - 3 sets - 3-5 hold - 3x daily - 7x weekly  Supine Shoulder External Rotation AAROM with Dowel - 10 reps - 3 sets - 1x daily - 7x weekly  Single Arm Shoulder Extension with Anchored Resistance - 10 reps - 2 sets - 2x daily - 3x weekly  Standing Single Arm Low Row with Anchored Resistance - 10 reps - 2 sets - 2x daily - 3x weekly  Standing Shoulder Flexion with Posterior Anchored Resistance - 10 reps - 2 sets - 2x daily - 3x weekly  Supine Shoulder External Rotation with Resistance - 10 reps - 2 sets - 2x daily - 3x weekly  Supine Shoulder Horizontal Abduction with Resistance - 10 reps - 2 sets - 2x daily - 3x weekly  Supine PNF D2 Flexion with Resistance - 10 reps - 2 sets - 2x daily - 3x weekly

## 2019-01-09 ENCOUNTER — Other Ambulatory Visit: Payer: Self-pay | Admitting: Sports Medicine

## 2019-01-10 ENCOUNTER — Encounter: Payer: Self-pay | Admitting: Rehabilitative and Restorative Service Providers"

## 2019-01-10 ENCOUNTER — Other Ambulatory Visit: Payer: Self-pay

## 2019-01-10 ENCOUNTER — Ambulatory Visit (INDEPENDENT_AMBULATORY_CARE_PROVIDER_SITE_OTHER): Payer: BC Managed Care – PPO | Admitting: Rehabilitative and Restorative Service Providers"

## 2019-01-10 DIAGNOSIS — R600 Localized edema: Secondary | ICD-10-CM | POA: Diagnosis not present

## 2019-01-10 DIAGNOSIS — M25512 Pain in left shoulder: Secondary | ICD-10-CM

## 2019-01-10 DIAGNOSIS — M25612 Stiffness of left shoulder, not elsewhere classified: Secondary | ICD-10-CM

## 2019-01-10 DIAGNOSIS — R293 Abnormal posture: Secondary | ICD-10-CM

## 2019-01-10 DIAGNOSIS — R29898 Other symptoms and signs involving the musculoskeletal system: Secondary | ICD-10-CM

## 2019-01-10 DIAGNOSIS — M6281 Muscle weakness (generalized): Secondary | ICD-10-CM | POA: Diagnosis not present

## 2019-01-10 NOTE — Therapy (Signed)
[place], [place], [address] Phone: [phone]   Fax:  [phone]  Physical Therapy Treatment  Patient Details  Name: Jerry Escobar MRN: [id number] Date of Birth: 06-13-1970 Referring Provider (PT): Dr. Griffin Basil   Encounter Date: 01/10/2019  PT End of Session - 01/10/19 1226    Visit Number  15    Number of Visits  24    Date for PT Re-Evaluation  02/07/19    PT Start Time  0900    PT Stop Time  0940    PT Time Calculation (min)  40 min    Activity Tolerance  Patient tolerated treatment well    Behavior During Therapy  [facility] for tasks assessed/performed       Past Medical History:  Diagnosis Date  . Hyperlipidemia   . Hypertension   . Seasonal allergies   . Transaminitis     Past Surgical History:  Procedure Laterality Date  . BICEPT TENODESIS Left 10/06/2018   Procedure: BICEPS TENODESIS;  Surgeon: Hiram Gash, MD;  Location: [place];  Service: Orthopedics;  Laterality: Left;  . HERNIA REPAIR    . KNEE ARTHROSCOPY    . ROTATOR CUFF REPAIR    . SHOULDER ARTHROSCOPY WITH ROTATOR CUFF REPAIR AND SUBACROMIAL DECOMPRESSION Left 10/06/2018   Procedure: LEFT SHOULDER ARTHROSCOPY WITH ROTATOR CUFF REPAIR AND SUBACROMIAL DECOMPRESSION, EXTENSIVE DEBRIDEMENT, BICEP TENODESIS;  Surgeon: Hiram Gash, MD;  Location: [place];  Service: Orthopedics;  Laterality: Left;  BLOCK    There were no vitals filed for this visit.  Subjective Assessment - 01/10/19 0859    Subjective  The patient has increased soreness, but no pain.  He reports he has been using it more in day to day activities and thinks this accounts for the soreness.    Pertinent History  L RCR,SAD; 10/06/18; Rt shoulder RCR ~ 6 yrs ago; Rt knee surgery - scope x 2 most recent ~ 12 yrs ago; HTN    Patient Stated Goals  get motion and strength back    Currently in Pain?  Yes    Pain Score  6     Pain  Location  Shoulder    Pain Descriptors / Indicators  Sore    Pain Type  Surgical pain    Pain Onset  More than a month ago    Pain Frequency  Intermittent    Aggravating Factors   increased use    Pain Relieving Factors  ice                       [facility] Adult PT Treatment/Exercise - 01/10/19 0901      Exercises   Exercises  Shoulder      Shoulder Exercises: Standing   External Rotation  Strengthening;Both;12 reps    External Rotation Limitations  ball against wall for thoracic extension into W position    ABduction  Strengthening;Left;12 reps    Shoulder ABduction Weight (lbs)  isometrics    ABduction Limitations  into door frame to engage middle deltoid; then progressed to 2 lb weight with elbows flexed to 90 degrees performing abduction    Extension  Strengthening;Left;12 reps    Theraband Level (Shoulder Extension)  Level 2 (Red)    Retraction  Strengthening;Both;12 reps    Retraction Limitations  beginning with arms at 90 degrees flexion moving into "T" for horizontal abduction x 12 reps    Other Standing Exercises  scaption red band x 12 reps    Other Standing Exercises  facing away from door pull downs (from 90 degrees flexion to neutral) x 10 reps, press downs into red theraband x 10 reps      Shoulder Exercises: Therapy Ball   Flexion  Both;10 reps    Flexion Limitations  end range stretch then added lateral rolling    ABduction  Left;10 reps      Shoulder Exercises: Body Blade   Flexion  60 seconds    Flexion Limitations  with left elbow flexed to 90; then flexion to 90 degrees x 30 seconds with arm extended    Other Body Blade Exercises  At neutral x 1 minute L UE      Modalities   Modalities  Vasopneumatic      Vasopneumatic   Number Minutes Vasopneumatic   10 minutes    Vasopnuematic Location   Shoulder    Vasopneumatic Pressure  Low    Vasopneumatic Temperature   34 deg                  PT Long Term Goals - 01/10/19 1229      PT  LONG TERM GOAL #1   Title  Ind with advanced HEP to improve function of LUE    Status  Partially Met    Target Date  02/07/19      PT LONG TERM GOAL #2   Title  Increase AROM of Lt shoulder to [facility] to perform ADLS without difficulty    Status  On-going      PT LONG TERM GOAL #3   Title  Patient to report no Lt shoulder pain with functional activities using Lt UE    Status  On-going      PT LONG TERM GOAL #4   Title  improved left UE to 5/5 to normalize function    Status  On-going            Plan - 01/10/19 1229    Clinical Impression Statement  The patient notes pain with abduction in mid range of movement.  He also has shoulder elevation during abduction.  PT continuing to emphasize normal scapulothoracic rhythm, strengthening, and functional use of the L Ue.    PT Treatment/Interventions  Patient/family education;ADLs/Self Care Home Management;Cryotherapy;Electrical Stimulation;Moist Heat;Manual techniques;Neuromuscular re-education;Therapeutic exercise;Iontophoresis 62m/ml Dexamethasone;Vasopneumatic Device    PT Next Visit Plan  advance exercises per protocol and pt tolerance    PT Home Exercise Plan  [email or web address]   Consulted and Agree with Plan of Care  Patient       Patient will benefit from skilled therapeutic intervention in order to improve the following deficits and impairments:  Pain, Impaired UE functional use, Decreased strength, Decreased range of motion, Decreased activity tolerance  Visit Diagnosis: Stiffness of left shoulder, not elsewhere classified  Muscle weakness (generalized)  Acute pain of left shoulder  Localized edema  Abnormal posture  Other symptoms and signs involving the musculoskeletal system     Problem List Patient Active Problem List   Diagnosis Date Noted  . Erectile dysfunction 12/26/2018  . Numbness and tingling of left side of face 10/17/2018  . Rotator cuff tear, left 09/05/2018  . Acute left-sided low back pain without  sciatica 11/24/2016  . Right-sided chest wall pain 07/01/2016  . Eyelid abnormality 04/09/2016  . Hyperlipidemia 01/16/2014  . Transaminitis 01/16/2014  . Prediabetes 01/16/2014  . Annual physical exam 01/15/2014  . Essential hypertension, benign 01/15/2014  .  Obesity 01/15/2014  . Meralgia paresthetica of right side 11/27/2013  . Osteoarthritis of left hip 09/18/2013    Mehar Kirkwood , PT 01/10/2019, 12:31 PM  [facility] [place], [place], [address] Phone: [phone]   Fax:  [phone]  Name: Jerry Escobar MRN: [id number] Date of Birth: 11-20-1970

## 2019-01-12 ENCOUNTER — Other Ambulatory Visit: Payer: Self-pay

## 2019-01-12 ENCOUNTER — Ambulatory Visit (INDEPENDENT_AMBULATORY_CARE_PROVIDER_SITE_OTHER): Payer: BC Managed Care – PPO | Admitting: Rehabilitative and Restorative Service Providers"

## 2019-01-12 ENCOUNTER — Encounter: Payer: Self-pay | Admitting: Rehabilitative and Restorative Service Providers"

## 2019-01-12 DIAGNOSIS — M6281 Muscle weakness (generalized): Secondary | ICD-10-CM

## 2019-01-12 DIAGNOSIS — R293 Abnormal posture: Secondary | ICD-10-CM

## 2019-01-12 DIAGNOSIS — R600 Localized edema: Secondary | ICD-10-CM

## 2019-01-12 DIAGNOSIS — M25512 Pain in left shoulder: Secondary | ICD-10-CM

## 2019-01-12 DIAGNOSIS — M25612 Stiffness of left shoulder, not elsewhere classified: Secondary | ICD-10-CM

## 2019-01-12 DIAGNOSIS — R29898 Other symptoms and signs involving the musculoskeletal system: Secondary | ICD-10-CM

## 2019-01-12 NOTE — Therapy (Signed)
[place], [place], [address] Phone: [phone]   Fax:  [phone]  Physical Therapy Treatment  Patient Details  Name: Jerry Escobar MRN: [id number] Date of Birth: 28-Jan-1970 Referring Provider (PT): Dr. Griffin Basil   Encounter Date: 01/12/2019  PT End of Session - 01/12/19 1245    Visit Number  16    Number of Visits  24    Date for PT Re-Evaluation  02/07/19    PT Start Time  0853    PT Stop Time  0938    PT Time Calculation (min)  45 min    Activity Tolerance  Patient tolerated treatment well    Behavior During Therapy  [facility] for tasks assessed/performed       Past Medical History:  Diagnosis Date  . Hyperlipidemia   . Hypertension   . Seasonal allergies   . Transaminitis     Past Surgical History:  Procedure Laterality Date  . BICEPT TENODESIS Left 10/06/2018   Procedure: BICEPS TENODESIS;  Surgeon: Hiram Gash, MD;  Location: [place];  Service: Orthopedics;  Laterality: Left;  . HERNIA REPAIR    . KNEE ARTHROSCOPY    . ROTATOR CUFF REPAIR    . SHOULDER ARTHROSCOPY WITH ROTATOR CUFF REPAIR AND SUBACROMIAL DECOMPRESSION Left 10/06/2018   Procedure: LEFT SHOULDER ARTHROSCOPY WITH ROTATOR CUFF REPAIR AND SUBACROMIAL DECOMPRESSION, EXTENSIVE DEBRIDEMENT, BICEP TENODESIS;  Surgeon: Hiram Gash, MD;  Location: [place];  Service: Orthopedics;  Laterality: Left;  BLOCK    There were no vitals filed for this visit.  Subjective Assessment - 01/12/19 0855    Subjective  The patient reports soreness, but no pain.  He notes his arm is doing well.    Pertinent History  L RCR,SAD; 10/06/18; Rt shoulder RCR ~ 6 yrs ago; Rt knee surgery - scope x 2 most recent ~ 12 yrs ago; HTN    Patient Stated Goals  get motion and strength back    Currently in Pain?  No/denies         [facility] PT Assessment - 01/12/19 0001      AROM   Left Shoulder Flexion  140 Degrees    Left Shoulder ABduction  134 Degrees                   [facility] Adult PT Treatment/Exercise - 01/12/19 0856      Exercises   Exercises  Shoulder      Shoulder Exercises: Supine   Horizontal ABduction  Strengthening;Both    Theraband Level (Shoulder Horizontal ABduction)  Level 2 (Red)    External Rotation  AROM;PROM;Left;5 reps    External Rotation Limitations  end range isometrics     Flexion  AROM;PROM;Left;10 reps    Flexion Limitations  passive overpressure at end range    ABduction  AROM;PROM;Left;10 reps    ABduction Limitations  passive ROM at end range    Diagonals  Strengthening;Left;10 reps    Theraband Level (Shoulder Diagonals)  Level 2 (Red)    Diagonals Limitations  D1 and D2 supine      Shoulder Exercises: Standing   Protraction  Strengthening;Both;12 reps    Protraction Limitations  elbow leaning on wall with protraction/retraction    External Rotation  Strengthening;Left;10 reps    External Rotation Limitations  W exercise x 5 reps    Flexion  Strengthening    Flexion Limitations  standing with arms overhead lifting off into end range  flexion x 10 reps alternating    ABduction  Strengthening    Shoulder ABduction Weight (lbs)  isometrics     ABduction Limitations  into door frame to engage middle deltoid; then progressed to 2 lb weight with elbows flexed to 90 degrees performing abduction    Extension  Strengthening;Left;12 reps    Retraction  Strengthening;Both;12 reps    Retraction Limitations  beginning with arms at 90 degrees flexion moving into "T" for horizontal abduction x 12 reps    Diagonals  12 reps;Strengthening;Left    Other Standing Exercises  Rolling physioball x 5 reps x 2 sets into flexion and abduction for stretching between ther ex      Shoulder Exercises: Pulleys   Flexion  2 minutes    Scaption  2 minutes      Shoulder Exercises: Therapy Ball   Flexion  Both      Modalities   Modalities  Vasopneumatic      Vasopneumatic    Number Minutes Vasopneumatic   10 minutes    Vasopnuematic Location   Shoulder    Vasopneumatic Pressure  Low    Vasopneumatic Temperature   34 deg                  PT Long Term Goals - 01/10/19 1229      PT LONG TERM GOAL #1   Title  Ind with advanced HEP to improve function of LUE    Status  Partially Met    Target Date  02/07/19      PT LONG TERM GOAL #2   Title  Increase AROM of Lt shoulder to [facility] to perform ADLS without difficulty    Status  On-going      PT LONG TERM GOAL #3   Title  Patient to report no Lt shoulder pain with functional activities using Lt UE    Status  On-going      PT LONG TERM GOAL #4   Title  improved left UE to 5/5 to normalize function    Status  On-going            Plan - 01/12/19 1248    Clinical Impression Statement  The patient feels movement improving this week.  PT focusing on continued progression of strengthening for functional use.    PT Treatment/Interventions  Patient/family education;ADLs/Self Care Home Management;Cryotherapy;Electrical Stimulation;Moist Heat;Manual techniques;Neuromuscular re-education;Therapeutic exercise;Iontophoresis 64m/ml Dexamethasone;Vasopneumatic Device    PT Next Visit Plan  advance exercises per protocol and pt tolerance    PT Home Exercise Plan  [email or web address]   Consulted and Agree with Plan of Care  Patient       Patient will benefit from skilled therapeutic intervention in order to improve the following deficits and impairments:  Pain, Impaired UE functional use, Decreased strength, Decreased range of motion, Decreased activity tolerance  Visit Diagnosis: Stiffness of left shoulder, not elsewhere classified  Muscle weakness (generalized)  Acute pain of left shoulder  Abnormal posture  Localized edema  Other symptoms and signs involving the musculoskeletal system     Problem List Patient Active Problem List   Diagnosis Date Noted  . Erectile dysfunction 12/26/2018  . Numbness  and tingling of left side of face 10/17/2018  . Rotator cuff tear, left 09/05/2018  . Acute left-sided low back pain without sciatica 11/24/2016  . Right-sided chest wall pain 07/01/2016  . Eyelid abnormality 04/09/2016  . Hyperlipidemia 01/16/2014  . Transaminitis 01/16/2014  . Prediabetes 01/16/2014  . Annual physical exam  01/15/2014  . Essential hypertension, benign 01/15/2014  . Obesity 01/15/2014  . Meralgia paresthetica of right side 11/27/2013  . Osteoarthritis of left hip 09/18/2013    Ronit Cranfield, PT 01/12/2019, 12:49 PM  [facility] [place], [place], [address] Phone: [phone]   Fax:  [phone]  Name: Tamara Kenyon MRN: [id number] Date of Birth: Feb 22, 1970

## 2019-01-16 ENCOUNTER — Other Ambulatory Visit: Payer: Self-pay

## 2019-01-16 ENCOUNTER — Ambulatory Visit (INDEPENDENT_AMBULATORY_CARE_PROVIDER_SITE_OTHER): Payer: BC Managed Care – PPO | Admitting: Physical Therapy

## 2019-01-16 ENCOUNTER — Encounter: Payer: Self-pay | Admitting: Physical Therapy

## 2019-01-16 DIAGNOSIS — M25512 Pain in left shoulder: Secondary | ICD-10-CM

## 2019-01-16 DIAGNOSIS — R293 Abnormal posture: Secondary | ICD-10-CM | POA: Diagnosis not present

## 2019-01-16 DIAGNOSIS — M6281 Muscle weakness (generalized): Secondary | ICD-10-CM

## 2019-01-16 DIAGNOSIS — M25612 Stiffness of left shoulder, not elsewhere classified: Secondary | ICD-10-CM | POA: Diagnosis not present

## 2019-01-16 DIAGNOSIS — R600 Localized edema: Secondary | ICD-10-CM

## 2019-01-16 NOTE — Therapy (Signed)
[place], [place], [address] Phone: [phone]   Fax:  [phone]  Physical Therapy Treatment  Patient Details  Name: Jerry Escobar MRN: [id number] Date of Birth: [birth year] Referring Provider (PT): Dr. Griffin Basil   Encounter Date: 01/16/2019  PT End of Session - 01/16/19 0942    Visit Number  17    Number of Visits  24    Date for PT Re-Evaluation  02/07/19    PT Start Time  0941   pt arrived late   PT Stop Time  1015    PT Time Calculation (min)  34 min    Activity Tolerance  Patient tolerated treatment well    Behavior During Therapy  [facility] for tasks assessed/performed       Past Medical History:  Diagnosis Date  . Hyperlipidemia   . Hypertension   . Seasonal allergies   . Transaminitis     Past Surgical History:  Procedure Laterality Date  . BICEPT TENODESIS Left 10/06/2018   Procedure: BICEPS TENODESIS;  Surgeon: Hiram Gash, MD;  Location: [place];  Service: Orthopedics;  Laterality: Left;  . HERNIA REPAIR    . KNEE ARTHROSCOPY    . ROTATOR CUFF REPAIR    . SHOULDER ARTHROSCOPY WITH ROTATOR CUFF REPAIR AND SUBACROMIAL DECOMPRESSION Left 10/06/2018   Procedure: LEFT SHOULDER ARTHROSCOPY WITH ROTATOR CUFF REPAIR AND SUBACROMIAL DECOMPRESSION, EXTENSIVE DEBRIDEMENT, BICEP TENODESIS;  Surgeon: Hiram Gash, MD;  Location: [place];  Service: Orthopedics;  Laterality: Left;  BLOCK    There were no vitals filed for this visit.  Subjective Assessment - 01/16/19 0943    Subjective  Pt reports he slept on his Lt shoulder; sore today.  Still has pain with reaching arm out to side.    Pertinent History  L RCR,SAD; 10/06/18; Rt shoulder RCR ~ 6 yrs ago; Rt knee surgery - scope x 2 most recent ~ 12 yrs ago; HTN    Currently in Pain?  Yes    Pain Score  5     Pain Location  Shoulder    Pain Orientation  Left;Proximal;Upper    Pain Descriptors / Indicators   Sore    Aggravating Factors   sleeping on shoulder; abduction exercises.    Pain Relieving Factors  ice         [facility] PT Assessment - 01/16/19 0001      Assessment   Medical Diagnosis  Lt RCR, biceps tenodesis, SAD    Referring Provider (PT)  Dr. Griffin Basil    Onset Date/Surgical Date  10/06/18    Hand Dominance  Right    Next MD Visit  01/27/19    Prior Therapy  for right shoulder       [facility] Adult PT Treatment/Exercise - 01/16/19 0001      Shoulder Exercises: Standing   Row  Both;10 reps    Theraband Level (Shoulder Row)  Level 3 (Green)    Other Standing Exercises  back of Lt hand on ball (arm abdct/ ER) x 5 sec isometrics x 5 reps, then CW/CCW x 1 min;  box lift 17# (waist height) x 1; box lift(27#) and carry 10 ft x 1 rep    Other Standing Exercises  bilat bicep with 6# x 15 reps,  overhead press with red band x 3 reps, yellow band x 3 reps (challenging).  Lt bicep curl with 10# x 5 reps;  Lt tricep kick back with green band  x 10 reps       Shoulder Exercises: ROM/Strengthening   UBE (Upper Arm Bike)  L2-4: 2 min forward, 1 min backward.       Shoulder Exercises: Stretch   Other Shoulder Stretches  Lt/Rt wrist ext stretch x 20 sec each    Other Shoulder Stretches  mid-position doorway stretch x 20 sec x 2 reps,  High level, unilateral x 20 sec x 2 reps (and 1 rep on RUE for comparison); bilat bicep stretch x 15 sec (holding door frame)       Modalities   Modalities  --   held due to time constraints; will ice at home       PT Long Term Goals - 01/10/19 1229      PT LONG TERM GOAL #1   Title  Ind with advanced HEP to improve function of LUE    Status  Partially Met    Target Date  02/07/19      PT LONG TERM GOAL #2   Title  Increase AROM of Lt shoulder to [facility] to perform ADLS without difficulty    Status  On-going      PT LONG TERM GOAL #3   Title  Patient to report no Lt shoulder pain with functional activities using Lt UE    Status  On-going      PT LONG TERM  GOAL #4   Title  improved left UE to 5/5 to normalize function    Status  On-going            Plan - 01/16/19 1017    Clinical Impression Statement  Pt continues to be challenged with AROM and resisted Lt shoulder abdct as well as ER.  Trialed box lift for simulating return to work activities, without difficulty or symptoms.  Pt felt good stretch in lat and pec with high position doorway; added verbally to HEP.  Progressing gradually towards remaining goals.    Examination-Activity Limitations  Carry;Lift;Reach Overhead    Rehab Potential  Excellent    PT Frequency  3x / week    PT Duration  6 weeks    PT Treatment/Interventions  Patient/family education;ADLs/Self Care Home Management;Cryotherapy;Electrical Stimulation;Moist Heat;Manual techniques;Neuromuscular re-education;Therapeutic exercise;Iontophoresis 38m/ml Dexamethasone;Vasopneumatic Device    PT Next Visit Plan  advance exercises per protocol and pt tolerance; add work simulated exercises.    PT Home Exercise Plan  [id number]   Consulted and Agree with Plan of Care  Patient       Patient will benefit from skilled therapeutic intervention in order to improve the following deficits and impairments:  Pain, Impaired UE functional use, Decreased strength, Decreased range of motion, Decreased activity tolerance  Visit Diagnosis: Stiffness of left shoulder, not elsewhere classified  Muscle weakness (generalized)  Acute pain of left shoulder  Abnormal posture  Localized edema     Problem List Patient Active Problem List   Diagnosis Date Noted  . Erectile dysfunction 12/26/2018  . Numbness and tingling of left side of face 10/17/2018  . Rotator cuff tear, left 09/05/2018  . Acute left-sided low back pain without sciatica 11/24/2016  . Right-sided chest wall pain 07/01/2016  . Eyelid abnormality 04/09/2016  . Hyperlipidemia 01/16/2014  . Transaminitis 01/16/2014  . Prediabetes 01/16/2014  . Annual physical exam  01/15/2014  . Essential hypertension, benign 01/15/2014  . Obesity 01/15/2014  . Meralgia paresthetica of right side 11/27/2013  . Osteoarthritis of left hip 09/18/2013   JKerin Perna PTA 01/16/19 1:13 PM  [place], [place], [address] Phone: [phone]   Fax:  [phone]  Name: Jerry Escobar MRN: [id number] Date of Birth: Dec 18, 1970

## 2019-01-19 ENCOUNTER — Other Ambulatory Visit: Payer: Self-pay

## 2019-01-19 ENCOUNTER — Ambulatory Visit (INDEPENDENT_AMBULATORY_CARE_PROVIDER_SITE_OTHER): Payer: BC Managed Care – PPO | Admitting: Physical Therapy

## 2019-01-19 ENCOUNTER — Encounter: Payer: Self-pay | Admitting: Physical Therapy

## 2019-01-19 DIAGNOSIS — M6281 Muscle weakness (generalized): Secondary | ICD-10-CM | POA: Diagnosis not present

## 2019-01-19 DIAGNOSIS — M25612 Stiffness of left shoulder, not elsewhere classified: Secondary | ICD-10-CM

## 2019-01-19 DIAGNOSIS — M25512 Pain in left shoulder: Secondary | ICD-10-CM | POA: Diagnosis not present

## 2019-01-19 DIAGNOSIS — R293 Abnormal posture: Secondary | ICD-10-CM

## 2019-01-19 NOTE — Therapy (Addendum)
[place], [place], [address] Phone: [phone]   Fax:  [phone]  Physical Therapy Treatment and Discharge Summary  Patient Details  Name: Jerry Escobar MRN: [id number] Date of Birth: 08-Oct-1970 Referring Provider (PT): Dr. Griffin Basil   Encounter Date: 01/19/2019  PT End of Session - 01/19/19 0939    Visit Number  18    Number of Visits  24    Date for PT Re-Evaluation  02/07/19    PT Start Time  7353   pt arrived late   PT Stop Time  1011    PT Time Calculation (min)  34 min    Activity Tolerance  Patient tolerated treatment well;No increased pain    Behavior During Therapy  WFL for tasks assessed/performed       Past Medical History:  Diagnosis Date  . Hyperlipidemia   . Hypertension   . Seasonal allergies   . Transaminitis     Past Surgical History:  Procedure Laterality Date  . BICEPT TENODESIS Left 10/06/2018   Procedure: BICEPS TENODESIS;  Surgeon: Hiram Gash, MD;  Location: [facility];  Service: Orthopedics;  Laterality: Left;  . HERNIA REPAIR    . KNEE ARTHROSCOPY    . ROTATOR CUFF REPAIR    . SHOULDER ARTHROSCOPY WITH ROTATOR CUFF REPAIR AND SUBACROMIAL DECOMPRESSION Left 10/06/2018   Procedure: LEFT SHOULDER ARTHROSCOPY WITH ROTATOR CUFF REPAIR AND SUBACROMIAL DECOMPRESSION, EXTENSIVE DEBRIDEMENT, BICEP TENODESIS;  Surgeon: Hiram Gash, MD;  Location: [facility];  Service: Orthopedics;  Laterality: Left;  BLOCK    There were no vitals filed for this visit.  Subjective Assessment - 01/19/19 0939    Subjective  Pt reports he is getting a better stretch to shoulder with new exercise.  He reports occasional popping sensation in Lt shoulder, happening more the couple days (on top of Lt shoulder); sometimes relief, sometimes with increased pain.    Currently in Pain?  No/denies    Pain Score  0-No pain    Pain Location  Shoulder    Pain  Orientation  Left         [facility] PT Assessment - 01/19/19 0001      Assessment   Medical Diagnosis  Lt RCR, biceps tenodesis, SAD    Referring Provider (PT)  Dr. Griffin Basil    Onset Date/Surgical Date  10/06/18    Hand Dominance  Right    Next MD Visit  01/27/19    Prior Therapy  for right shoulder      Strength   Right Hand Grip (lbs)  130    Left Hand Grip (lbs)  114       [facility] Adult PT Treatment/Exercise - 01/19/19 0001      Shoulder Exercises: Standing   External Rotation  Strengthening;Left;10 reps   reactive isometric    Theraband Level (Shoulder External Rotation)  Level 2 (Red)    Row  Strengthening;Both;10 reps    Theraband Level (Shoulder Row)  --   black band, 2 sets    Other Standing Exercises  sled push/pull 40 ft x 3 reps both directions with 50# on sled; wall push up (hands on back of truck) x 10 reps    Other Standing Exercises  bilat bicep curl with 7# x 2 sets of 10.  Tricep pull down with 2 plates x 2 sets of 10       Shoulder Exercises: ROM/Strengthening   UBE (Upper Arm Bike)  L4: 4 min total, alternating forward/ backward      Shoulder Exercises: Stretch   Cross Chest Stretch  2 reps;20 seconds    Other Shoulder Stretches  Lt wrist ext stretch x 20 sec each; bilat bicep stretch x 20 sec x 2 reps    Other Shoulder Stretches  unilateral mid-level door stretch x 20 sec x 2 reps;  high level stretch x 20 sec x 2 reps      Hand Exercises   Other Hand Exercises  large clamp grip on Lt hand x 10 reps     Pt to do self massage with ball and ice shoulder after he returns home today; pt verbalized understanding.     PT Long Term Goals - 01/10/19 1229      PT LONG TERM GOAL #1   Title  Ind with advanced HEP to improve function of LUE    Status  Partially Met    Target Date  02/07/19      PT LONG TERM GOAL #2   Title  Increase AROM of Lt shoulder to [facility] to perform ADLS without difficulty    Status  On-going      PT LONG TERM GOAL #3   Title  Patient to  report no Lt shoulder pain with functional activities using Lt UE    Status  On-going      PT LONG TERM GOAL #4   Title  improved left UE to 5/5 to normalize function    Status  On-going            Plan - 01/19/19 1012    Clinical Impression Statement  Pt has had good response to modifying high door position stretch, now feeling it more in pec and lat.  Pt tolerated new resistive exercises without any difficulty or pain; added more job related simulations (sled push/pull).  Pt's Lt grip strength has improved from 83# to 114# (15# less than Rt hand).  Pt progressing gradually towards remaining goals.  Returns to MD 01/27/19.    Examination-Activity Limitations  Carry;Lift;Reach Overhead    Rehab Potential  Excellent    PT Frequency  3x / week    PT Duration  6 weeks    PT Treatment/Interventions  Patient/family education;ADLs/Self Care Home Management;Cryotherapy;Electrical Stimulation;Moist Heat;Manual techniques;Neuromuscular re-education;Therapeutic exercise;Iontophoresis 71m/ml Dexamethasone;Vasopneumatic Device    PT Next Visit Plan  advance exercises per protocol and pt tolerance; add work simulated exercises.  MD note.    PT Home Exercise Plan  [id number]   Consulted and Agree with Plan of Care  Patient       Patient will benefit from skilled therapeutic intervention in order to improve the following deficits and impairments:  Pain, Impaired UE functional use, Decreased strength, Decreased range of motion, Decreased activity tolerance  Visit Diagnosis: Stiffness of left shoulder, not elsewhere classified  Muscle weakness (generalized)  Acute pain of left shoulder  Abnormal posture     Problem List Patient Active Problem List   Diagnosis Date Noted  . Erectile dysfunction 12/26/2018  . Numbness and tingling of left side of face 10/17/2018  . Rotator cuff tear, left 09/05/2018  . Acute left-sided low back pain without sciatica 11/24/2016  . Right-sided chest wall pain  07/01/2016  . Eyelid abnormality 04/09/2016  . Hyperlipidemia 01/16/2014  . Transaminitis 01/16/2014  . Prediabetes 01/16/2014  . Annual physical exam 01/15/2014  . Essential hypertension, benign 01/15/2014  . Obesity 01/15/2014  . Meralgia paresthetica of right side 11/27/2013  .  Osteoarthritis of left hip 09/18/2013   Kerin Perna, PTA 01/19/19 10:16 AM   [place], [place], [address] Phone: [phone]   Fax:  [phone]  Name: Jerry Escobar MRN: [id number] Date of Birth: 01-06-1971  PHYSICAL THERAPY DISCHARGE SUMMARY  Visits from Start of Care: 18  Current functional level related to goals / functional outcomes: See above    Remaining deficits: See above    Education / Equipment: HEP  Plan: Patient agrees to discharge.  Patient goals were partially met. Patient is being discharged due to not returning since the last visit.  ?????    Madelyn Flavors, PT 02/22/19 7:56 AM  [facility] at [place], [place] [address]  [phone] (office) [phone] (fax)

## 2019-01-23 ENCOUNTER — Encounter: Payer: BC Managed Care – PPO | Admitting: Physical Therapy

## 2019-01-23 ENCOUNTER — Ambulatory Visit (INDEPENDENT_AMBULATORY_CARE_PROVIDER_SITE_OTHER): Payer: BC Managed Care – PPO | Admitting: Sports Medicine

## 2019-01-23 ENCOUNTER — Encounter: Payer: Self-pay | Admitting: Sports Medicine

## 2019-01-23 ENCOUNTER — Telehealth: Payer: Self-pay | Admitting: Physical Therapy

## 2019-01-23 DIAGNOSIS — N529 Male erectile dysfunction, unspecified: Secondary | ICD-10-CM | POA: Diagnosis not present

## 2019-01-23 DIAGNOSIS — Z0000 Encounter for general adult medical examination without abnormal findings: Secondary | ICD-10-CM

## 2019-01-23 DIAGNOSIS — I10 Essential (primary) hypertension: Secondary | ICD-10-CM | POA: Diagnosis not present

## 2019-01-23 MED ORDER — VALSARTAN-HYDROCHLOROTHIAZIDE 320-25 MG PO TABS: 1.0000 | ORAL_TABLET | Freq: Every day | ORAL | 3 refills | Status: DC

## 2019-01-23 NOTE — Assessment & Plan Note (Signed)
Harvis needs to return for a physical, his last physical was 3 years ago.

## 2019-01-23 NOTE — Telephone Encounter (Signed)
Patient did not show for PT appt.  Called and left HIPAA compliant voice mail for him to return phone call to [facility] at [phone].  Kerin Perna, PTA 01/23/19 2:50 PM

## 2019-01-23 NOTE — Assessment & Plan Note (Signed)
Exquisitely well controlled now with Cialis and good Rx coupon. No changes here.

## 2019-01-23 NOTE — Progress Notes (Signed)
    Procedures performed today:    None.  Independent interpretation of tests performed by another provider:   None.  Impression and Recommendations:    I have performed independent interpretation of the relevant labs and imaging ordered by this patient's other providers.  Essential hypertension, benign Shadi's blood pressure is well controlled. Unfortunately he has developed a cough which is a side effect of treatment. Switching to valsartan/HCTZ. Otherwise continue current medications.  Erectile dysfunction Exquisitely well controlled now with Cialis and good Rx coupon. No changes here.  Annual physical exam Danial needs to return for a physical, his last physical was 3 years ago.    ___________________________________________ Gwen Her. Dianah Field, M.D., ABFM., CAQSM. Primary Care and [place] Instructor of [place] of [facility] of Medicine

## 2019-01-23 NOTE — Assessment & Plan Note (Signed)
Lacy's blood pressure is well controlled. Unfortunately he has developed a cough which is a side effect of treatment. Switching to valsartan/HCTZ. Otherwise continue current medications.

## 2019-01-27 ENCOUNTER — Other Ambulatory Visit: Payer: Self-pay

## 2019-01-27 ENCOUNTER — Telehealth: Payer: Self-pay | Admitting: Physical Therapy

## 2019-01-27 ENCOUNTER — Encounter: Payer: BC Managed Care – PPO | Admitting: Physical Therapy

## 2019-01-27 DIAGNOSIS — M24112 Other articular cartilage disorders, left shoulder: Secondary | ICD-10-CM | POA: Diagnosis not present
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
## 2019-01-30 ENCOUNTER — Other Ambulatory Visit: Payer: Self-pay

## 2019-01-30 ENCOUNTER — Encounter: Payer: Self-pay | Admitting: Sports Medicine

## 2019-01-30 ENCOUNTER — Ambulatory Visit (INDEPENDENT_AMBULATORY_CARE_PROVIDER_SITE_OTHER): Payer: BC Managed Care – PPO | Admitting: Sports Medicine

## 2019-01-30 DIAGNOSIS — I10 Essential (primary) hypertension: Secondary | ICD-10-CM | POA: Diagnosis not present

## 2019-01-30 DIAGNOSIS — H6123 Impacted cerumen, bilateral: Secondary | ICD-10-CM | POA: Diagnosis not present

## 2019-01-30 DIAGNOSIS — Z0000 Encounter for general adult medical examination without abnormal findings: Secondary | ICD-10-CM

## 2019-01-30 NOTE — Assessment & Plan Note (Signed)
Routine physical as above, up-to-date on screening measures. Return in 1 year.

## 2019-01-30 NOTE — Assessment & Plan Note (Signed)
Well-controlled now, he did have a cough with ACE inhibitor's, switched to valsartan/HCTZ and this is resolving now. No changes.

## 2019-01-30 NOTE — Progress Notes (Signed)
Subjective:    CC: Annual Physical Exam  HPI:  This patient is here for their annual physical  I reviewed the past medical history, family history, social history, surgical history, and allergies today and no changes were needed.  Please see the problem list section below in epic for further details.  Past Medical History: Past Medical History:  Diagnosis Date  . Hyperlipidemia   . Hypertension   . Seasonal allergies   . Transaminitis    Past Surgical History: Past Surgical History:  Procedure Laterality Date  . BICEPT TENODESIS Left 10/06/2018   Procedure: BICEPS TENODESIS;  Surgeon: Hiram Gash, MD;  Location: [place];  Service: Orthopedics;  Laterality: Left;  . HERNIA REPAIR    . KNEE ARTHROSCOPY    . ROTATOR CUFF REPAIR    . SHOULDER ARTHROSCOPY WITH ROTATOR CUFF REPAIR AND SUBACROMIAL DECOMPRESSION Left 10/06/2018   Procedure: LEFT SHOULDER ARTHROSCOPY WITH ROTATOR CUFF REPAIR AND SUBACROMIAL DECOMPRESSION, EXTENSIVE DEBRIDEMENT, BICEP TENODESIS;  Surgeon: Hiram Gash, MD;  Location: [facility];  Service: Orthopedics;  Laterality: Left;  BLOCK   Social History: Social History   Socioeconomic History  . Marital status: Single    Spouse name: Not on file  . Number of children: 3  . Years of education: Not on file  . Highest education level: Not on file  Occupational History  . Not on file  Tobacco Use  . Smoking status: Never Smoker  . Smokeless tobacco: Never Used  Substance and Sexual Activity  . Alcohol use: Yes    Alcohol/week: 3.0 standard drinks    Types: 3 Standard drinks or equivalent per week    Comment: Occasional  . Drug use: No  . Sexual activity: Not on file  Other Topics Concern  . Not on file  Social History Narrative  . Not on file   Social Determinants of Health   Financial Resource Strain:   . Difficulty of Paying Living Expenses: Not on file  Food Insecurity:   . Worried About Charity fundraiser in  the Last Year: Not on file  . Ran Out of Food in the Last Year: Not on file  Transportation Needs:   . Lack of Transportation (Medical): Not on file  . Lack of Transportation (Non-Medical): Not on file  Physical Activity:   . Days of Exercise per Week: Not on file  . Minutes of Exercise per Session: Not on file  Stress:   . Feeling of Stress : Not on file  Social Connections:   . Frequency of Communication with Friends and Family: Not on file  . Frequency of Social Gatherings with Friends and Family: Not on file  . Attends Religious Services: Not on file  . Active Member of Clubs or Organizations: Not on file  . Attends Archivist Meetings: Not on file  . Marital Status: Not on file   Family History: Family History  Problem Relation Age of Onset  . Cancer Father        prostate  . Breast cancer Paternal Grandmother    Allergies: Allergies  Allergen Reactions  . Ace Inhibitors Cough   Medications: See med rec.  Review of Systems: No headache, visual changes, nausea, vomiting, diarrhea, constipation, dizziness, abdominal pain, skin rash, fevers, chills, night sweats, swollen lymph nodes, weight loss, chest pain, body aches, joint swelling, muscle aches, shortness of breath, mood changes, visual or auditory hallucinations.  Objective:    General: Well Developed, well  nourished, and in no acute distress.  Neuro: Alert and oriented x3, extra-ocular muscles intact, sensation grossly intact. Cranial nerves II through XII are intact, motor, sensory, and coordinative functions are all intact. HEENT: Normocephalic, atraumatic, pupils equal round reactive to light, neck supple, no masses, no lymphadenopathy, thyroid nonpalpable. Oropharynx, nasopharynx are unremarkable, bilateral cerumen impactions. Skin: Warm and dry, no rashes noted.  Cardiac: Regular rate and rhythm, no murmurs rubs or gallops.  Respiratory: Clear to auscultation bilaterally. Not using accessory muscles,  speaking in full sentences.  Abdominal: Soft, nontender, nondistended, positive bowel sounds, no masses, no organomegaly.  Musculoskeletal: Shoulder, elbow, wrist, hip, knee, ankle stable, and with full range of motion.  Indication: Cerumen impaction of the left and right ear(s) Medical necessity statement: On physical examination, cerumen impairs clinically significant portions of the external auditory canal, and tympanic membrane. Noted obstructive, copious cerumen that cannot be removed without magnification and instrumentations requiring physician skills Consent: Discussed benefits and risks of procedure and verbal consent obtained Procedure: Patient was prepped for the procedure. Utilized an otoscope to assess and take note of the ear canal, the tympanic membrane, and the presence, amount, and placement of the cerumen. Gentle water irrigation and soft plastic curette was utilized to remove cerumen.  Post procedure examination: shows cerumen was completely removed. Patient tolerated procedure well. The patient is made aware that they may experience temporary vertigo, temporary hearing loss, and temporary discomfort. If these symptom last for more than 24 hours to call the clinic or proceed to the ED.  Impression and Recommendations:    The patient was counselled, risk factors were discussed, anticipatory guidance given.  Annual physical exam Routine physical as above, up-to-date on screening measures. Return in 1 year.  Essential hypertension, benign Well-controlled now, he did have a cough with ACE inhibitor's, switched to valsartan/HCTZ and this is resolving now. No changes.   ___________________________________________ Gwen Her. Dianah Field, M.D., ABFM., CAQSM. Primary Care and Sports Medicine [facility]  Adjunct Professor of [place] of [facility] of Medicine

## 2019-02-03 ENCOUNTER — Telehealth: Payer: Self-pay | Admitting: Sports Medicine

## 2019-02-03 DIAGNOSIS — G4733 Obstructive sleep apnea (adult) (pediatric): Secondary | ICD-10-CM | POA: Insufficient documentation

## 2019-02-03 NOTE — Assessment & Plan Note (Signed)
Primitivo tested positive for obstructive sleep apnea with AHI of 11.9, I am going to go and order his CPAP with auto titration. Sleep study results will be scanned into the media tab.

## 2019-02-03 NOTE — Telephone Encounter (Signed)
Jerry Escobar tested positive for obstructive sleep apnea with AHI of 11.9, I am going to go and order his CPAP with auto titration.  Sleep study results will be scanned into the media tab.

## 2019-03-21 DIAGNOSIS — Z20828 Contact with and (suspected) exposure to other viral communicable diseases: Secondary | ICD-10-CM | POA: Diagnosis not present

## 2019-03-23 DIAGNOSIS — Z20828 Contact with and (suspected) exposure to other viral communicable diseases: Secondary | ICD-10-CM | POA: Diagnosis not present

## 2019-05-17 ENCOUNTER — Other Ambulatory Visit: Payer: Self-pay

## 2019-05-17 ENCOUNTER — Ambulatory Visit (INDEPENDENT_AMBULATORY_CARE_PROVIDER_SITE_OTHER): Payer: BC Managed Care – PPO | Admitting: Sports Medicine

## 2019-05-17 ENCOUNTER — Encounter: Payer: Self-pay | Admitting: Sports Medicine

## 2019-05-17 ENCOUNTER — Ambulatory Visit (INDEPENDENT_AMBULATORY_CARE_PROVIDER_SITE_OTHER): Payer: BC Managed Care – PPO

## 2019-05-17 DIAGNOSIS — R0789 Other chest pain: Secondary | ICD-10-CM

## 2019-05-17 DIAGNOSIS — G5603 Carpal tunnel syndrome, bilateral upper limbs: Secondary | ICD-10-CM

## 2019-05-17 DIAGNOSIS — R079 Chest pain, unspecified: Secondary | ICD-10-CM | POA: Diagnosis not present

## 2019-05-17 NOTE — Assessment & Plan Note (Addendum)
For the past month this pleasant 49 year old male has had tightness on his flanks, worse with taking deep breaths. It is nonexertional, it is not substernal, it does not radiate to his neck or his arms, he has no nausea, diaphoresis, palpitations. He is however profoundly concerned that this is cardiac. He did have a negative exercise stress test in 2016. LDL recently was 125.  He is a non-smoker, blood pressure normal, and A1c was normal. Considering the pleuritic nature of this we are going to check a chest x-ray, CBC, CMP, D-dimer. I am also going to set him up for a treadmill stress test. ECG today.

## 2019-05-17 NOTE — Assessment & Plan Note (Signed)
Doing continues to have feels to be a subcutaneous lipoma. I injected about 3 years ago and had some relief, the plan was to proceed with an MRI of the chest wall with IV contrast if persistent discomfort. I do think we should get the cardiac work-up done first.

## 2019-05-17 NOTE — Addendum Note (Signed)
Addended by: Beatris Ship L on: 05/17/2019 10:13 AM   Modules accepted: Orders

## 2019-05-17 NOTE — Assessment & Plan Note (Signed)
Duane has noted tingling in his thumbs at night. On exam he has a positive Phalen sign but a negative Tinel's sign. He will wear some nighttime splints for the next month and then return to see me, if persistent symptoms we will proceed with bilateral median nerve hydrodissection.

## 2019-05-17 NOTE — Progress Notes (Addendum)
    Procedures performed today:    Twelve-lead ECG personally reviewed, normal sinus rhythm with normal axis, rate of 91, there do appear to be biphasic P waves in V2.  No ST or other PR changes/abnormalities.  Otherwise unchanged from ECG approximately 5 years ago.  Independent interpretation of notes and tests performed by another provider:   None.  Brief History, Exam, Impression, and Recommendations:    Chest tightness For the past month this pleasant 49 year old male has had tightness on his flanks, worse with taking deep breaths. It is nonexertional, it is not substernal, it does not radiate to his neck or his arms, he has no nausea, diaphoresis, palpitations. He is however profoundly concerned that this is cardiac. He did have a negative exercise stress test in 2016. LDL recently was 125.  He is a non-smoker, blood pressure normal, and A1c was normal. Considering the pleuritic nature of this we are going to check a chest x-ray, CBC, CMP, D-dimer. I am also going to set him up for a treadmill stress test. ECG today.   Right-sided chest wall pain Doing continues to have feels to be a subcutaneous lipoma. I injected about 3 years ago and had some relief, the plan was to proceed with an MRI of the chest wall with IV contrast if persistent discomfort. I do think we should get the cardiac work-up done first.  Carpal tunnel syndrome, bilateral [place] has noted tingling in his thumbs at night. On exam he has a positive Phalen sign but a negative Tinel's sign. He will wear some nighttime splints for the next month and then return to see me, if persistent symptoms we will proceed with bilateral median nerve hydrodissection.    ___________________________________________ Gwen Her. Dianah Field, M.D., ABFM., CAQSM. Primary Care and [place] Instructor of [place] of [facility] of Medicine

## 2019-05-18 LAB — D-DIMER, QUANTITATIVE: D-Dimer, Quant: 0.23 mcg/mL FEU (ref <0.50)

## 2019-05-18 LAB — CBC
HCT: 39.9 % (ref 38.5–50.0)
Hemoglobin: 13.6 g/dL (ref 13.2–17.1)
MCH: 27.6 pg (ref 27.0–33.0)
MCHC: 34.1 g/dL (ref 32.0–36.0)
MCV: 80.9 fL (ref 80.0–100.0)
MPV: 11.6 fL (ref 7.5–12.5)
Platelets: 208 10*3/uL (ref 140–400)
RBC: 4.93 10*6/uL (ref 4.20–5.80)
RDW: 13.2 % (ref 11.0–15.0)
WBC: 4.7 10*3/uL (ref 3.8–10.8)

## 2019-05-18 LAB — COMPLETE METABOLIC PANEL WITH GFR
AG Ratio: 1.6 (calc) (ref 1.0–2.5)
ALT: 42 U/L (ref 9–46)
AST: 32 U/L (ref 10–40)
Albumin: 4.6 g/dL (ref 3.6–5.1)
Alkaline phosphatase (APISO): 54 U/L (ref 36–130)
BUN: 22 mg/dL (ref 7–25)
CO2: 28 mmol/L (ref 20–32)
Calcium: 9.2 mg/dL (ref 8.6–10.3)
Chloride: 103 mmol/L (ref 98–110)
Creat: 1.23 mg/dL (ref 0.60–1.35)
GFR, Est African American: 80 mL/min/{1.73_m2} (ref >=60)
GFR, Est Non African American: 69 mL/min/{1.73_m2} (ref >=60)
Globulin: 2.9 g/dL (calc) (ref 1.9–3.7)
Glucose, Bld: 100 mg/dL — ABNORMAL HIGH (ref 65–99)
Potassium: 4.1 mmol/L (ref 3.5–5.3)
Sodium: 138 mmol/L (ref 135–146)
Total Bilirubin: 0.4 mg/dL (ref 0.2–1.2)
Total Protein: 7.5 g/dL (ref 6.1–8.1)

## 2019-05-18 LAB — BRAIN NATRIURETIC PEPTIDE: Brain Natriuretic Peptide: <4 pg/mL (ref <100)

## 2019-05-25 ENCOUNTER — Other Ambulatory Visit: Payer: Self-pay | Admitting: Sports Medicine

## 2019-05-25 DIAGNOSIS — N529 Male erectile dysfunction, unspecified: Secondary | ICD-10-CM

## 2019-06-27 DIAGNOSIS — R519 Headache, unspecified: Secondary | ICD-10-CM | POA: Diagnosis not present

## 2019-06-27 DIAGNOSIS — M542 Cervicalgia: Secondary | ICD-10-CM | POA: Diagnosis not present

## 2019-06-27 DIAGNOSIS — M62830 Muscle spasm of back: Secondary | ICD-10-CM | POA: Diagnosis not present

## 2019-06-27 DIAGNOSIS — Y92410 Unspecified street and highway as the place of occurrence of the external cause: Secondary | ICD-10-CM | POA: Diagnosis not present

## 2019-06-27 DIAGNOSIS — R52 Pain, unspecified: Secondary | ICD-10-CM | POA: Diagnosis not present

## 2019-06-27 DIAGNOSIS — M62838 Other muscle spasm: Secondary | ICD-10-CM | POA: Diagnosis not present

## 2019-06-27 DIAGNOSIS — M25512 Pain in left shoulder: Secondary | ICD-10-CM | POA: Diagnosis not present

## 2019-06-27 DIAGNOSIS — I10 Essential (primary) hypertension: Secondary | ICD-10-CM | POA: Diagnosis not present

## 2019-06-27 DIAGNOSIS — Z79899 Other long term (current) drug therapy: Secondary | ICD-10-CM | POA: Diagnosis not present

## 2019-07-04 ENCOUNTER — Ambulatory Visit: Payer: BC Managed Care – PPO | Admitting: Sports Medicine

## 2019-07-04 ENCOUNTER — Encounter: Payer: Self-pay | Admitting: Sports Medicine

## 2019-07-04 ENCOUNTER — Other Ambulatory Visit: Payer: Self-pay

## 2019-07-04 DIAGNOSIS — S134XXA Sprain of ligaments of cervical spine, initial encounter: Secondary | ICD-10-CM | POA: Diagnosis not present

## 2019-07-04 MED ORDER — PREDNISONE 50 MG PO TABS: ORAL_TABLET | ORAL | 0 refills | Status: DC
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
## 2019-07-07 ENCOUNTER — Telehealth: Payer: Self-pay | Admitting: Sports Medicine

## 2019-07-07 NOTE — Telephone Encounter (Signed)
Disability papers filled out today.

## 2019-07-10 ENCOUNTER — Ambulatory Visit: Payer: BC Managed Care – PPO | Admitting: Rehabilitative and Restorative Service Providers"

## 2019-07-13 ENCOUNTER — Ambulatory Visit: Payer: BC Managed Care – PPO | Admitting: Physical Therapy

## 2019-07-18 ENCOUNTER — Other Ambulatory Visit: Payer: Self-pay

## 2019-07-18 ENCOUNTER — Ambulatory Visit (INDEPENDENT_AMBULATORY_CARE_PROVIDER_SITE_OTHER): Payer: BC Managed Care – PPO | Admitting: Sports Medicine

## 2019-07-18 DIAGNOSIS — M545 Low back pain, unspecified: Secondary | ICD-10-CM

## 2019-07-18 DIAGNOSIS — S134XXD Sprain of ligaments of cervical spine, subsequent encounter: Secondary | ICD-10-CM | POA: Diagnosis not present

## 2019-07-18 NOTE — Assessment & Plan Note (Signed)
Increasing back pain, better with forward flexion, was doing okay until an aggressive manipulation with his chiropractor. We will give this a week to resolve before he goes back to work, symptoms are consistent with lumbar spinal stenosis.
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
## 2019-07-19 ENCOUNTER — Ambulatory Visit (INDEPENDENT_AMBULATORY_CARE_PROVIDER_SITE_OTHER): Payer: Self-pay | Admitting: Rehabilitative and Restorative Service Providers"

## 2019-07-19 DIAGNOSIS — M545 Low back pain, unspecified: Secondary | ICD-10-CM

## 2019-07-19 DIAGNOSIS — R293 Abnormal posture: Secondary | ICD-10-CM

## 2019-07-19 DIAGNOSIS — R29898 Other symptoms and signs involving the musculoskeletal system: Secondary | ICD-10-CM

## 2019-07-19 DIAGNOSIS — M542 Cervicalgia: Secondary | ICD-10-CM
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
## 2019-07-19 NOTE — Patient Instructions (Signed)
Access Code: [email or web address] URL: [email or web address] Date: 07/19/2019 Prepared by: Rudell Cobb  Exercises Supine Chin Tuck - 2 x daily - 7 x weekly - 1 sets - 10 reps Supine Thoracic Mobilization Towel Roll Vertical with Arm Stretch - 2 x daily - 7 x weekly - 1 sets - 1 reps - 2 minutes hold Supine Single Knee to Chest Stretch - 2 x daily - 7 x weekly - 1 sets - 3 reps - 30 seconds hold Wall Quarter Squat - 2 x daily - 7 x weekly - 1 sets - 10 reps Seated Upper Trapezius Stretch - 2 x daily - 7 x weekly - 1 sets - 2-3 reps - 30 seconds hold

## 2019-07-27 ENCOUNTER — Ambulatory Visit (INDEPENDENT_AMBULATORY_CARE_PROVIDER_SITE_OTHER): Payer: Self-pay | Admitting: Rehabilitative and Restorative Service Providers"

## 2019-07-27 ENCOUNTER — Other Ambulatory Visit: Payer: Self-pay

## 2019-07-27 ENCOUNTER — Encounter: Payer: Self-pay | Admitting: Rehabilitative and Restorative Service Providers"

## 2019-07-27 DIAGNOSIS — M545 Low back pain, unspecified: Secondary | ICD-10-CM

## 2019-07-27 DIAGNOSIS — R293 Abnormal posture: Secondary | ICD-10-CM

## 2019-07-27 DIAGNOSIS — M542 Cervicalgia: Secondary | ICD-10-CM

## 2019-07-27 DIAGNOSIS — R29898 Other symptoms and signs involving the musculoskeletal system: Secondary | ICD-10-CM

## 2019-07-27 NOTE — Patient Instructions (Addendum)
[email or web address] Code: [email or web address]: [email or web address]: 07/08/2021Prepared by: Larence Thone HoltExercises  Supine Chin Tuck - 2 x daily - 7 x weekly - 1 sets - 10 reps  Supine Thoracic Mobilization Towel Roll Vertical with Arm Stretch - 2 x daily - 7 x weekly - 1 sets - 1 reps - 2 minutes hold  Supine Single Knee to Chest Stretch - 2 x daily - 7 x weekly - 1 sets - 3 reps - 30 seconds hold  Wall Quarter Squat - 2 x daily - 7 x weekly - 1 sets - 10 reps  Seated Upper Trapezius Stretch - 2 x daily - 7 x weekly - 1 sets - 2-3 reps - 30 seconds hold  Doorway Pec Stretch at 60 Degrees Abduction - 3 x daily - 7 x weekly - 3 reps - 1 sets  Doorway Pec Stretch at 90 Degrees Abduction - 3 x daily - 7 x weekly - 3 reps - 1 sets - 30 seconds hold  Doorway Pec Stretch at 120 Degrees Abduction - 3 x daily - 7 x weekly - 3 reps - 1 sets - 30 second hold hold  Standing Shoulder External Rotation with Resistance - 2 x daily - 7 x weekly - 1-2 sets - 10 reps - 3 sec hold  Standing Bilateral Low Shoulder Row with Anchored Resistance - 2 x daily - 7 x weekly - 1-2 sets - 10 reps - 3 sec hold  Shoulder Extension with Resistance - 2 x daily - 7 x weekly - 1-2 sets - 10 reps - 3 sec hold  Seated Scapular Retraction - 2 x daily - 7 x weekly - 1-2 sets - 10 reps - 5-10 sec hold  Sit to Stand - 2 x daily - 7 x weekly - 1 sets - 10 reps - 3-5 sec hold
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
## 2019-08-01 ENCOUNTER — Ambulatory Visit (INDEPENDENT_AMBULATORY_CARE_PROVIDER_SITE_OTHER): Payer: Self-pay | Admitting: Physical Therapy

## 2019-08-01 ENCOUNTER — Other Ambulatory Visit: Payer: Self-pay

## 2019-08-01 DIAGNOSIS — M542 Cervicalgia: Secondary | ICD-10-CM

## 2019-08-01 DIAGNOSIS — R293 Abnormal posture: Secondary | ICD-10-CM

## 2019-08-01 DIAGNOSIS — R29898 Other symptoms and signs involving the musculoskeletal system: Secondary | ICD-10-CM

## 2019-08-01 DIAGNOSIS — M545 Low back pain, unspecified: Secondary | ICD-10-CM
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
## 2019-08-01 NOTE — Patient Instructions (Signed)
Access Code: [email or web address]: [email or web address]: 07/13/2021Prepared by: [facility] [place]Exercises  Supine Chin Tuck - 2 x daily - 7 x weekly - 1 sets - 10 reps  Supine Thoracic Mobilization Towel Roll Vertical with Arm Stretch - 2 x daily - 7 x weekly - 1 sets - 1 reps - 2 minutes hold  Supine Single Knee to Chest Stretch - 2 x daily - 7 x weekly - 1 sets - 3 reps - 30 seconds hold  Wall Quarter Squat - 2 x daily - 7 x weekly - 1 sets - 10 reps  Seated Upper Trapezius Stretch - 2 x daily - 7 x weekly - 1 sets - 2-3 reps - 30 seconds hold  Doorway Pec Stretch at 60 Degrees Abduction - 3 x daily - 7 x weekly - 3 reps - 1 sets  Doorway Pec Stretch at 90 Degrees Abduction - 3 x daily - 7 x weekly - 3 reps - 1 sets - 30 seconds hold  Doorway Pec Stretch at 120 Degrees Abduction - 3 x daily - 7 x weekly - 3 reps - 1 sets - 30 second hold hold  Standing Shoulder External Rotation with Resistance - 2 x daily - 7 x weekly - 1-2 sets - 10 reps - 3 sec hold  Standing Bilateral Low Shoulder Row with Anchored Resistance - 2 x daily - 7 x weekly - 1-2 sets - 10 reps - 3 sec hold  Shoulder Extension with Resistance - 2 x daily - 7 x weekly - 1-2 sets - 10 reps - 3 sec hold  Seated Scapular Retraction - 2 x daily - 7 x weekly - 1-2 sets - 10 reps - 5-10 sec hold  Sit to Stand - 2 x daily - 7 x weekly - 1 sets - 10 reps - 3-5 sec hold  Seated Piriformis Stretch - 2 x daily - 7 x weekly - 1 sets - 2 reps - 15 hold  Sidelying Thoracic Rotation with Open Book - 2 x daily - 7 x weekly - 1 sets - 2 reps - 15 hold

## 2019-08-03 ENCOUNTER — Ambulatory Visit (INDEPENDENT_AMBULATORY_CARE_PROVIDER_SITE_OTHER): Payer: Self-pay | Admitting: Rehabilitative and Restorative Service Providers"

## 2019-08-03 ENCOUNTER — Other Ambulatory Visit: Payer: Self-pay

## 2019-08-03 ENCOUNTER — Encounter: Payer: Self-pay | Admitting: Rehabilitative and Restorative Service Providers"

## 2019-08-03 DIAGNOSIS — M545 Low back pain, unspecified: Secondary | ICD-10-CM

## 2019-08-03 DIAGNOSIS — R29898 Other symptoms and signs involving the musculoskeletal system: Secondary | ICD-10-CM

## 2019-08-03 DIAGNOSIS — M542 Cervicalgia: Secondary | ICD-10-CM

## 2019-08-03 DIAGNOSIS — R293 Abnormal posture: Secondary | ICD-10-CM
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
## 2019-08-03 NOTE — Patient Instructions (Signed)
Access Code: [email or web address]: [email or web address]: 07/15/2021Prepared by: Lycia Sachdeva HoltExercises  Supine Chin Tuck - 2 x daily - 7 x weekly - 1 sets - 10 reps  Supine Thoracic Mobilization Towel Roll Vertical with Arm Stretch - 2 x daily - 7 x weekly - 1 sets - 1 reps - 2 minutes hold  Supine Single Knee to Chest Stretch - 2 x daily - 7 x weekly - 1 sets - 3 reps - 30 seconds hold  Wall Quarter Squat - 2 x daily - 7 x weekly - 1 sets - 10 reps  Seated Upper Trapezius Stretch - 2 x daily - 7 x weekly - 1 sets - 2-3 reps - 30 seconds hold  Doorway Pec Stretch at 60 Degrees Abduction - 3 x daily - 7 x weekly - 3 reps - 1 sets  Doorway Pec Stretch at 90 Degrees Abduction - 3 x daily - 7 x weekly - 3 reps - 1 sets - 30 seconds hold  Doorway Pec Stretch at 120 Degrees Abduction - 3 x daily - 7 x weekly - 3 reps - 1 sets - 30 second hold hold  Standing Shoulder External Rotation with Resistance - 2 x daily - 7 x weekly - 1-2 sets - 10 reps - 3 sec hold  Standing Bilateral Low Shoulder Row with Anchored Resistance - 2 x daily - 7 x weekly - 1-2 sets - 10 reps - 3 sec hold  Shoulder Extension with Resistance - 2 x daily - 7 x weekly - 1-2 sets - 10 reps - 3 sec hold  Seated Scapular Retraction - 2 x daily - 7 x weekly - 1-2 sets - 10 reps - 5-10 sec hold  Sit to Stand - 2 x daily - 7 x weekly - 1 sets - 10 reps - 3-5 sec hold  Seated Piriformis Stretch - 2 x daily - 7 x weekly - 1 sets - 2 reps - 15 hold  Sidelying Thoracic Rotation with Open Book - 2 x daily - 7 x weekly - 1 sets - 2 reps - 15 hold  Gastroc Stretch on Wall - 2 x daily - 7 x weekly - 1 sets - 3 reps - 30 sec hold  Standing Soleus Stretch - 2 x daily - 7 x weekly - 1 sets - 3 reps - 30 sec hold  Standing Shoulder External Rotation with Resistance - 2 x daily - 7 x weekly - 1-2 sets - 10 reps - 2-3 sec hold

## 2019-08-08 ENCOUNTER — Encounter: Payer: BC Managed Care – PPO | Admitting: Physical Therapy

## 2019-08-08 ENCOUNTER — Other Ambulatory Visit: Payer: Self-pay

## 2019-08-08 ENCOUNTER — Telehealth: Payer: Self-pay | Admitting: Physical Therapy

## 2019-08-08 NOTE — Telephone Encounter (Signed)
Patient did not show for physical therapy appt.  Called his cell phone number; left HIPAA compliant message for him to return call to [facility].  [phone]).  Kerin Perna, PTA 08/08/19 8:19 AM

## 2019-08-10 ENCOUNTER — Ambulatory Visit (INDEPENDENT_AMBULATORY_CARE_PROVIDER_SITE_OTHER): Payer: Self-pay | Admitting: Physical Therapy

## 2019-08-10 ENCOUNTER — Encounter: Payer: BC Managed Care – PPO | Admitting: Rehabilitative and Restorative Service Providers"

## 2019-08-10 ENCOUNTER — Other Ambulatory Visit: Payer: Self-pay

## 2019-08-10 ENCOUNTER — Encounter: Payer: Self-pay | Admitting: Physical Therapy

## 2019-08-10 DIAGNOSIS — R293 Abnormal posture: Secondary | ICD-10-CM

## 2019-08-10 DIAGNOSIS — R29898 Other symptoms and signs involving the musculoskeletal system: Secondary | ICD-10-CM

## 2019-08-10 DIAGNOSIS — M545 Low back pain, unspecified: Secondary | ICD-10-CM

## 2019-08-10 DIAGNOSIS — M542 Cervicalgia: Secondary | ICD-10-CM
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
## 2019-08-15 ENCOUNTER — Encounter: Payer: Self-pay | Admitting: Physical Therapy

## 2019-08-15 ENCOUNTER — Ambulatory Visit (INDEPENDENT_AMBULATORY_CARE_PROVIDER_SITE_OTHER): Payer: Self-pay | Admitting: Physical Therapy

## 2019-08-15 ENCOUNTER — Other Ambulatory Visit: Payer: Self-pay

## 2019-08-15 DIAGNOSIS — M545 Low back pain, unspecified: Secondary | ICD-10-CM

## 2019-08-15 DIAGNOSIS — R29898 Other symptoms and signs involving the musculoskeletal system: Secondary | ICD-10-CM

## 2019-08-15 DIAGNOSIS — R293 Abnormal posture: Secondary | ICD-10-CM

## 2019-08-15 DIAGNOSIS — M542 Cervicalgia: Secondary | ICD-10-CM
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
## 2019-08-17 ENCOUNTER — Encounter: Payer: BC Managed Care – PPO | Admitting: Physical Therapy

## 2019-09-24 IMAGING — DX DG FEMUR 2+V*R*
4 series · 4 of 4 positions shown · non-contrast
Comparison: None

CLINICAL DATA: RIGHT posterior thigh pain for 1 day, no known
injury

EXAM:
RIGHT FEMUR 2 VIEWS

[femur ap (1 of 2)]
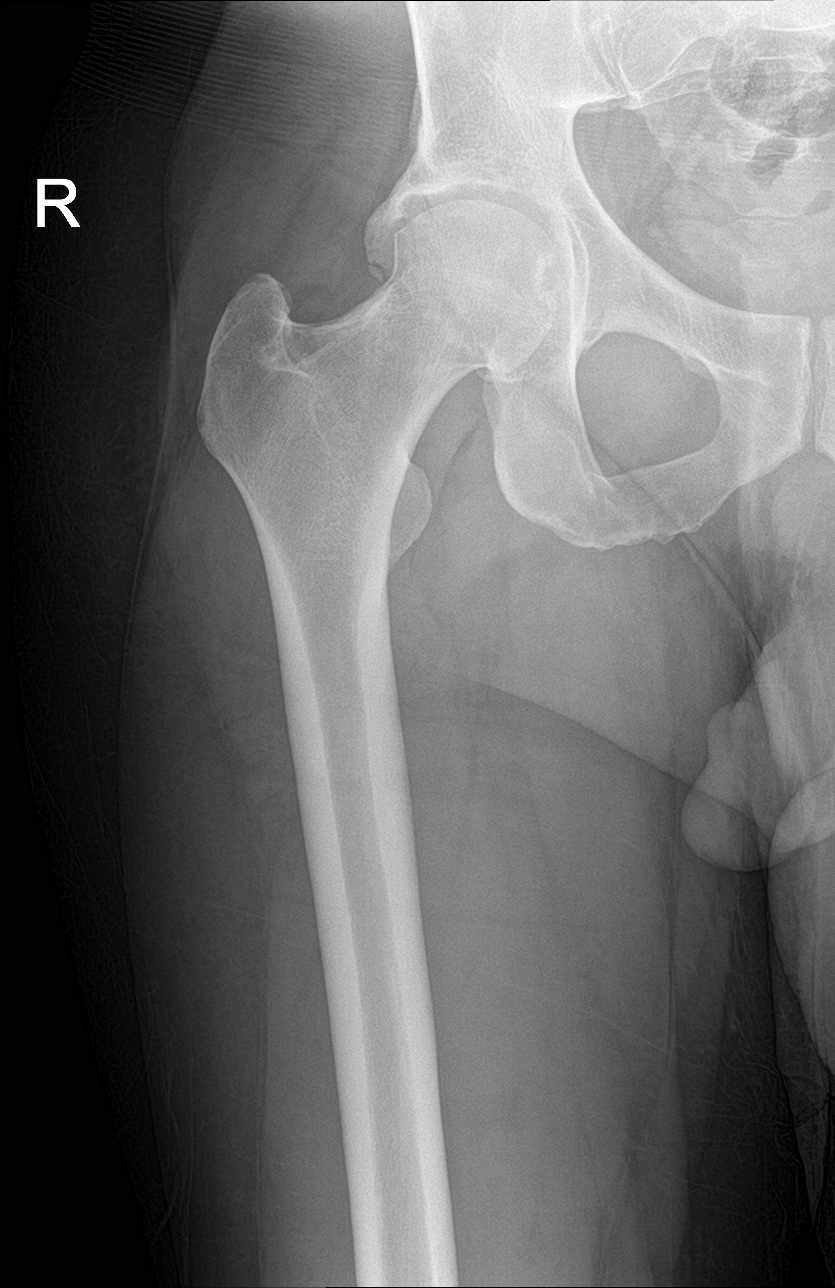

[femur ap (2 of 2)]
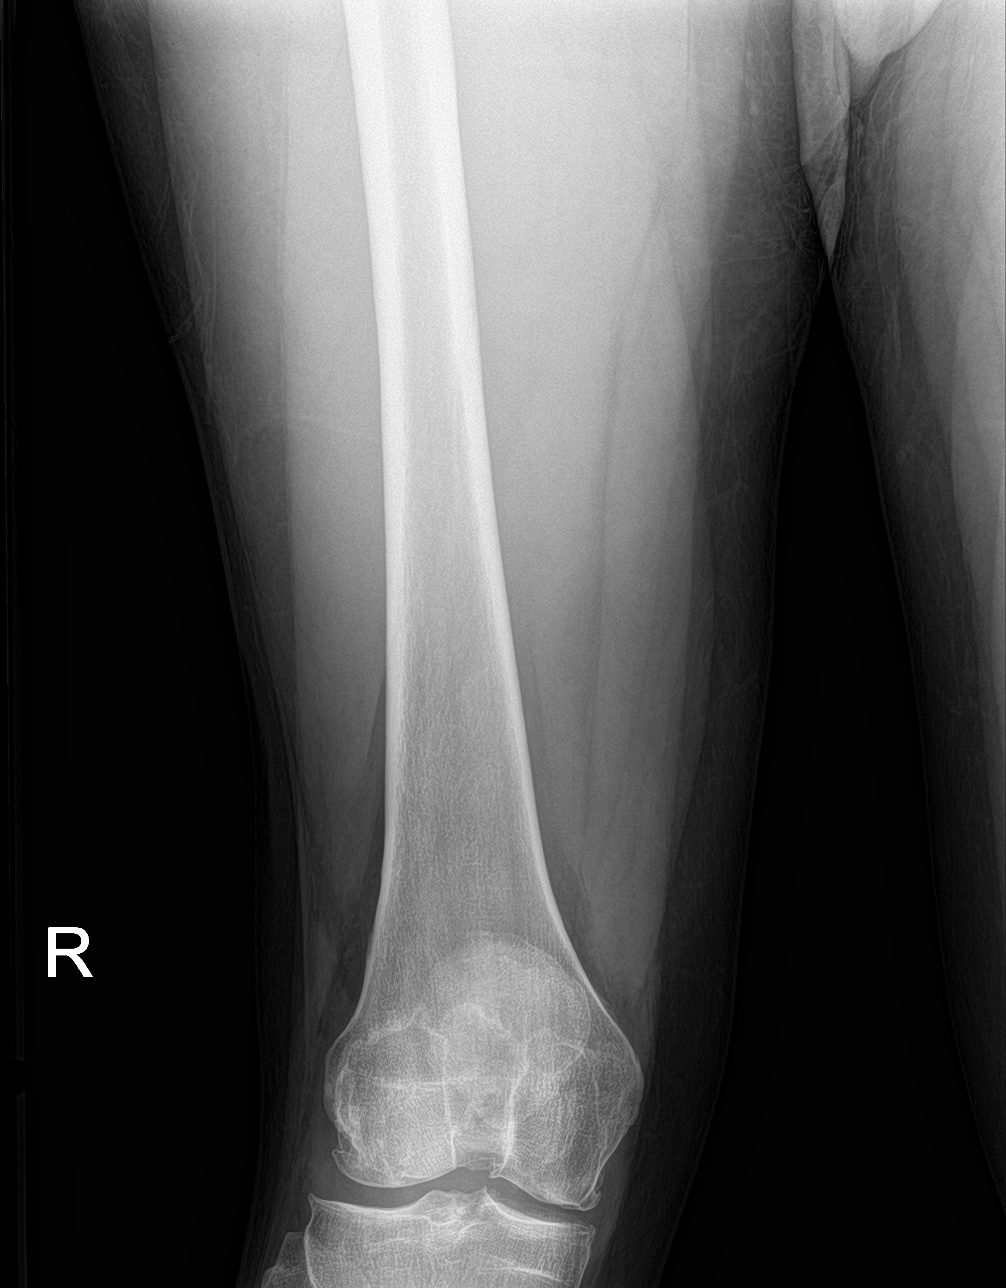

[femur lat (1 of 2)]
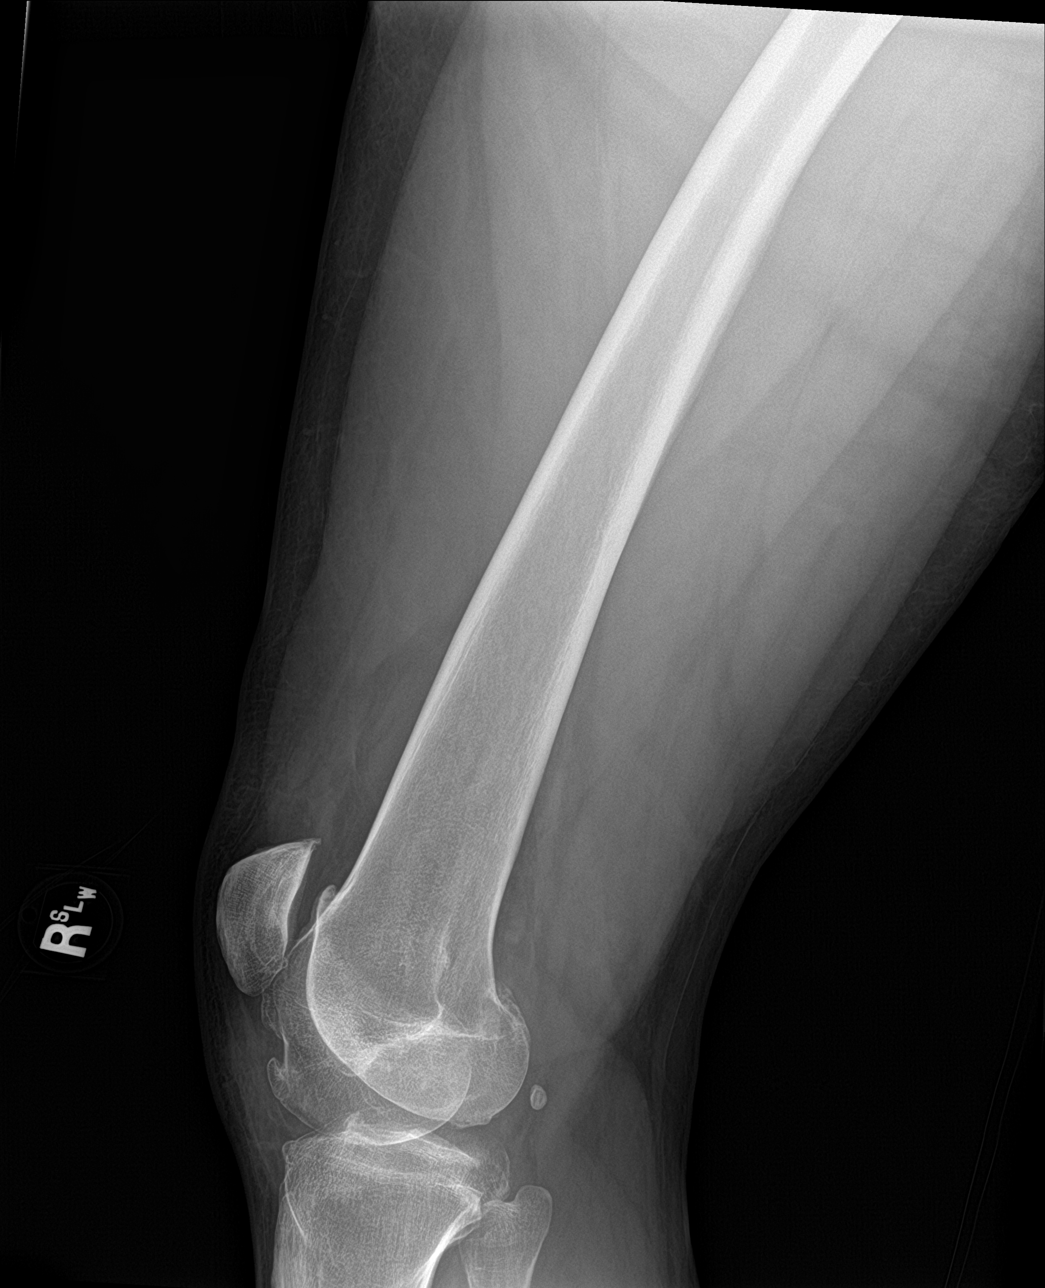

[femur lat (2 of 2)]
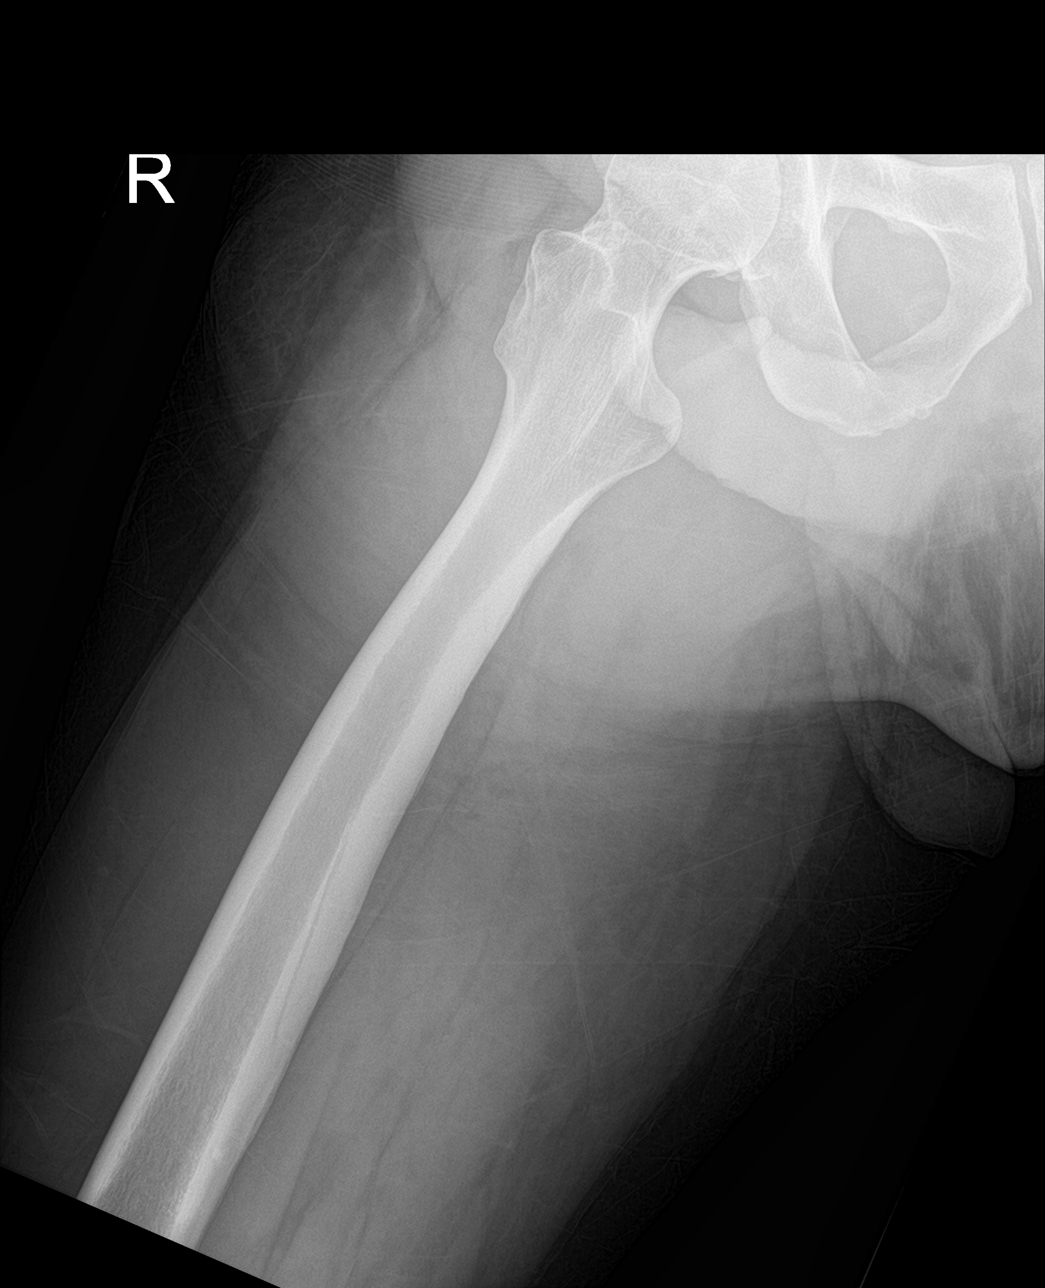

[4 of 4 positions shown; findings below may reference images not displayed]

FINDINGS: Osseous mineralization normal.

Hip joint space preserved.

Degenerative changes at RIGHT knee with joint space narrowing and
spur formation.

No acute fracture, dislocation, or bone destruction.

No knee joint effusion.
IMPRESSION: No acute osseous abnormalities.

Degenerative changes RIGHT knee.

## 2019-10-02 ENCOUNTER — Other Ambulatory Visit: Payer: Self-pay | Admitting: Sports Medicine

## 2019-10-02 DIAGNOSIS — N529 Male erectile dysfunction, unspecified: Secondary | ICD-10-CM

## 2019-10-04 ENCOUNTER — Ambulatory Visit (INDEPENDENT_AMBULATORY_CARE_PROVIDER_SITE_OTHER): Payer: BC Managed Care – PPO | Admitting: Sports Medicine

## 2019-10-04 ENCOUNTER — Other Ambulatory Visit: Payer: Self-pay

## 2019-10-04 DIAGNOSIS — H00024 Hordeolum internum left upper eyelid: Secondary | ICD-10-CM

## 2019-10-04 DIAGNOSIS — H00026 Hordeolum internum left eye, unspecified eyelid: Secondary | ICD-10-CM | POA: Insufficient documentation

## 2019-10-04 DIAGNOSIS — H00014 Hordeolum externum left upper eyelid: Secondary | ICD-10-CM | POA: Diagnosis not present

## 2019-10-04 MED ORDER — ERYTHROMYCIN 5 MG/GM OP OINT: 1.0000 "application " | TOPICAL_OINTMENT | Freq: Three times a day (TID) | OPHTHALMIC | 0 refills | Status: AC

## 2019-10-04 MED ORDER — DOXYCYCLINE HYCLATE 100 MG PO TABS: 100.0000 mg | ORAL_TABLET | Freq: Two times a day (BID) | ORAL | 0 refills | Status: AC

## 2019-10-04 NOTE — Progress Notes (Signed)
    Procedures performed today:    None.  Independent interpretation of notes and tests performed by another provider:   None.  Brief History, Exam, Impression, and Recommendations:    Internal hordeolum of left eye This is a pleasant 49 year old male, he has a baseline history of left-sided lateral strabismus. More recently he has noted swelling in his left upper eyelid, significant watering of the eye. On exam he has what appears to be an internal hordeolum of the upper eyelid, the eyeball itself appears normal, very little conjunctival injection, no anterior chamber chemosis, no hyphema. He also does not appear to have much proptosis. I am going to start doxycycline, as well as topical erythromycin, he will do warm compresses, do not take any imaging is warranted just yet but I would like him to touch base with our medical optometrist to make sure that we are not doing exactly the right thing.    ___________________________________________ Gwen Her. Dianah Field, M.D., ABFM., CAQSM. Primary Care and [place] Instructor of [place] of [facility] of Medicine

## 2019-10-04 NOTE — Assessment & Plan Note (Signed)
This is a pleasant 49 year old male, he has a baseline history of left-sided lateral strabismus. More recently he has noted swelling in his left upper eyelid, significant watering of the eye. On exam he has what appears to be an internal hordeolum of the upper eyelid, the eyeball itself appears normal, very little conjunctival injection, no anterior chamber chemosis, no hyphema. He also does not appear to have much proptosis. I am going to start doxycycline, as well as topical erythromycin, he will do warm compresses, do not take any imaging is warranted just yet but I would like him to touch base with our medical optometrist to make sure that we are not doing exactly the right thing.

## 2019-10-04 NOTE — Patient Instructions (Signed)

## 2019-10-17 ENCOUNTER — Ambulatory Visit: Payer: BC Managed Care – PPO | Admitting: Sports Medicine

## 2019-11-01 DIAGNOSIS — Z03818 Encounter for observation for suspected exposure to other biological agents ruled out: Secondary | ICD-10-CM | POA: Diagnosis not present

## 2019-11-01 DIAGNOSIS — Z20822 Contact with and (suspected) exposure to covid-19: Secondary | ICD-10-CM | POA: Diagnosis not present

## 2019-11-22 ENCOUNTER — Other Ambulatory Visit: Payer: Self-pay | Admitting: Sports Medicine

## 2019-11-22 DIAGNOSIS — I10 Essential (primary) hypertension: Secondary | ICD-10-CM

## 2019-11-22 MED ORDER — VALSARTAN-HYDROCHLOROTHIAZIDE 320-25 MG PO TABS: 1.0000 | ORAL_TABLET | Freq: Every day | ORAL | 3 refills | Status: DC

## 2019-11-22 MED ORDER — AMLODIPINE BESYLATE 10 MG PO TABS: 10.0000 mg | ORAL_TABLET | Freq: Every day | ORAL | 3 refills | Status: DC

## 2019-12-19 DIAGNOSIS — Z20822 Contact with and (suspected) exposure to covid-19: Secondary | ICD-10-CM | POA: Diagnosis not present

## 2019-12-20 ENCOUNTER — Telehealth (INDEPENDENT_AMBULATORY_CARE_PROVIDER_SITE_OTHER): Payer: BC Managed Care – PPO | Admitting: Sports Medicine

## 2019-12-20 DIAGNOSIS — M51369 Other intervertebral disc degeneration, lumbar region without mention of lumbar back pain or lower extremity pain: Secondary | ICD-10-CM

## 2019-12-20 DIAGNOSIS — R0789 Other chest pain: Secondary | ICD-10-CM | POA: Diagnosis not present

## 2019-12-20 DIAGNOSIS — M5136 Other intervertebral disc degeneration, lumbar region: Secondary | ICD-10-CM | POA: Diagnosis not present

## 2019-12-20 MED ORDER — GABAPENTIN 300 MG PO CAPS: ORAL_CAPSULE | ORAL | 3 refills | Status: DC

## 2019-12-20 NOTE — Progress Notes (Signed)
Virtual Visit via WebEx/MyChart   I connected with  Jerry Escobar  on 12/20/19 via WebEx/MyChart/Doximity Video and verified that I am speaking with the correct person using two identifiers.   I discussed the limitations, risks, security and privacy concerns of performing an evaluation and management service by WebEx/MyChart/Doximity Video, including the higher likelihood of inaccurate diagnosis and treatment, and the availability of in person appointments.  We also discussed the likely need of an additional face to face encounter for complete and high quality delivery of care.  I also discussed with the patient that there may be a patient responsible charge related to this service. The patient expressed understanding and wishes to proceed.  Provider location is in medical facility. Patient location is at their home, different from provider location. People involved in care of the patient during this telehealth encounter were myself, my nurse/medical assistant, and my front office/scheduling team member.  Review of Systems: No fevers, chills, night sweats, weight loss, chest pain, or shortness of breath.   Objective Findings:    General: Speaking full sentences, no audible heavy breathing.  Sounds alert and appropriately interactive.  Appears well.  Face symmetric.  Extraocular movements intact.  Pupils equal and round.  No nasal flaring or accessory muscle use visualized.  Independent interpretation of tests performed by another provider:   None.  Brief History, Exam, Impression, and Recommendations:    Chest tightness Jerry Escobar is a pleasant 49 year old male, he is having recurrence of his right-sided chest wall pain, worse with abduction of the shoulder and taking deep breaths. We did discuss his back in April of this year as well. At the time chest x-ray, CBC, CMP, D-dimer were all negative. He had a negative cardiac exercise stress test in 2016, and really does not have any risk  factors other than his weight. At the time we ordered an exercise treadmill test, I do not think this Escobar been done yet. ECG was unremarkable. Due to persistent symptoms we are going to aggressively evaluate him, he will need a chest x-ray, thoracic spine imaging, I am also going to add some gabapentin, because he is currently waiting on a Covid test I would like to wait until the test is negative before ordering his imaging. Differential includes thoracic radiculitis, pleurisy. We will probably get cross-sectional chest imaging as well at the time.   Lumbar degenerative disc disease We also discussed this back in June of this year, he is now having paresthesias into the right great toe, x-rays done in the summertime showed multilevel DDD. Gabapentin as above will be helpful, if insufficient improvement we will proceed with cross-sectional imaging of his lumbar spine for epidural planning per I do suspect right-sided L4 radiculopathy.   I discussed the above assessment and treatment plan with the patient. The patient was provided an opportunity to ask questions and all were answered. The patient agreed with the plan and demonstrated an understanding of the instructions.   The patient was advised to call back or seek an in-person evaluation if the symptoms worsen or if the condition fails to improve as anticipated.   I provided 30 minutes of face to face and non-face-to-face time during this encounter date, time was needed to gather information, review chart, records, communicate/coordinate with staff remotely, as well as complete documentation.   ___________________________________________ Gwen Her. Dianah Field, M.D., ABFM., CAQSM. Primary Care and [place] Instructor of [place] of [facility] of Medicine

## 2019-12-20 NOTE — Assessment & Plan Note (Signed)
Jerry Escobar is a pleasant 49 year old male, he is having recurrence of his right-sided chest wall pain, worse with abduction of the shoulder and taking deep breaths. We did discuss his back in April of this year as well. At the time chest x-ray, CBC, CMP, D-dimer were all negative. He had a negative cardiac exercise stress test in 2016, and really does not have any risk factors other than his weight. At the time we ordered an exercise treadmill test, I do not think this has been done yet. ECG was unremarkable. Due to persistent symptoms we are going to aggressively evaluate him, he will need a chest x-ray, thoracic spine imaging, I am also going to add some gabapentin, because he is currently waiting on a Covid test I would like to wait until the test is negative before ordering his imaging. Differential includes thoracic radiculitis, pleurisy. We will probably get cross-sectional chest imaging as well at the time.

## 2019-12-20 NOTE — Assessment & Plan Note (Signed)
We also discussed this back in June of this year, he is now having paresthesias into the right great toe, x-rays done in the summertime showed multilevel DDD. Gabapentin as above will be helpful, if insufficient improvement we will proceed with cross-sectional imaging of his lumbar spine for epidural planning per I do suspect right-sided L4 radiculopathy.

## 2019-12-20 NOTE — Progress Notes (Signed)
Patient started with a cough yesterday and did a COVID test yesterday and is awaiting results.

## 2019-12-21 NOTE — Telephone Encounter (Signed)
Appointment needed with either myself or one of my partners over the next couple days

## 2019-12-22 ENCOUNTER — Telehealth (INDEPENDENT_AMBULATORY_CARE_PROVIDER_SITE_OTHER): Payer: BC Managed Care – PPO

## 2019-12-22 DIAGNOSIS — U071 COVID-19: Secondary | ICD-10-CM | POA: Diagnosis not present

## 2019-12-22 MED ORDER — HYDROCOD POLST-CPM POLST ER 10-8 MG/5ML PO SUER: 5.0000 mL | Freq: Two times a day (BID) | ORAL | 0 refills | Status: DC | PRN

## 2019-12-22 NOTE — Assessment & Plan Note (Signed)
Positive test, cough but no shortness of breath, adding some Tussionex to help with symptomatology.  He understands to quarantine for at least 10 days after diagnosis.  I spent 5 total minutes of online digital evaluation and management services.

## 2019-12-22 NOTE — Telephone Encounter (Signed)
Patient called in follow up to his virtual visit earlier this week to let you know that his COVID test came back positive.

## 2019-12-22 NOTE — Telephone Encounter (Signed)
Happy to add some Tussionex.

## 2019-12-22 NOTE — Telephone Encounter (Signed)
Patient aware that a prescription for cough medication had been sent to pharmacy.

## 2019-12-22 NOTE — Telephone Encounter (Signed)
Noted, if symptoms remain mild we do not need to follow-up.  Otherwise we can do another virtual.

## 2019-12-22 NOTE — Telephone Encounter (Signed)
Patient reports his cough/laugh is making his ribs/sides hurt.

## 2019-12-22 NOTE — Telephone Encounter (Signed)
Spoke with patient and patient stated that he didn't want to schedule right now, he wanted to be seen in person once his days were up and that he would call back to schedule this appointment . AM

## 2019-12-27 DIAGNOSIS — Z20822 Contact with and (suspected) exposure to covid-19: Secondary | ICD-10-CM | POA: Diagnosis not present

## 2019-12-30 DIAGNOSIS — Z20822 Contact with and (suspected) exposure to covid-19: Secondary | ICD-10-CM | POA: Diagnosis not present

## 2019-12-30 DIAGNOSIS — Z03818 Encounter for observation for suspected exposure to other biological agents ruled out: Secondary | ICD-10-CM | POA: Diagnosis not present

## 2020-01-31 ENCOUNTER — Other Ambulatory Visit: Payer: Self-pay

## 2020-01-31 ENCOUNTER — Ambulatory Visit (INDEPENDENT_AMBULATORY_CARE_PROVIDER_SITE_OTHER): Payer: BC Managed Care – PPO | Admitting: Sports Medicine

## 2020-01-31 ENCOUNTER — Encounter: Payer: Self-pay | Admitting: Sports Medicine

## 2020-01-31 VITALS — BP 136/74 | HR 79 | Ht 75.0 in | Wt 298.0 lb

## 2020-01-31 DIAGNOSIS — Z0000 Encounter for general adult medical examination without abnormal findings: Secondary | ICD-10-CM

## 2020-01-31 DIAGNOSIS — I10 Essential (primary) hypertension: Secondary | ICD-10-CM | POA: Diagnosis not present

## 2020-01-31 DIAGNOSIS — Z1211 Encounter for screening for malignant neoplasm of colon: Secondary | ICD-10-CM

## 2020-01-31 NOTE — Assessment & Plan Note (Signed)
Blood pressure is elevated, he did just get off of a night shift, it did improve on recheck. Continue valsartan/hydrochlorothiazide as well as amlodipine.

## 2020-01-31 NOTE — Assessment & Plan Note (Signed)
Annual physical as above. Checking routine labs, adding colon cancer screening.

## 2020-01-31 NOTE — Progress Notes (Signed)
Subjective:    CC: Annual Physical Exam  HPI:  This patient is here for their annual physical  I reviewed the past medical history, family history, social history, surgical history, and allergies today and no changes were needed.  Please see the problem list section below in epic for further details.  Past Medical History: Past Medical History:  Diagnosis Date  . Hyperlipidemia   . Hypertension   . Seasonal allergies   . Transaminitis    Past Surgical History: Past Surgical History:  Procedure Laterality Date  . BICEPT TENODESIS Left 10/06/2018   Procedure: BICEPS TENODESIS;  Surgeon: Hiram Gash, MD;  Location: [facility];  Service: Orthopedics;  Laterality: Left;  . HERNIA REPAIR    . KNEE ARTHROSCOPY    . ROTATOR CUFF REPAIR    . SHOULDER ARTHROSCOPY WITH ROTATOR CUFF REPAIR AND SUBACROMIAL DECOMPRESSION Left 10/06/2018   Procedure: LEFT SHOULDER ARTHROSCOPY WITH ROTATOR CUFF REPAIR AND SUBACROMIAL DECOMPRESSION, EXTENSIVE DEBRIDEMENT, BICEP TENODESIS;  Surgeon: Hiram Gash, MD;  Location: [facility];  Service: Orthopedics;  Laterality: Left;  BLOCK   Social History: Social History   Socioeconomic History  . Marital status: Single    Spouse name: Not on file  . Number of children: 3  . Years of education: Not on file  . Highest education level: Not on file  Occupational History  . Not on file  Tobacco Use  . Smoking status: Never Smoker  . Smokeless tobacco: Never Used  Vaping Use  . Vaping Use: Never used  Substance and Sexual Activity  . Alcohol use: Yes    Alcohol/week: 3.0 standard drinks    Types: 3 Standard drinks or equivalent per week    Comment: Occasional  . Drug use: No  . Sexual activity: Not on file  Other Topics Concern  . Not on file  Social History Narrative  . Not on file   Social Determinants of Health   Financial Resource Strain: Not on file  Food Insecurity: Not on file  Transportation Needs: Not on  file  Physical Activity: Not on file  Stress: Not on file  Social Connections: Not on file   Family History: Family History  Problem Relation Age of Onset  . Cancer Father        prostate  . Breast cancer Paternal Grandmother    Allergies: Allergies  Allergen Reactions  . Ace Inhibitors Cough   Medications: See med rec.  Review of Systems: No headache, visual changes, nausea, vomiting, diarrhea, constipation, dizziness, abdominal pain, skin rash, fevers, chills, night sweats, swollen lymph nodes, weight loss, chest pain, body aches, joint swelling, muscle aches, shortness of breath, mood changes, visual or auditory hallucinations.  Objective:    General: Well Developed, well nourished, and in no acute distress.  Neuro: Alert and oriented x3, extra-ocular muscles intact, sensation grossly intact. Cranial nerves II through XII are intact, motor, sensory, and coordinative functions are all intact. HEENT: Normocephalic, atraumatic, pupils equal round reactive to light, neck supple, no masses, no lymphadenopathy, thyroid nonpalpable. Oropharynx, nasopharynx, external ear canals are unremarkable. Skin: Warm and dry, no rashes noted.  Cardiac: Regular rate and rhythm, no murmurs rubs or gallops.  Respiratory: Clear to auscultation bilaterally. Not using accessory muscles, speaking in full sentences.  Abdominal: Soft, nontender, nondistended, positive bowel sounds, no masses, no organomegaly.  Musculoskeletal: Shoulder, elbow, wrist, hip, knee, ankle stable, and with full range of motion.  Impression and Recommendations:    The patient  was counselled, risk factors were discussed, anticipatory guidance given.  Annual physical exam Annual physical as above. Checking routine labs, adding colon cancer screening.   Essential hypertension, benign Blood pressure is elevated, he did just get off of a night shift, it did improve on recheck. Continue valsartan/hydrochlorothiazide as well as  amlodipine.   ___________________________________________ Gwen Her. Dianah Field, M.D., ABFM., CAQSM. Primary Care and Sports Medicine [facility]  Adjunct Professor of [place] of [facility] of Medicine

## 2020-02-01 LAB — CBC
HCT: 40.6 % (ref 38.5–50.0)
Hemoglobin: 13.4 g/dL (ref 13.2–17.1)
MCH: 27.6 pg (ref 27.0–33.0)
MCHC: 33 g/dL (ref 32.0–36.0)
MCV: 83.5 fL (ref 80.0–100.0)
MPV: 11.7 fL (ref 7.5–12.5)
Platelets: 198 10*3/uL (ref 140–400)
RBC: 4.86 10*6/uL (ref 4.20–5.80)
RDW: 13.9 % (ref 11.0–15.0)
WBC: 4.4 10*3/uL (ref 3.8–10.8)

## 2020-02-01 LAB — COMPREHENSIVE METABOLIC PANEL
AG Ratio: 1.6 (calc) (ref 1.0–2.5)
ALT: 77 U/L — ABNORMAL HIGH (ref 9–46)
AST: 42 U/L — ABNORMAL HIGH (ref 10–40)
Albumin: 4.4 g/dL (ref 3.6–5.1)
Alkaline phosphatase (APISO): 58 U/L (ref 36–130)
BUN: 20 mg/dL (ref 7–25)
CO2: 28 mmol/L (ref 20–32)
Calcium: 9 mg/dL (ref 8.6–10.3)
Chloride: 100 mmol/L (ref 98–110)
Creat: 1.09 mg/dL (ref 0.60–1.35)
Globulin: 2.8 g/dL (calc) (ref 1.9–3.7)
Glucose, Bld: 90 mg/dL (ref 65–99)
Potassium: 4 mmol/L (ref 3.5–5.3)
Sodium: 139 mmol/L (ref 135–146)
Total Bilirubin: 0.4 mg/dL (ref 0.2–1.2)
Total Protein: 7.2 g/dL (ref 6.1–8.1)

## 2020-02-01 LAB — LIPID PANEL
Cholesterol: 213 mg/dL — ABNORMAL HIGH (ref <200)
HDL: 40 mg/dL (ref >=40)
LDL Cholesterol (Calc): 132 mg/dL (calc) — ABNORMAL HIGH
Non-HDL Cholesterol (Calc): 173 mg/dL (calc) — ABNORMAL HIGH (ref <130)
Total CHOL/HDL Ratio: 5.3 (calc) — ABNORMAL HIGH (ref <5.0)
Triglycerides: 266 mg/dL — ABNORMAL HIGH (ref <150)

## 2020-02-01 LAB — HEMOGLOBIN A1C
Hgb A1c MFr Bld: 5.9 % of total Hgb — ABNORMAL HIGH (ref <5.7)
Mean Plasma Glucose: 123 mg/dL
eAG (mmol/L): 6.8 mmol/L

## 2020-02-01 LAB — TSH: TSH: 1.17 mIU/L (ref 0.40–4.50)

## 2020-02-14 DIAGNOSIS — Z1211 Encounter for screening for malignant neoplasm of colon: Secondary | ICD-10-CM | POA: Diagnosis not present

## 2020-02-14 LAB — COLOGUARD: Cologuard: POSITIVE — AB

## 2020-02-15 ENCOUNTER — Encounter: Payer: Self-pay | Admitting: Sports Medicine

## 2020-02-15 ENCOUNTER — Other Ambulatory Visit: Payer: Self-pay

## 2020-02-15 ENCOUNTER — Ambulatory Visit (INDEPENDENT_AMBULATORY_CARE_PROVIDER_SITE_OTHER): Payer: BC Managed Care – PPO | Admitting: Sports Medicine

## 2020-02-15 DIAGNOSIS — R195 Other fecal abnormalities: Secondary | ICD-10-CM

## 2020-02-15 DIAGNOSIS — I10 Essential (primary) hypertension: Secondary | ICD-10-CM

## 2020-02-15 NOTE — Progress Notes (Addendum)
    Procedures performed today:    None.  Independent interpretation of notes and tests performed by another provider:   None.  Brief History, Exam, Impression, and Recommendations:    Essential hypertension, benign Doing returns, blood pressure is historically well controlled, he did some celebrating, and the next day experienced an episode of what sounds to be orthostatic hypotension where he was standing in place for a while, he felt a little bit lightheaded like he was going to pass out and felt a wave of an expectable sensation to through his body, no chest pain, palpitations, shortness of breath, he did not lose consciousness. I have advised him to maintain his hydration and be very cognizant of his symptoms, this only happened once before several months prior. I will see any need for further intervention at this juncture. I explained the function of the veins in the legs, valves, as well as the pathophysiology of orthostatic hypotension. He will keep himself well-hydrated particularly after nights of celebration.  Positive colorectal cancer screening using Cologuard test Positive Cologuard, referral to gastroenterology.    ___________________________________________ Gwen Her. Dianah Field, M.D., ABFM., CAQSM. Primary Care and [place] Instructor of [place] of [facility] of Medicine

## 2020-02-15 NOTE — Assessment & Plan Note (Signed)
Doing returns, blood pressure is historically well controlled, he did some celebrating, and the next day experienced an episode of what sounds to be orthostatic hypotension where he was standing in place for a while, he felt a little bit lightheaded like he was going to pass out and felt a wave of an expectable sensation to through his body, no chest pain, palpitations, shortness of breath, he did not lose consciousness. I have advised him to maintain his hydration and be very cognizant of his symptoms, this only happened once before several months prior. I will see any need for further intervention at this juncture. I explained the function of the veins in the legs, valves, as well as the pathophysiology of orthostatic hypotension. He will keep himself well-hydrated particularly after nights of celebration.

## 2020-02-15 NOTE — Patient Instructions (Signed)

## 2020-02-20 ENCOUNTER — Other Ambulatory Visit: Payer: Self-pay | Admitting: Sports Medicine

## 2020-02-20 DIAGNOSIS — N529 Male erectile dysfunction, unspecified: Secondary | ICD-10-CM

## 2020-02-23 LAB — COLOGUARD
COLOGUARD: POSITIVE — AB
Cologuard: POSITIVE — AB

## 2020-02-26 ENCOUNTER — Telehealth: Payer: Self-pay

## 2020-02-26 NOTE — Telephone Encounter (Signed)
[facility] called because the fax failed to send on a positive result. Updated fax number and and [facility] [facility] to re-fax results.

## 2020-02-27 ENCOUNTER — Encounter: Payer: Self-pay | Admitting: Sports Medicine

## 2020-02-27 DIAGNOSIS — R195 Other fecal abnormalities: Secondary | ICD-10-CM | POA: Insufficient documentation

## 2020-02-27 NOTE — Addendum Note (Signed)
Addended by: Silverio Decamp on: 02/27/2020 09:59 AM   Modules accepted: Orders

## 2020-02-27 NOTE — Telephone Encounter (Signed)
Left message for a return call from patient.  Results abstracted into chart.

## 2020-02-27 NOTE — Assessment & Plan Note (Signed)
Positive Cologuard, referral to gastroenterology.

## 2020-02-29 NOTE — Telephone Encounter (Signed)
Patient aware of results and the need to follow up with full colonoscopy. He stated they had called and left message. Stressed how important it was to have colonoscopy to visualize colon after a positive result on cologuard. Patient verbalized understanding.

## 2020-04-03 ENCOUNTER — Encounter: Payer: Self-pay | Admitting: Sports Medicine

## 2020-04-03 ENCOUNTER — Ambulatory Visit (INDEPENDENT_AMBULATORY_CARE_PROVIDER_SITE_OTHER): Payer: BC Managed Care – PPO

## 2020-04-03 ENCOUNTER — Other Ambulatory Visit: Payer: Self-pay

## 2020-04-03 ENCOUNTER — Ambulatory Visit: Payer: BC Managed Care – PPO | Admitting: Sports Medicine

## 2020-04-03 DIAGNOSIS — G8929 Other chronic pain: Secondary | ICD-10-CM

## 2020-04-03 DIAGNOSIS — M79605 Pain in left leg: Secondary | ICD-10-CM | POA: Diagnosis not present

## 2020-04-03 DIAGNOSIS — M23307 Other meniscus derangements, unspecified meniscus, left knee: Secondary | ICD-10-CM | POA: Insufficient documentation

## 2020-04-03 DIAGNOSIS — M1711 Unilateral primary osteoarthritis, right knee: Secondary | ICD-10-CM | POA: Diagnosis not present

## 2020-04-03 DIAGNOSIS — R600 Localized edema: Secondary | ICD-10-CM | POA: Diagnosis not present

## 2020-04-03 DIAGNOSIS — M25562 Pain in left knee: Secondary | ICD-10-CM

## 2020-04-03 DIAGNOSIS — M79662 Pain in left lower leg: Secondary | ICD-10-CM | POA: Diagnosis not present

## 2020-04-03 DIAGNOSIS — M1712 Unilateral primary osteoarthritis, left knee: Secondary | ICD-10-CM | POA: Diagnosis not present

## 2020-04-03 DIAGNOSIS — M170 Bilateral primary osteoarthritis of knee: Secondary | ICD-10-CM | POA: Insufficient documentation

## 2020-04-03 MED ORDER — ACETAMINOPHEN ER 650 MG PO TBCR: 650.0000 mg | EXTENDED_RELEASE_TABLET | Freq: Three times a day (TID) | ORAL | 3 refills | Status: DC | PRN

## 2020-04-03 NOTE — Progress Notes (Signed)
    Procedures performed today:    None.  Independent interpretation of notes and tests performed by another provider:   None.  Brief History, Exam, Impression, and Recommendations:    Chronic pain of left knee This is a pleasant 50 year old male, he has a several week history of pain in the posterior aspect of his left knee, no overt mechanical symptoms, occasional swelling. His exam is fairly benign, no effusion, minimal tenderness at the posterior joint lines, negative Homans' sign. We will get some x-rays, a DVT ultrasound, he will continue ibuprofen, adding arthritis strength Tylenol, reaction knee brace, as well as arthritis conditioning exercises. Return to see me in 3 to 4 weeks, we can do an injection if no better.    ___________________________________________ Gwen Her. Dianah Field, M.D., ABFM., CAQSM. Primary Care and [place] Instructor of [facility] of [facility]

## 2020-04-03 NOTE — Assessment & Plan Note (Signed)
This is a pleasant 50 year old male, he has a several week history of pain in the posterior aspect of his left knee, no overt mechanical symptoms, occasional swelling. His exam is fairly benign, no effusion, minimal tenderness at the posterior joint lines, negative Homans' sign. We will get some x-rays, a DVT ultrasound, he will continue ibuprofen, adding arthritis strength Tylenol, reaction knee brace, as well as arthritis conditioning exercises. Return to see me in 3 to 4 weeks, we can do an injection if no better.

## 2020-04-17 ENCOUNTER — Ambulatory Visit: Payer: BC Managed Care – PPO | Admitting: Internal Medicine

## 2020-04-17 ENCOUNTER — Encounter: Payer: Self-pay | Admitting: Internal Medicine

## 2020-04-17 VITALS — BP 132/86 | HR 80 | Ht 75.0 in | Wt 298.6 lb

## 2020-04-17 DIAGNOSIS — R195 Other fecal abnormalities: Secondary | ICD-10-CM | POA: Diagnosis not present

## 2020-04-17 DIAGNOSIS — K219 Gastro-esophageal reflux disease without esophagitis: Secondary | ICD-10-CM

## 2020-04-17 DIAGNOSIS — R7989 Other specified abnormal findings of blood chemistry: Secondary | ICD-10-CM | POA: Diagnosis not present

## 2020-04-17 MED ORDER — SUTAB 1479-225-188 MG PO TABS: 1.0000 | ORAL_TABLET | Freq: Once | ORAL | 0 refills | Status: AC

## 2020-04-17 NOTE — Progress Notes (Signed)
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  I saw the patient in April 2016 regarding elevated hepatic transaminases.  After extensive work-up, the etiology was felt to be fatty liver.  See that dictation for details.  As part of a colon cancer screening strategy, patient recently elected Cologuard testing.  This returned positive.  Patient's lower GI review of systems is negative.  No change in bowel habits or bleeding.  Occasional hard stools.  He does have intermittent problems with reflux.  Describes classic symptoms.  No dysphagia..  Review of blood work from January 31, 2020 shows AST of 42 and ALT 77.  Other liver tests normal.  CBC unremarkable with hemoglobin 13.4.  Normal platelets.  Elevated lipids.  Hemoglobin A1c 5.9.  REVIEW OF SYSTEMS:  All non-GI ROS negative unless otherwise stated in the HPI except for muscle cramps, fatigue  Past Medical History:  Diagnosis Date  . Arthritis   . Hyperlipidemia   . Hypertension   . Seasonal allergies   . Transaminitis     Past Surgical History:  Procedure Laterality Date  . BICEPT TENODESIS Left 10/06/2018   Procedure: BICEPS TENODESIS;  Surgeon: Hiram Gash, MD;  Location: [facility];  Service: Orthopedics;  Laterality: Left;  . HERNIA REPAIR    . KNEE ARTHROSCOPY Right    x 2  . ROTATOR CUFF REPAIR Bilateral   . SHOULDER ARTHROSCOPY WITH ROTATOR CUFF REPAIR AND SUBACROMIAL DECOMPRESSION Left 10/06/2018   Procedure: LEFT SHOULDER ARTHROSCOPY WITH ROTATOR CUFF REPAIR AND SUBACROMIAL DECOMPRESSION, EXTENSIVE DEBRIDEMENT, BICEP TENODESIS;  Surgeon: Hiram Gash, MD;  Location: [facility];  Service: Orthopedics;  Laterality: Left;  BLOCK    Social History Barnell Shieh  reports that he has been smoking. He has never used smokeless tobacco. He  reports current alcohol use of about 3.0 standard drinks of alcohol per week. He reports that he does not use drugs.  family history includes Breast cancer in his paternal grandmother; Diverticulitis in his father; Prostate cancer in his father.  Allergies  Allergen Reactions  . Ace Inhibitors Cough       PHYSICAL EXAMINATION: Vital signs: BP 132/86   Pulse 80   Ht 6' 3"  (1.905 m)   Wt 298 lb 9.6 oz (135.4 kg)   BMI 37.32 kg/m   Constitutional: generally well-appearing, no acute distress Psychiatric: alert and oriented x3, cooperative Eyes: extraocular movements intact, anicteric, conjunctiva pink Mouth: oral pharynx moist, no lesions Neck: supple no lymphadenopathy Cardiovascular: heart regular rate and rhythm, no murmur Lungs: clear to auscultation bilaterally Abdomen: soft, nontender, nondistended, no obvious ascites, no peritoneal signs, normal bowel sounds, no organomegaly Rectal: Deferred until colonoscopy Extremities: no clubbing, cyanosis, or lower extremity edema bilaterally Skin: no lesions on visible extremities Neuro: No focal deficits. No asterixis.    ASSESSMENT:  1.  Positive Cologuard testing.  Rule out neoplasia 2.  Mild elevation of hepatic transaminases.  Fatty liver 3.  GERD without alarm features   PLAN:  1.  Exercise and weight loss 2.  Acid reducing agent on demand.  May use PPI or H2 receptor antagonist. 3.  Schedule colonoscopy.The nature of the procedure, as well as the risks, benefits, and alternatives were carefully and thoroughly reviewed with the patient. Ample time for discussion and questions allowed. The patient understood, was satisfied, and agreed  to proceed.

## 2020-04-17 NOTE — Patient Instructions (Signed)
If you are age 50 or older, your body mass index should be between 23-30. Your Body mass index is 37.32 kg/m. If this is out of the aforementioned range listed, please consider follow up with your Primary Care Provider.  If you are age 25 or younger, your body mass index should be between 19-25. Your Body mass index is 37.32 kg/m. If this is out of the aformentioned range listed, please consider follow up with your Primary Care Provider.   You have been scheduled for a colonoscopy. Please follow written instructions given to you at your visit today.  Please pick up your prep supplies at the pharmacy within the next 1-3 days. If you use inhalers (even only as needed), please bring them with you on the day of your procedure.

## 2020-05-01 ENCOUNTER — Other Ambulatory Visit: Payer: Self-pay

## 2020-05-01 ENCOUNTER — Ambulatory Visit (INDEPENDENT_AMBULATORY_CARE_PROVIDER_SITE_OTHER): Payer: BC Managed Care – PPO

## 2020-05-01 ENCOUNTER — Ambulatory Visit: Payer: BC Managed Care – PPO | Admitting: Sports Medicine

## 2020-05-01 DIAGNOSIS — G8929 Other chronic pain: Secondary | ICD-10-CM

## 2020-05-01 DIAGNOSIS — M1712 Unilateral primary osteoarthritis, left knee: Secondary | ICD-10-CM | POA: Diagnosis not present

## 2020-05-01 DIAGNOSIS — M25562 Pain in left knee: Secondary | ICD-10-CM

## 2020-05-01 NOTE — Assessment & Plan Note (Signed)
Osteoarthritis noted on x-rays, persistent pain, failed conservative treatment, injected today, rehab exercises given, return to see me in a month. Viscosupplementation if no better.

## 2020-05-01 NOTE — Progress Notes (Signed)
    Procedures performed today:    Procedure: Real-time Ultrasound Guided injection of the left knee Device: Samsung HS60  Verbal informed consent obtained.  Time-out conducted.  Noted no overlying erythema, induration, or other signs of local infection.  Skin prepped in a sterile fashion.  Local anesthesia: Topical Ethyl chloride.  With sterile technique and under real time ultrasound guidance:  Noted trace effusion, 1 cc Kenalog 40, 2 cc lidocaine, 2 cc bupivacaine injected easily Completed without difficulty  Advised to call if fevers/chills, erythema, induration, drainage, or persistent bleeding.  Images permanently stored and available for review in PACS.  Impression: Technically successful ultrasound guided injection.  Independent interpretation of notes and tests performed by another provider:   None.  Brief History, Exam, Impression, and Recommendations:    Primary osteoarthritis of left knee Osteoarthritis noted on x-rays, persistent pain, failed conservative treatment, injected today, rehab exercises given, return to see me in a month. Viscosupplementation if no better.    ___________________________________________ Gwen Her. Dianah Field, M.D., ABFM., CAQSM. Primary Care and [place] Instructor of [place] of [facility] of Medicine

## 2020-05-29 ENCOUNTER — Telehealth: Payer: Self-pay | Admitting: Sports Medicine

## 2020-05-29 ENCOUNTER — Ambulatory Visit: Payer: BC Managed Care – PPO | Admitting: Sports Medicine

## 2020-05-29 ENCOUNTER — Other Ambulatory Visit: Payer: Self-pay

## 2020-05-29 DIAGNOSIS — M25562 Pain in left knee: Secondary | ICD-10-CM

## 2020-05-29 DIAGNOSIS — G8929 Other chronic pain: Secondary | ICD-10-CM | POA: Diagnosis not present

## 2020-05-29 NOTE — Progress Notes (Addendum)
    Procedures performed today:    None.  Independent interpretation of notes and tests performed by another provider:   None.  Brief History, Exam, Impression, and Recommendations:    Chronic knee pain Jerry Escobar has a long history of left knee pain, he has history of a right knee scope x2 years ago. We completed conservative treatment, greater than 6 weeks including activity modification NSAIDs, injection at the last visit. He has had some improvement but continues to have significant discomfort. At this juncture we need to determine whether viscosupplementation is the next course of action. Proceeding with MRI to evaluate for meniscal tearing, if he does have a large amount of meniscal tear we will refer for arthroscopy followed by viscosupplementation here. If he does not have significant meniscal tearing we will proceed with viscosupplementation here, also to work on getting him approved.  Significant meniscal tearing seen, referral to Jerry Escobar.    ___________________________________________ Jerry Escobar. Jerry Escobar, M.D., ABFM., CAQSM. Primary Care and [place] Instructor of [place] [facility] of Medicine

## 2020-05-29 NOTE — Assessment & Plan Note (Addendum)
Jerry Escobar has a long history of left knee pain, he has history of a right knee scope x2 years ago. We completed conservative treatment, greater than 6 weeks including activity modification NSAIDs, injection at the last visit. He has had some improvement but continues to have significant discomfort. At this juncture we need to determine whether viscosupplementation is the next course of action. Proceeding with MRI to evaluate for meniscal tearing, if he does have a large amount of meniscal tear we will refer for arthroscopy followed by viscosupplementation here. If he does not have significant meniscal tearing we will proceed with viscosupplementation here, also to work on getting him approved.  Significant meniscal tearing seen, referral to Dr. Berenice Primas.

## 2020-05-29 NOTE — Telephone Encounter (Signed)
Please get Jerry Escobar approved for viscosupplementation, left knee, failed injections, x-ray confirmed.

## 2020-06-03 ENCOUNTER — Other Ambulatory Visit: Payer: Self-pay

## 2020-06-03 ENCOUNTER — Ambulatory Visit (INDEPENDENT_AMBULATORY_CARE_PROVIDER_SITE_OTHER): Payer: BC Managed Care – PPO

## 2020-06-03 DIAGNOSIS — G8929 Other chronic pain: Secondary | ICD-10-CM | POA: Diagnosis not present

## 2020-06-03 DIAGNOSIS — M25562 Pain in left knee: Secondary | ICD-10-CM | POA: Diagnosis not present

## 2020-06-03 DIAGNOSIS — M23352 Other meniscus derangements, posterior horn of lateral meniscus, left knee: Secondary | ICD-10-CM | POA: Diagnosis not present

## 2020-06-03 DIAGNOSIS — M1712 Unilateral primary osteoarthritis, left knee: Secondary | ICD-10-CM | POA: Diagnosis not present

## 2020-06-04 NOTE — Addendum Note (Signed)
Addended by: Silverio Decamp on: 06/04/2020 11:03 AM   Modules accepted: Orders

## 2020-06-10 NOTE — Telephone Encounter (Signed)
Sent through [facility] waiting on BID and to see if PA is required. - CF

## 2020-06-20 NOTE — Telephone Encounter (Signed)
Received BID patient has met his deductible so he responsible for a copay of 40.00 per visit once his out of pocket is met he will be covered at 100% Prior Josem Kaufmann is not required. I called patient on 5/24 and left him a voicemail to give him this information and have not received a call back. I will call again. - CF

## 2020-06-27 ENCOUNTER — Telehealth: Payer: Self-pay | Admitting: Internal Medicine

## 2020-06-27 NOTE — Telephone Encounter (Signed)
Good morning Dr. Henrene Pastor, patient called stating he just came [place] from being [place] of the [place] where he unfortunately he tested positive for covid so he rescheduled his procedure from 06/28/20 to 09/12/20.

## 2020-06-28 ENCOUNTER — Encounter: Payer: BC Managed Care – PPO | Admitting: Internal Medicine

## 2020-07-02 DIAGNOSIS — S83282A Other tear of lateral meniscus, current injury, left knee, initial encounter: Secondary | ICD-10-CM | POA: Diagnosis not present

## 2020-07-12 ENCOUNTER — Other Ambulatory Visit: Payer: Self-pay | Admitting: Sports Medicine

## 2020-07-12 DIAGNOSIS — N529 Male erectile dysfunction, unspecified: Secondary | ICD-10-CM

## 2020-07-23 ENCOUNTER — Telehealth: Payer: Self-pay

## 2020-07-23 NOTE — Telephone Encounter (Signed)
Patient called wanting an appt with Dr. Darene Lamer for Friday to discuss upcoming surgery prep.

## 2020-07-23 NOTE — Telephone Encounter (Signed)
Patient has been scheduled with Dr T for Friday. Am

## 2020-07-26 ENCOUNTER — Ambulatory Visit: Payer: BC Managed Care – PPO | Admitting: Sports Medicine

## 2020-07-26 ENCOUNTER — Other Ambulatory Visit: Payer: Self-pay

## 2020-07-26 ENCOUNTER — Encounter: Payer: Self-pay | Admitting: Sports Medicine

## 2020-07-26 DIAGNOSIS — M23307 Other meniscus derangements, unspecified meniscus, left knee: Secondary | ICD-10-CM

## 2020-07-26 MED ORDER — TRAMADOL HCL 50 MG PO TABS: 50.0000 mg | ORAL_TABLET | Freq: Three times a day (TID) | ORAL | 0 refills | Status: DC | PRN

## 2020-07-26 NOTE — Assessment & Plan Note (Addendum)
Jerry Escobar returns, he is a pleasant 50 year old male, long history of left knee pain, history of a right knee scope x2 several years ago. After failure of conservative treatment including NSAIDs, injections, activity modification, bracing we obtained an MRI that showed significant meniscal tearing. In general the approach is arthroscopic debridement of meniscal tearing and viscosupplementation for the arthritic component of the pain. At this juncture he is scheduled for arthroscopic debridement with Dr. Berenice Primas Wednesday of next week, 4 to 6 weeks after that if insufficient improvement we will proceed with viscosupplementation, we do have approval for Orthovisc. He is in significant discomfort, I will postdate his out of work note from this past Sunday through his surgery date Wednesday of next week. Also adding some tramadol for pain.

## 2020-07-26 NOTE — Progress Notes (Signed)
    Procedures performed today:    None.  Independent interpretation of notes and tests performed by another provider:   None.  Brief History, Exam, Impression, and Recommendations:    Degenerative tear of meniscus, left Jerry Escobar returns, he is a pleasant 50 year old male, long history of left knee pain, history of a right knee scope x2 several years ago. After failure of conservative treatment including NSAIDs, injections, activity modification, bracing we obtained an MRI that showed significant meniscal tearing. In general the approach is arthroscopic debridement of meniscal tearing and viscosupplementation for the arthritic component of the pain. At this juncture he is scheduled for arthroscopic debridement with Dr. Berenice Primas Wednesday of next week, 4 to 6 weeks after that if insufficient improvement we will proceed with viscosupplementation, we do have approval for Orthovisc. He is in significant discomfort, I will postdate his out of work note from this past Sunday through his surgery date Wednesday of next week. Also adding some tramadol for pain.    ___________________________________________ Gwen Her. Dianah Field, M.D., ABFM., CAQSM. Primary Care and [place] Instructor of [place] of [facility] of Medicine

## 2020-07-31 DIAGNOSIS — Y999 Unspecified external cause status: Secondary | ICD-10-CM | POA: Diagnosis not present

## 2020-07-31 DIAGNOSIS — M1712 Unilateral primary osteoarthritis, left knee: Secondary | ICD-10-CM | POA: Diagnosis not present

## 2020-07-31 DIAGNOSIS — G8918 Other acute postprocedural pain: Secondary | ICD-10-CM | POA: Diagnosis not present

## 2020-07-31 DIAGNOSIS — M2342 Loose body in knee, left knee: Secondary | ICD-10-CM | POA: Diagnosis not present

## 2020-07-31 DIAGNOSIS — M94262 Chondromalacia, left knee: Secondary | ICD-10-CM | POA: Diagnosis not present

## 2020-07-31 DIAGNOSIS — S83282A Other tear of lateral meniscus, current injury, left knee, initial encounter: Secondary | ICD-10-CM | POA: Diagnosis not present

## 2020-07-31 DIAGNOSIS — X58XXXA Exposure to other specified factors, initial encounter: Secondary | ICD-10-CM | POA: Diagnosis not present

## 2020-07-31 DIAGNOSIS — S83272A Complex tear of lateral meniscus, current injury, left knee, initial encounter: Secondary | ICD-10-CM | POA: Diagnosis not present

## 2020-08-02 ENCOUNTER — Telehealth: Payer: Self-pay

## 2020-08-02 MED ORDER — IBUPROFEN 800 MG PO TABS: 800.0000 mg | ORAL_TABLET | Freq: Three times a day (TID) | ORAL | 2 refills | Status: DC | PRN

## 2020-08-02 NOTE — Telephone Encounter (Signed)
No problem, sending in now.

## 2020-08-02 NOTE — Telephone Encounter (Signed)
Patient called and wanted to know if you would give him some more Ibu 874m to help combat the inflammation and pain after surgery. I asked patient if he had asked his surgeon for this and he said he had not. He reports using the Hydrocodone the surgeon gives him alternating with the ibuprofen 8037mto control the pain.  He stated that he still had some one hand when I told him you were out of the office and would not return until July 19th. He said that was fine.

## 2020-08-05 NOTE — Telephone Encounter (Signed)
Patient aware prescription sent in.

## 2020-08-06 DIAGNOSIS — S83272D Complex tear of lateral meniscus, current injury, left knee, subsequent encounter: Secondary | ICD-10-CM | POA: Diagnosis not present

## 2020-08-06 DIAGNOSIS — M2242 Chondromalacia patellae, left knee: Secondary | ICD-10-CM | POA: Diagnosis not present

## 2020-08-09 DIAGNOSIS — M2242 Chondromalacia patellae, left knee: Secondary | ICD-10-CM | POA: Diagnosis not present

## 2020-08-09 DIAGNOSIS — S83272D Complex tear of lateral meniscus, current injury, left knee, subsequent encounter: Secondary | ICD-10-CM | POA: Diagnosis not present

## 2020-08-12 DIAGNOSIS — M2242 Chondromalacia patellae, left knee: Secondary | ICD-10-CM | POA: Diagnosis not present

## 2020-08-12 DIAGNOSIS — S83272D Complex tear of lateral meniscus, current injury, left knee, subsequent encounter: Secondary | ICD-10-CM | POA: Diagnosis not present

## 2020-08-14 DIAGNOSIS — M2242 Chondromalacia patellae, left knee: Secondary | ICD-10-CM | POA: Diagnosis not present

## 2020-08-14 DIAGNOSIS — S83272D Complex tear of lateral meniscus, current injury, left knee, subsequent encounter: Secondary | ICD-10-CM | POA: Diagnosis not present

## 2020-08-20 DIAGNOSIS — M25462 Effusion, left knee: Secondary | ICD-10-CM | POA: Diagnosis not present

## 2020-08-20 DIAGNOSIS — Z9889 Other specified postprocedural states: Secondary | ICD-10-CM | POA: Diagnosis not present

## 2020-08-20 DIAGNOSIS — M2242 Chondromalacia patellae, left knee: Secondary | ICD-10-CM | POA: Diagnosis not present

## 2020-08-20 DIAGNOSIS — S83272D Complex tear of lateral meniscus, current injury, left knee, subsequent encounter: Secondary | ICD-10-CM | POA: Diagnosis not present

## 2020-08-22 DIAGNOSIS — M2242 Chondromalacia patellae, left knee: Secondary | ICD-10-CM | POA: Diagnosis not present

## 2020-08-22 DIAGNOSIS — S83272D Complex tear of lateral meniscus, current injury, left knee, subsequent encounter: Secondary | ICD-10-CM | POA: Diagnosis not present

## 2020-08-27 DIAGNOSIS — M2242 Chondromalacia patellae, left knee: Secondary | ICD-10-CM | POA: Diagnosis not present

## 2020-08-27 DIAGNOSIS — S83272D Complex tear of lateral meniscus, current injury, left knee, subsequent encounter: Secondary | ICD-10-CM | POA: Diagnosis not present

## 2020-08-29 ENCOUNTER — Ambulatory Visit (AMBULATORY_SURGERY_CENTER): Payer: Self-pay

## 2020-08-29 ENCOUNTER — Other Ambulatory Visit: Payer: Self-pay

## 2020-08-29 VITALS — Ht 75.0 in | Wt 290.4 lb

## 2020-08-29 DIAGNOSIS — R195 Other fecal abnormalities: Secondary | ICD-10-CM

## 2020-08-29 DIAGNOSIS — M2242 Chondromalacia patellae, left knee: Secondary | ICD-10-CM | POA: Diagnosis not present

## 2020-08-29 DIAGNOSIS — S83272D Complex tear of lateral meniscus, current injury, left knee, subsequent encounter: Secondary | ICD-10-CM | POA: Diagnosis not present

## 2020-08-29 NOTE — Progress Notes (Signed)
No egg or soy allergy known to patient  No issues with past sedation with any surgeries or procedures Patient denies ever being told they had issues or difficulty with intubation  No FH of Malignant Hyperthermia No diet pills per patient No home 02 use per patient  No blood thinners per patient  Pt denies issues with constipation  No A fib or A flutter  EMMI video to pt or via [place] 19 guidelines implemented in [place] today with Pt and RN  Pt is fully vaccinated  for Covid   Patient reports he has sutab prep at home already  Due to the COVID-19 pandemic we are asking patients to follow certain guidelines.  Pt aware of COVID protocols and [facility] guidelines

## 2020-08-31 IMAGING — MR MR HEAD W/O CM
10 series · 48 of 48 positions shown · non-contrast
Comparison: None.

CLINICAL DATA: Left lip numbness and tingling, question lacunar
stroke

EXAM:
MRI HEAD WITHOUT CONTRAST
TECHNIQUE: Multiplanar, multiecho pulse sequences of the brain and surrounding
structures were obtained without intravenous contrast.

[Series 3: DWI · axial · 3.0mm · 1.20mm/px · z∈[-65,+94]mm · 6 of 55 slices shown (1 of 2)]
[im 1/55]
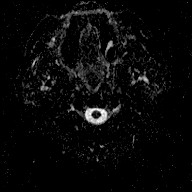
[im 11/55]
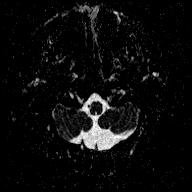
[im 22/55]
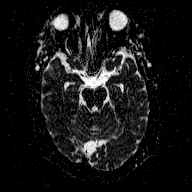
[im 33/55]
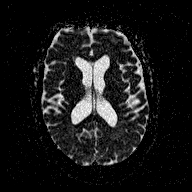
[im 44/55]
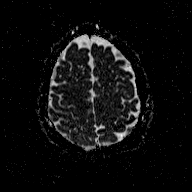
[im 55/55]
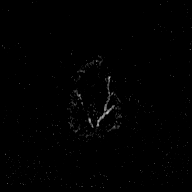

[Series 5: DWI · coronal · 3.0mm · 1.15mm/px · 4 of 48 slices shown (2 of 2)]
[im 1/48]
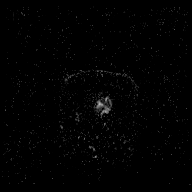
[im 16/48]
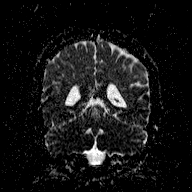
[im 32/48]
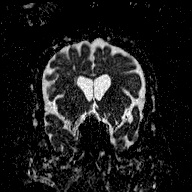
[im 48/48]
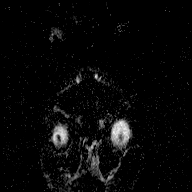

[Series 6: T1 · sagittal · 5.0mm · 0.45mm/px · 2 of 23 slices shown (1 of 2)]
[im 1/23]
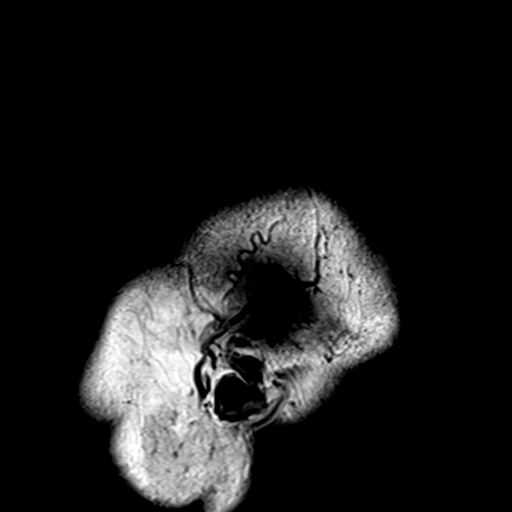
[im 23/23]
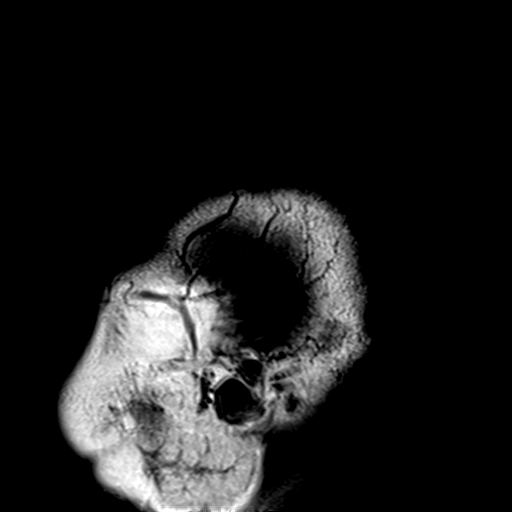

[Series 7: T2 · axial · 5.0mm · 0.72mm/px · z∈[-60,+92]mm · 2 of 23 slices shown (1 of 3)]
[im 1/23]
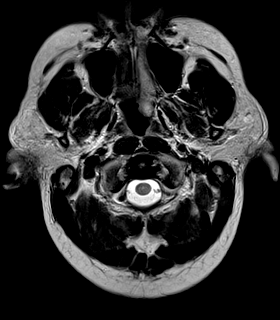
[im 23/23]
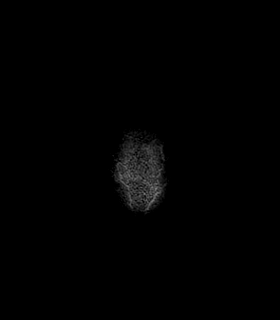

[Series 8: FLAIR · axial · 3.0mm · 0.45mm/px · z∈[-64,+96]mm · 5 of 55 slices shown]
[im 1/55]
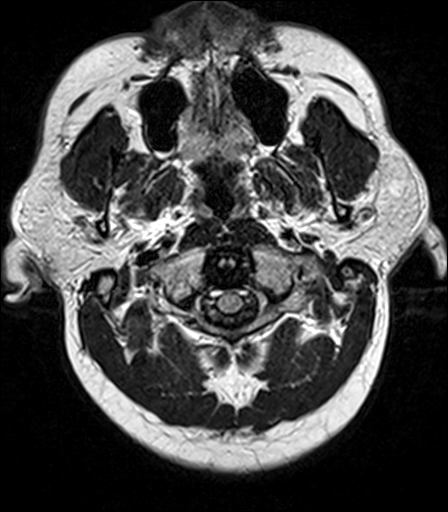
[im 14/55]
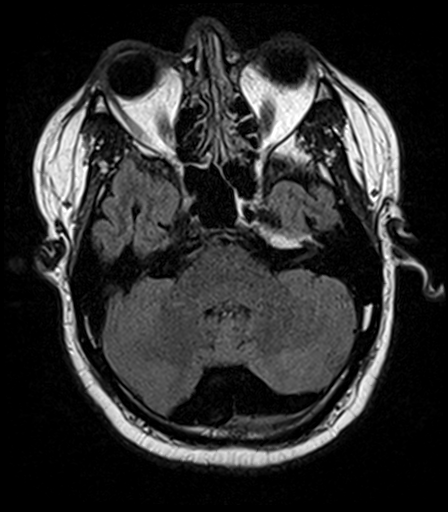
[im 28/55]
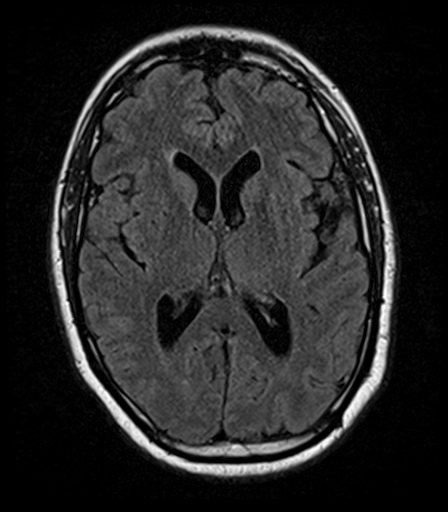
[im 41/55]
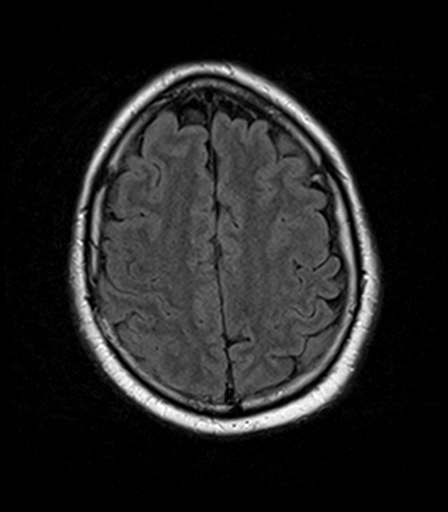
[im 55/55]
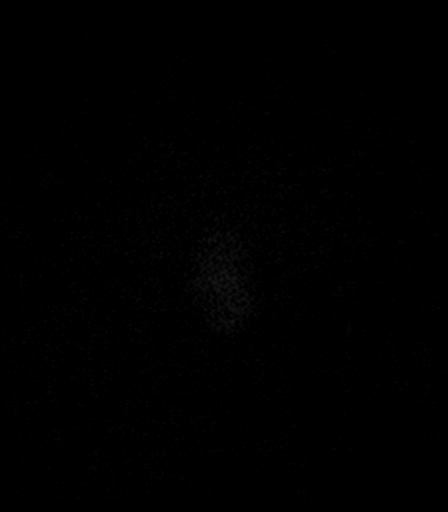

[Series 9: T2 · axial · 5.0mm · 0.72mm/px · z∈[-60,+92]mm · 2 of 23 slices shown (2 of 3)]
[im 1/23]
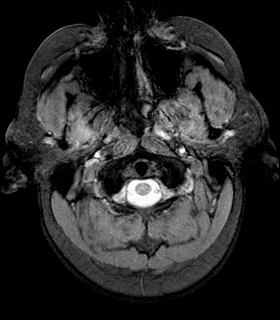
[im 23/23]
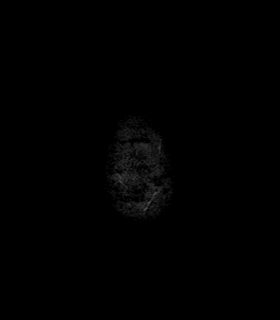

[Series 10: T1 · axial · 1.0mm · 1.00mm/px · z∈[-60,+97]mm · 15 of 160 slices shown (2 of 2)]
[im 1/160]
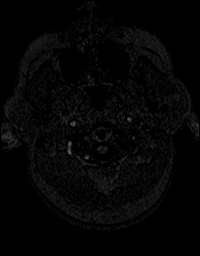
[im 12/160]
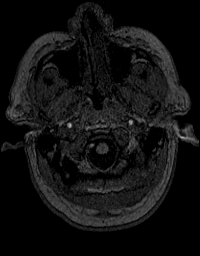
[im 23/160]
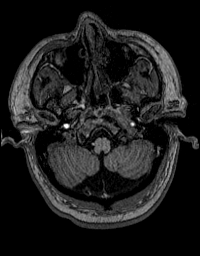
[im 35/160]
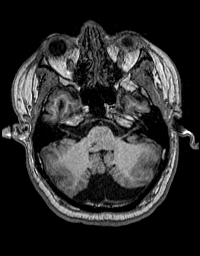
[im 46/160]
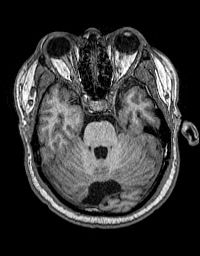
[im 57/160]
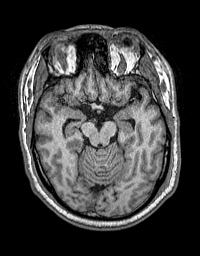
[im 69/160]
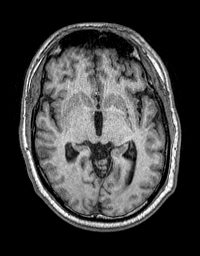
[im 80/160]
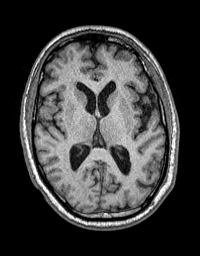
[im 91/160]
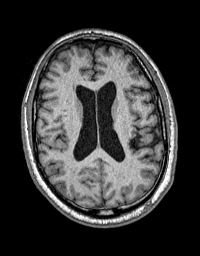
[im 103/160]
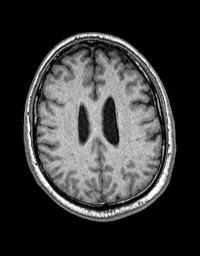
[im 114/160]
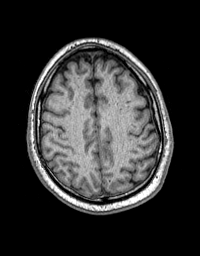
[im 125/160]
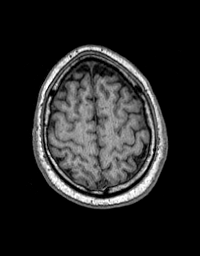
[im 137/160]
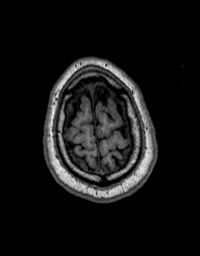
[im 148/160]
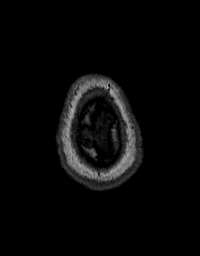
[im 160/160]
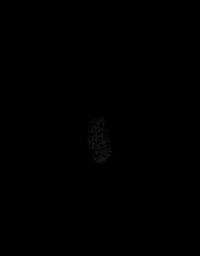

[Series 11: T2 · coronal · 5.0mm · 0.43mm/px · 3 of 29 slices shown (3 of 3)]
[im 1/29]
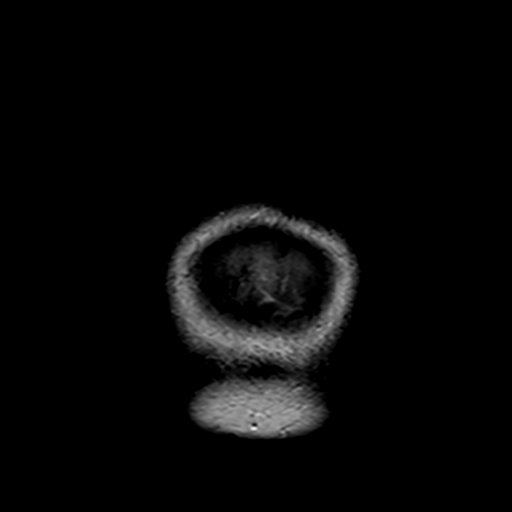
[im 15/29]
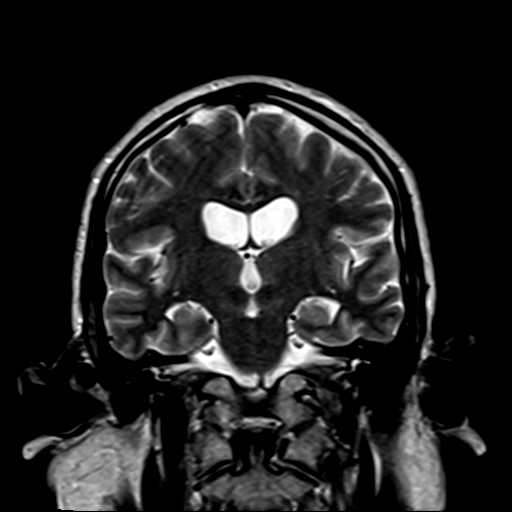
[im 29/29]
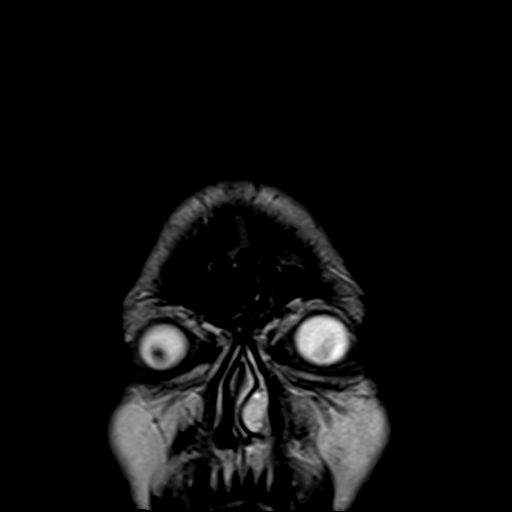

[Series 100: (id) ax · axial · 3.0mm · 1.20mm/px · z∈[-65,+94]mm · 5 of 55 slices shown]
[im 1/55]
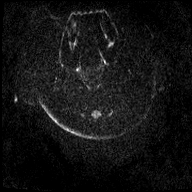
[im 14/55]
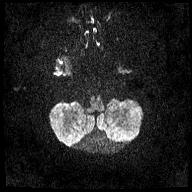
[im 28/55]
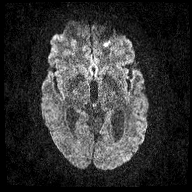
[im 41/55]
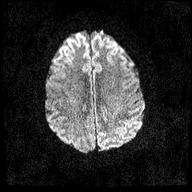
[im 55/55]
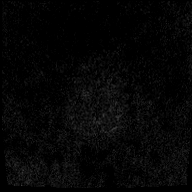

[Series 101: (id) cor · coronal · 3.0mm · 1.15mm/px · 4 of 48 slices shown]
[im 1/48]
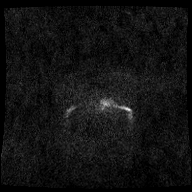
[im 16/48]
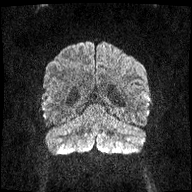
[im 32/48]
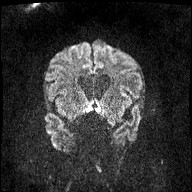
[im 48/48]
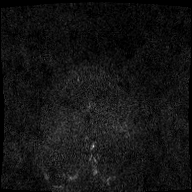

[48 of 48 positions shown; findings below may reference images not displayed]

FINDINGS: Brain: No acute infarction, hemorrhage, hydrocephalus, extra-axial
collection or mass lesion. Mega cisterna magna. Trace FLAIR
hyperintensity around the lateral ventricles; no specific
demyelinating pattern.

Vascular: Normal flow voids

Skull and upper cervical spine: Negative for marrow lesion.

Sinuses/Orbits: Negative.

Other: No findings along the course of the trigeminal nerves.
IMPRESSION: No acute finding including acute or subacute infarct. No explanation
for symptoms.

## 2020-09-02 ENCOUNTER — Ambulatory Visit: Payer: BC Managed Care – PPO | Admitting: Sports Medicine

## 2020-09-03 DIAGNOSIS — M2242 Chondromalacia patellae, left knee: Secondary | ICD-10-CM | POA: Diagnosis not present

## 2020-09-03 DIAGNOSIS — S83272D Complex tear of lateral meniscus, current injury, left knee, subsequent encounter: Secondary | ICD-10-CM | POA: Diagnosis not present

## 2020-09-04 ENCOUNTER — Ambulatory Visit: Payer: BC Managed Care – PPO | Admitting: Sports Medicine

## 2020-09-10 DIAGNOSIS — M2242 Chondromalacia patellae, left knee: Secondary | ICD-10-CM | POA: Diagnosis not present

## 2020-09-10 DIAGNOSIS — S83272D Complex tear of lateral meniscus, current injury, left knee, subsequent encounter: Secondary | ICD-10-CM | POA: Diagnosis not present

## 2020-09-12 ENCOUNTER — Ambulatory Visit (AMBULATORY_SURGERY_CENTER): Payer: BC Managed Care – PPO | Admitting: Internal Medicine

## 2020-09-12 ENCOUNTER — Encounter: Payer: Self-pay | Admitting: Internal Medicine

## 2020-09-12 ENCOUNTER — Other Ambulatory Visit: Payer: Self-pay

## 2020-09-12 VITALS — BP 128/72 | HR 64 | Temp 98.0°F | Resp 13 | Ht 75.0 in | Wt 290.4 lb

## 2020-09-12 DIAGNOSIS — K5730 Diverticulosis of large intestine without perforation or abscess without bleeding: Secondary | ICD-10-CM

## 2020-09-12 DIAGNOSIS — D123 Benign neoplasm of transverse colon: Secondary | ICD-10-CM

## 2020-09-12 DIAGNOSIS — R195 Other fecal abnormalities: Secondary | ICD-10-CM

## 2020-09-12 MED ORDER — SODIUM CHLORIDE 0.9 % IV SOLN: 500.0000 mL | Freq: Once | INTRAVENOUS | Status: DC

## 2020-09-12 NOTE — Progress Notes (Signed)
PT taken to PACU. Monitors in place. VSS. Report given to RN. 

## 2020-09-12 NOTE — Progress Notes (Signed)
Called to room to assist during endoscopic procedure.  Patient ID and intended procedure confirmed with present staff. Received instructions for my participation in the procedure from the performing physician.  

## 2020-09-12 NOTE — Progress Notes (Signed)
GI PREPROCEDURE H&P  The patient presents today for colonoscopy due to positive Cologuard testing.  He was evaluated fully in the office April 17, 2020.  There have been no interval changes in his clinical status.  Physical exam is unchanged.  See previous full evaluation below.  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  I saw the patient in April 2016 regarding elevated hepatic transaminases.  After extensive work-up, the etiology was felt to be fatty liver.  See that dictation for details.  As part of a colon cancer screening strategy, patient recently elected Cologuard testing.  This returned positive.  Patient's lower GI review of systems is negative.  No change in bowel habits or bleeding.  Occasional hard stools.  He does have intermittent problems with reflux.  Describes classic symptoms.  No dysphagia..  Review of blood work from January 31, 2020 shows AST of 42 and ALT 77.  Other liver tests normal.  CBC unremarkable with hemoglobin 13.4.  Normal platelets.  Elevated lipids.  Hemoglobin A1c 5.9.   REVIEW OF SYSTEMS:   All non-GI ROS negative unless otherwise stated in the HPI except for muscle cramps, fatigue       Past Medical History:  Diagnosis Date   Arthritis     Hyperlipidemia     Hypertension     Seasonal allergies     Transaminitis             Past Surgical History:  Procedure Laterality Date   BICEPT TENODESIS Left 10/06/2018    Procedure: BICEPS TENODESIS;  Surgeon: Hiram Gash, MD;  Location: [facility];  Service: Orthopedics;  Laterality: Left;   HERNIA REPAIR       KNEE ARTHROSCOPY Right      x 2   ROTATOR CUFF REPAIR Bilateral     SHOULDER ARTHROSCOPY WITH ROTATOR CUFF REPAIR AND SUBACROMIAL DECOMPRESSION Left 10/06/2018    Procedure: LEFT SHOULDER ARTHROSCOPY WITH ROTATOR CUFF REPAIR  AND SUBACROMIAL DECOMPRESSION, EXTENSIVE DEBRIDEMENT, BICEP TENODESIS;  Surgeon: Hiram Gash, MD;  Location: [facility];  Service: Orthopedics;  Laterality: Left;  BLOCK      Social History Jerry Escobar  reports that he has been smoking. He has never used smokeless tobacco. He reports current alcohol use of about 3.0 standard drinks of alcohol per week. He reports that he does not use drugs.   family history includes Breast cancer in his paternal grandmother; Diverticulitis in his father; Prostate cancer in his father.       Allergies  Allergen Reactions   Ace Inhibitors Cough          PHYSICAL EXAMINATION: Vital signs: BP 132/86   Pulse 80   Ht 6' 3"  (1.905 m)   Wt 298 lb 9.6 oz (135.4 kg)   BMI 37.32 kg/m   Constitutional: generally well-appearing, no acute distress Psychiatric: alert and oriented x3, cooperative Eyes: extraocular movements intact, anicteric, conjunctiva pink Mouth: oral pharynx moist, no lesions Neck: supple no lymphadenopathy Cardiovascular: heart regular rate and rhythm, no murmur Lungs: clear to auscultation bilaterally Abdomen: soft, nontender, nondistended, no obvious ascites, no peritoneal signs, normal bowel sounds, no organomegaly Rectal: Deferred until colonoscopy Extremities: no clubbing, cyanosis, or lower extremity edema bilaterally Skin: no lesions on visible extremities Neuro: No focal deficits. No asterixis.  ASSESSMENT:   1.  Positive Cologuard testing.  Rule out neoplasia 2.  Mild elevation of hepatic transaminases.  Fatty liver 3.  GERD without alarm features     PLAN:   1.  Exercise and weight loss 2.  Acid reducing agent on demand.  May use PPI or H2 receptor antagonist. 3.  Schedule colonoscopy.The nature of the procedure, as well as the risks, benefits, and alternatives were carefully and thoroughly reviewed with the patient. Ample time for discussion and questions allowed. The patient understood, was  satisfied, and agreed to proceed.  Docia Chuck. Geri [place]., M.D. [facility] Division of Gastroenterology

## 2020-09-12 NOTE — Progress Notes (Signed)
VS- Jerry Escobar  Pt's states no medical or surgical changes since previsit or office visit.

## 2020-09-12 NOTE — Op Note (Signed)
[place] Patient Name: Jerry Escobar Procedure Date: 09/12/2020 2:31 PM MRN: [id number] Endoscopist: Docia Chuck. Henrene Pastor , MD Age: 50 Referring MD:  Date of Birth: Jan 20, 1970 Gender: Male Account #: [id number] Procedure:                Colonoscopy with hot snare polypectomy x 1 Indications:              Positive Cologuard test Medicines:                Monitored Anesthesia Care Procedure:                Pre-Anesthesia Assessment:                           - Prior to the procedure, a History and Physical                            was performed, and patient medications and                            allergies were reviewed. The patient's tolerance of                            previous anesthesia was also reviewed. The risks                            and benefits of the procedure and the sedation                            options and risks were discussed with the patient.                            All questions were answered, and informed consent                            was obtained. Prior Anticoagulants: The patient has                            taken no previous anticoagulant or antiplatelet                            agents. ASA Grade Assessment: II - A patient with                            mild systemic disease. After reviewing the risks                            and benefits, the patient was deemed in                            satisfactory condition to undergo the procedure.                           After obtaining informed consent, the colonoscope  was passed under direct vision. Throughout the                            procedure, the patient's blood pressure, pulse, and                            oxygen saturations were monitored continuously. The                            Colonoscope was introduced through the anus and                            advanced to the the cecum, identified by                            appendiceal  orifice and ileocecal valve. The                            ileocecal valve, appendiceal orifice, and rectum                            were photographed. The quality of the bowel                            preparation was excellent. The colonoscopy was                            performed without difficulty. The patient tolerated                            the procedure well. The bowel preparation used was                            SUPREP via split dose instruction. Scope In: 2:45:35 PM Scope Out: 3:03:47 PM Scope Withdrawal Time: 0 hours 15 minutes 28 seconds  Total Procedure Duration: 0 hours 18 minutes 12 seconds  Findings:                 A 9 mm polyp was found in the distal transverse                            colon. The polyp was pedunculated. The polyp was                            removed with a hot snare. Resection and retrieval                            were complete.                           Multiple diverticula were found in the left colon                            and right colon.  The exam was otherwise without abnormality on                            direct and retroflexion views. Complications:            No immediate complications. Estimated blood loss:                            None. Estimated Blood Loss:     Estimated blood loss: none. Impression:               - One 9 mm polyp in the distal transverse colon,                            removed with a hot snare. Resected and retrieved.                           - Diverticulosis in the left colon and in the right                            colon.                           - The examination was otherwise normal on direct                            and retroflexion views. Recommendation:           - Repeat colonoscopy in 5 years for surveillance.                           - Patient has a contact number available for                            emergencies. The signs and symptoms of  potential                            delayed complications were discussed with the                            patient. Return to normal activities tomorrow.                            Written discharge instructions were provided to the                            patient.                           - Resume previous diet.                           - Continue present medications.                           - Await pathology results. Docia Chuck. Henrene Pastor, MD 09/12/2020 3:11:37 PM This report has been  signed electronically.

## 2020-09-12 NOTE — Patient Instructions (Signed)
Handouts given for polyps and diverticulosis.  YOU HAD AN ENDOSCOPIC PROCEDURE TODAY AT [facility]:   Refer to the procedure report that was given to you for any specific questions about what was found during the examination.  If the procedure report does not answer your questions, please call your gastroenterologist to clarify.  If you requested that your care partner not be given the details of your procedure findings, then the procedure report has been included in a sealed envelope for you to review at your convenience later.  YOU SHOULD EXPECT: Some feelings of bloating in the abdomen. Passage of more gas than usual.  Walking can help get rid of the air that was put into your GI tract during the procedure and reduce the bloating. If you had a lower endoscopy (such as a colonoscopy or flexible sigmoidoscopy) you may notice spotting of blood in your stool or on the toilet paper. If you underwent a bowel prep for your procedure, you may not have a normal bowel movement for a few days.  Please Note:  You might notice some irritation and congestion in your nose or some drainage.  This is from the oxygen used during your procedure.  There is no need for concern and it should clear up in a day or so.  SYMPTOMS TO REPORT IMMEDIATELY:  Following lower endoscopy (colonoscopy or flexible sigmoidoscopy):  Excessive amounts of blood in the stool  Significant tenderness or worsening of abdominal pains  Swelling of the abdomen that is new, acute  Fever of 100F or higher  For urgent or emergent issues, a gastroenterologist can be reached at any hour by calling [phone]. Do not use MyChart messaging for urgent concerns.    DIET:  We do recommend a small meal at first, but then you may proceed to your regular diet.  Drink plenty of fluids but you should avoid alcoholic beverages for 24 hours.  ACTIVITY:  You should plan to take it easy for the rest of today and you should NOT DRIVE  or use heavy machinery until tomorrow (because of the sedation medicines used during the test).    FOLLOW UP: Our staff will call the number listed on your records 48-72 hours following your procedure to check on you and address any questions or concerns that you may have regarding the information given to you following your procedure. If we do not reach you, we will leave a message.  We will attempt to reach you two times.  During this call, we will ask if you have developed any symptoms of COVID 19. If you develop any symptoms (ie: fever, flu-like symptoms, shortness of breath, cough etc.) before then, please call [phone].  If you test positive for Covid 19 in the 2 weeks post procedure, please call and report this information to [place].    If any biopsies were taken you will be contacted by phone or by letter within the next 1-3 weeks.  Please call us at [phone] if you have not heard about the biopsies in 3 weeks.    SIGNATURES/CONFIDENTIALITY: You and/or your care partner have signed paperwork which will be entered into your electronic medical record.  These signatures attest to the fact that that the information above on your After Visit Summary has been reviewed and is understood.  Full responsibility of the confidentiality of this discharge information lies with you and/or your care-partner.

## 2020-09-16 ENCOUNTER — Telehealth: Payer: Self-pay | Admitting: *Deleted

## 2020-09-16 NOTE — Telephone Encounter (Signed)
  Follow up Call-  Call back number 09/12/2020  Post procedure Call Back phone  # [phone]  Permission to leave phone message Yes  Some recent data might be hidden     Patient questions:  Do you have a fever, pain , or abdominal swelling? No. Pain Score  0 *  Have you tolerated food without any problems? Yes.    Have you been able to return to your normal activities? Yes.    Do you have any questions about your discharge instructions: Diet   No. Medications  No. Follow up visit  No.  Do you have questions or concerns about your Care? No.  Actions: * If pain score is 4 or above: No action needed, pain <4.Have you developed a fever since your procedure? no  2.   Have you had an respiratory symptoms (SOB or cough) since your procedure? no  3.   Have you tested positive for COVID 19 since your procedure no  4.   Have you had any family members/close contacts diagnosed with the COVID 19 since your procedure?  no   If yes to any of these questions please route to Joylene John, RN and Joella Prince, RN

## 2020-09-20 ENCOUNTER — Encounter: Payer: Self-pay | Admitting: Internal Medicine

## 2020-09-20 DIAGNOSIS — S83272D Complex tear of lateral meniscus, current injury, left knee, subsequent encounter: Secondary | ICD-10-CM | POA: Diagnosis not present

## 2020-09-20 DIAGNOSIS — M2242 Chondromalacia patellae, left knee: Secondary | ICD-10-CM | POA: Diagnosis not present

## 2020-09-24 DIAGNOSIS — M2242 Chondromalacia patellae, left knee: Secondary | ICD-10-CM | POA: Diagnosis not present

## 2020-09-24 DIAGNOSIS — S83272D Complex tear of lateral meniscus, current injury, left knee, subsequent encounter: Secondary | ICD-10-CM | POA: Diagnosis not present

## 2020-09-27 DIAGNOSIS — S83272D Complex tear of lateral meniscus, current injury, left knee, subsequent encounter: Secondary | ICD-10-CM | POA: Diagnosis not present

## 2020-09-27 DIAGNOSIS — M2242 Chondromalacia patellae, left knee: Secondary | ICD-10-CM | POA: Diagnosis not present

## 2020-10-01 DIAGNOSIS — M2242 Chondromalacia patellae, left knee: Secondary | ICD-10-CM | POA: Diagnosis not present

## 2020-10-01 DIAGNOSIS — S83272D Complex tear of lateral meniscus, current injury, left knee, subsequent encounter: Secondary | ICD-10-CM | POA: Diagnosis not present

## 2020-10-03 DIAGNOSIS — S83272D Complex tear of lateral meniscus, current injury, left knee, subsequent encounter: Secondary | ICD-10-CM | POA: Diagnosis not present

## 2020-10-03 DIAGNOSIS — M2242 Chondromalacia patellae, left knee: Secondary | ICD-10-CM | POA: Diagnosis not present

## 2020-10-08 DIAGNOSIS — M2242 Chondromalacia patellae, left knee: Secondary | ICD-10-CM | POA: Diagnosis not present

## 2020-10-08 DIAGNOSIS — S83272D Complex tear of lateral meniscus, current injury, left knee, subsequent encounter: Secondary | ICD-10-CM | POA: Diagnosis not present

## 2020-10-24 ENCOUNTER — Other Ambulatory Visit: Payer: Self-pay | Admitting: Sports Medicine

## 2020-10-24 DIAGNOSIS — I10 Essential (primary) hypertension: Secondary | ICD-10-CM

## 2020-11-16 ENCOUNTER — Other Ambulatory Visit: Payer: Self-pay | Admitting: Sports Medicine

## 2020-11-29 DIAGNOSIS — Z79899 Other long term (current) drug therapy: Secondary | ICD-10-CM | POA: Diagnosis not present

## 2020-11-29 DIAGNOSIS — I10 Essential (primary) hypertension: Secondary | ICD-10-CM | POA: Diagnosis not present

## 2020-11-29 DIAGNOSIS — R079 Chest pain, unspecified: Secondary | ICD-10-CM | POA: Diagnosis not present

## 2020-11-29 DIAGNOSIS — R9431 Abnormal electrocardiogram [ECG] [EKG]: Secondary | ICD-10-CM | POA: Diagnosis not present

## 2020-11-29 DIAGNOSIS — R0789 Other chest pain: Secondary | ICD-10-CM | POA: Diagnosis not present

## 2020-12-04 ENCOUNTER — Other Ambulatory Visit: Payer: Self-pay | Admitting: Sports Medicine

## 2020-12-04 DIAGNOSIS — N529 Male erectile dysfunction, unspecified: Secondary | ICD-10-CM

## 2020-12-27 DIAGNOSIS — F4320 Adjustment disorder, unspecified: Secondary | ICD-10-CM | POA: Diagnosis not present

## 2021-01-03 ENCOUNTER — Ambulatory Visit: Payer: BC Managed Care – PPO | Admitting: Sports Medicine

## 2021-01-03 ENCOUNTER — Ambulatory Visit (INDEPENDENT_AMBULATORY_CARE_PROVIDER_SITE_OTHER): Payer: BC Managed Care – PPO

## 2021-01-03 ENCOUNTER — Other Ambulatory Visit: Payer: Self-pay

## 2021-01-03 DIAGNOSIS — J40 Bronchitis, not specified as acute or chronic: Secondary | ICD-10-CM | POA: Diagnosis not present

## 2021-01-03 DIAGNOSIS — M23307 Other meniscus derangements, unspecified meniscus, left knee: Secondary | ICD-10-CM

## 2021-01-03 DIAGNOSIS — R059 Cough, unspecified: Secondary | ICD-10-CM | POA: Diagnosis not present

## 2021-01-03 DIAGNOSIS — R0789 Other chest pain: Secondary | ICD-10-CM

## 2021-01-03 MED ORDER — BENZONATATE 200 MG PO CAPS
200.0000 mg | ORAL_CAPSULE | Freq: Three times a day (TID) | ORAL | 0 refills | Status: DC | PRN
Start: 2021-01-03 — End: 2021-02-21

## 2021-01-03 MED ORDER — NAPROXEN 500 MG PO TABS: 500.0000 mg | ORAL_TABLET | Freq: Two times a day (BID) | ORAL | 3 refills | Status: DC

## 2021-01-03 MED ORDER — AZITHROMYCIN 250 MG PO TABS: ORAL_TABLET | ORAL | 0 refills | Status: DC

## 2021-01-03 NOTE — Progress Notes (Signed)
° ° °  Procedures performed today:    None.  Independent interpretation of notes and tests performed by another provider:   None.  Brief History, Exam, Impression, and Recommendations:    Bronchitis This is a pleasant 50 year old male, he has had a cough about 2 weeks now, no other symptoms, no runny nose, sore throat, fevers, chills. Exam is unremarkable, auscultation is clear. Symptoms are relatively mild, considering duration we will do a course of azithromycin, chest x-ray, Tessalon Perles. Return to see me as needed.  Degenerative tear of meniscus, left Kase had a degenerative tear of the left knee meniscus. He has a history of a knee scope x2 on the right. He failed multiple modalities including injections and NSAIDs, he has had arthroscopy with Dr. Berenice Primas, overall doing okay, has a bit of effusion as is expected with unhealthy cartilage. We are using an NSAID for a different process, if this helps great, if not we can do another aspiration and injection.  Right-sided chest wall pain Initially suspected to be secondary to a subcutaneous lipoma, this was injected approximately 4-1/2 years ago with only minimal relief. Symptoms are for the most part mild and resolve with activity in the morning, so we will try naproxen 500 nightly.    ___________________________________________ Gwen Her. Dianah Field, M.D., ABFM., CAQSM. Primary Care and [place] Instructor of [place] of [facility] of Medicine

## 2021-01-03 NOTE — Assessment & Plan Note (Signed)
Initially suspected to be secondary to a subcutaneous lipoma, this was injected approximately 4-1/2 years ago with only minimal relief. Symptoms are for the most part mild and resolve with activity in the morning, so we will try naproxen 500 nightly.

## 2021-01-03 NOTE — Assessment & Plan Note (Signed)
This is a pleasant 50 year old male, he has had a cough about 2 weeks now, no other symptoms, no runny nose, sore throat, fevers, chills. Exam is unremarkable, auscultation is clear. Symptoms are relatively mild, considering duration we will do a course of azithromycin, chest x-ray, Tessalon Perles. Return to see me as needed.

## 2021-01-03 NOTE — Assessment & Plan Note (Signed)
Jerry Escobar had a degenerative tear of the left knee meniscus. He has a history of a knee scope x2 on the right. He failed multiple modalities including injections and NSAIDs, he has had arthroscopy with Dr. Berenice Primas, overall doing okay, has a bit of effusion as is expected with unhealthy cartilage. We are using an NSAID for a different process, if this helps great, if not we can do another aspiration and injection.

## 2021-02-14 ENCOUNTER — Encounter: Payer: BC Managed Care – PPO | Admitting: Sports Medicine

## 2021-02-21 ENCOUNTER — Encounter: Payer: Self-pay | Admitting: Sports Medicine

## 2021-02-21 ENCOUNTER — Ambulatory Visit (INDEPENDENT_AMBULATORY_CARE_PROVIDER_SITE_OTHER): Payer: BC Managed Care – PPO | Admitting: Sports Medicine

## 2021-02-21 ENCOUNTER — Other Ambulatory Visit: Payer: Self-pay

## 2021-02-21 VITALS — BP 161/92 | HR 74 | Ht 75.0 in | Wt 292.0 lb

## 2021-02-21 DIAGNOSIS — Z23 Encounter for immunization: Secondary | ICD-10-CM | POA: Diagnosis not present

## 2021-02-21 DIAGNOSIS — N529 Male erectile dysfunction, unspecified: Secondary | ICD-10-CM | POA: Diagnosis not present

## 2021-02-21 DIAGNOSIS — N139 Obstructive and reflux uropathy, unspecified: Secondary | ICD-10-CM

## 2021-02-21 DIAGNOSIS — R7303 Prediabetes: Secondary | ICD-10-CM | POA: Diagnosis not present

## 2021-02-21 DIAGNOSIS — H6123 Impacted cerumen, bilateral: Secondary | ICD-10-CM | POA: Diagnosis not present

## 2021-02-21 DIAGNOSIS — Z0000 Encounter for general adult medical examination without abnormal findings: Secondary | ICD-10-CM | POA: Diagnosis not present

## 2021-02-21 DIAGNOSIS — I10 Essential (primary) hypertension: Secondary | ICD-10-CM

## 2021-02-21 MED ORDER — AMLODIPINE BESYLATE 10 MG PO TABS: 10.0000 mg | ORAL_TABLET | Freq: Every day | ORAL | 3 refills | Status: DC

## 2021-02-21 MED ORDER — VALSARTAN-HYDROCHLOROTHIAZIDE 320-25 MG PO TABS: 1.0000 | ORAL_TABLET | Freq: Every day | ORAL | 3 refills | Status: DC

## 2021-02-21 NOTE — Addendum Note (Signed)
Addended by: Gust Brooms on: 02/21/2021 10:41 AM   Modules accepted: Orders

## 2021-02-21 NOTE — Assessment & Plan Note (Signed)
Mild bilateral hearing loss due to cerumen impactions, right worse than left, bilateral irrigation.

## 2021-02-21 NOTE — Progress Notes (Signed)
Subjective:    CC: Annual Physical Exam  HPI:  This patient is here for their annual physical  I reviewed the past medical history, family history, social history, surgical history, and allergies today and no changes were needed.  Please see the problem list section below in epic for further details.  Past Medical History: Past Medical History:  Diagnosis Date   Arthritis    Hyperlipidemia    Hypertension    Seasonal allergies    Transaminitis    Past Surgical History: Past Surgical History:  Procedure Laterality Date   BICEPT TENODESIS Left 10/06/2018   Procedure: BICEPS TENODESIS;  Surgeon: Hiram Gash, MD;  Location: [facility];  Service: Orthopedics;  Laterality: Left;   HERNIA REPAIR     KNEE ARTHROSCOPY Bilateral    ROTATOR CUFF REPAIR Bilateral    SHOULDER ARTHROSCOPY WITH ROTATOR CUFF REPAIR AND SUBACROMIAL DECOMPRESSION Left 10/06/2018   Procedure: LEFT SHOULDER ARTHROSCOPY WITH ROTATOR CUFF REPAIR AND SUBACROMIAL DECOMPRESSION, EXTENSIVE DEBRIDEMENT, BICEP TENODESIS;  Surgeon: Hiram Gash, MD;  Location: [facility];  Service: Orthopedics;  Laterality: Left;  BLOCK   Social History: Social History   Socioeconomic History   Marital status: Single    Spouse name: Not on file   Number of children: 3   Years of education: Not on file   Highest education level: Not on file  Occupational History   Not on file  Tobacco Use   Smoking status: Every Day    Types: Cigars   Smokeless tobacco: Never  Vaping Use   Vaping Use: Never used  Substance and Sexual Activity   Alcohol use: Yes    Alcohol/week: 3.0 standard drinks    Types: 3 Standard drinks or equivalent per week    Comment: Occasional   Drug use: No   Sexual activity: Not on file  Other Topics Concern   Not on file  Social History Narrative   Not on file   Social Determinants of Health   Financial Resource Strain: Not on file  Food Insecurity: Not on file   Transportation Needs: Not on file  Physical Activity: Not on file  Stress: Not on file  Social Connections: Not on file   Family History: Family History  Problem Relation Age of Onset   Prostate cancer Father    Diverticulitis Father    Breast cancer Paternal Grandmother    Colon cancer Neg Hx    Esophageal cancer Neg Hx    Pancreatic cancer Neg Hx    Stomach cancer Neg Hx    Liver disease Neg Hx    Rectal cancer Neg Hx    Allergies: Allergies  Allergen Reactions   Ace Inhibitors Cough   Medications: See med rec.  Review of Systems: No headache, visual changes, nausea, vomiting, diarrhea, constipation, dizziness, abdominal pain, skin rash, fevers, chills, night sweats, swollen lymph nodes, weight loss, chest pain, body aches, joint swelling, muscle aches, shortness of breath, mood changes, visual or auditory hallucinations.  Objective:    General: Well Developed, well nourished, and in no acute distress.  Neuro: Alert and oriented x3, extra-ocular muscles intact, sensation grossly intact. Cranial nerves II through XII are intact, motor, sensory, and coordinative functions are all intact. HEENT: Normocephalic, atraumatic, pupils equal round reactive to light, neck supple, no masses, no lymphadenopathy, thyroid nonpalpable. Oropharynx, nasopharynx are unremarkable.  Bilateral cerumen impaction. Skin: Warm and dry, no rashes noted.  Cardiac: Regular rate and rhythm, no murmurs rubs or gallops.  Respiratory: Clear to auscultation bilaterally. Not using accessory muscles, speaking in full sentences.  Abdominal: Soft, nontender, nondistended, positive bowel sounds, no masses, no organomegaly.  Musculoskeletal: Shoulder, elbow, wrist, hip, knee, ankle stable, and with full range of motion.  Indication: Cerumen impaction of the left and right ear(s) Medical necessity statement: On physical examination, cerumen impairs clinically significant portions of the external auditory canal,  and tympanic membrane. Noted obstructive, copious cerumen that cannot be removed without magnification and instrumentations requiring physician skills Consent: Discussed benefits and risks of procedure and verbal consent obtained Procedure: Patient was prepped for the procedure. Utilized an otoscope to assess and take note of the ear canal, the tympanic membrane, and the presence, amount, and placement of the cerumen. Gentle water irrigation was utilized to remove cerumen.  Post procedure examination: shows cerumen was completely removed. Patient tolerated procedure well. The patient is made aware that they may experience temporary vertigo, temporary hearing loss, and temporary discomfort. If these symptom last for more than 24 hours to call the clinic or proceed to the ED.  Impression and Recommendations:    The patient was counselled, risk factors were discussed, anticipatory guidance given.  Annual physical exam Annual physical as above, Shingrix #1 today, nurse visit for #2 in 2 to 6 months.   Bilateral hearing loss due to cerumen impaction Mild bilateral hearing loss due to cerumen impactions, right worse than left, bilateral irrigation.  ___________________________________________ Gwen Her. Dianah Field, M.D., ABFM., CAQSM. Primary Care and Sports Medicine [place] [facility]  Adjunct Professor of [place] of [facility] of Medicine

## 2021-02-21 NOTE — Assessment & Plan Note (Signed)
Annual physical as above, Shingrix #1 today, nurse visit for #2 in 2 to 6 months.

## 2021-02-24 LAB — LIPID PANEL
Cholesterol: 202 mg/dL — ABNORMAL HIGH (ref <200)
HDL: 46 mg/dL (ref >=40)
LDL Cholesterol (Calc): 121 mg/dL (calc) — ABNORMAL HIGH
Non-HDL Cholesterol (Calc): 156 mg/dL (calc) — ABNORMAL HIGH (ref <130)
Total CHOL/HDL Ratio: 4.4 (calc) (ref <5.0)
Triglycerides: 232 mg/dL — ABNORMAL HIGH (ref <150)

## 2021-02-24 LAB — COMPREHENSIVE METABOLIC PANEL
AG Ratio: 1.7 (calc) (ref 1.0–2.5)
ALT: 62 U/L — ABNORMAL HIGH (ref 9–46)
AST: 37 U/L — ABNORMAL HIGH (ref 10–35)
Albumin: 4.8 g/dL (ref 3.6–5.1)
Alkaline phosphatase (APISO): 66 U/L (ref 35–144)
BUN: 17 mg/dL (ref 7–25)
CO2: 29 mmol/L (ref 20–32)
Calcium: 9.6 mg/dL (ref 8.6–10.3)
Chloride: 101 mmol/L (ref 98–110)
Creat: 1.1 mg/dL (ref 0.70–1.30)
Globulin: 2.8 g/dL (calc) (ref 1.9–3.7)
Glucose, Bld: 91 mg/dL (ref 65–99)
Potassium: 4.2 mmol/L (ref 3.5–5.3)
Sodium: 140 mmol/L (ref 135–146)
Total Bilirubin: 0.5 mg/dL (ref 0.2–1.2)
Total Protein: 7.6 g/dL (ref 6.1–8.1)

## 2021-02-24 LAB — PSA, TOTAL AND FREE
PSA, % Free: 18 % (calc) — ABNORMAL LOW (ref >25)
PSA, Free: 0.2 ng/mL
PSA, Total: 1.1 ng/mL (ref <=4.0)

## 2021-02-24 LAB — CBC
HCT: 42 % (ref 38.5–50.0)
Hemoglobin: 13.9 g/dL (ref 13.2–17.1)
MCH: 27.7 pg (ref 27.0–33.0)
MCHC: 33.1 g/dL (ref 32.0–36.0)
MCV: 83.8 fL (ref 80.0–100.0)
MPV: 11.5 fL (ref 7.5–12.5)
Platelets: 214 10*3/uL (ref 140–400)
RBC: 5.01 10*6/uL (ref 4.20–5.80)
RDW: 13.2 % (ref 11.0–15.0)
WBC: 5 10*3/uL (ref 3.8–10.8)

## 2021-02-24 LAB — TSH: TSH: 1.22 mIU/L (ref 0.40–4.50)

## 2021-02-24 LAB — HEMOGLOBIN A1C
Hgb A1c MFr Bld: 5.5 % of total Hgb (ref <5.7)
Mean Plasma Glucose: 111 mg/dL
eAG (mmol/L): 6.2 mmol/L

## 2021-05-14 ENCOUNTER — Other Ambulatory Visit: Payer: Self-pay | Admitting: Sports Medicine

## 2021-05-14 DIAGNOSIS — R0789 Other chest pain: Secondary | ICD-10-CM

## 2021-05-16 ENCOUNTER — Other Ambulatory Visit: Payer: Self-pay | Admitting: Sports Medicine

## 2021-05-16 DIAGNOSIS — N529 Male erectile dysfunction, unspecified: Secondary | ICD-10-CM

## 2021-05-23 ENCOUNTER — Ambulatory Visit (INDEPENDENT_AMBULATORY_CARE_PROVIDER_SITE_OTHER): Payer: BC Managed Care – PPO | Admitting: Sports Medicine

## 2021-05-23 VITALS — Temp 97.5°F

## 2021-05-23 DIAGNOSIS — Z23 Encounter for immunization: Secondary | ICD-10-CM

## 2021-05-23 NOTE — Progress Notes (Signed)
? ?  Established Patient Office Visit ? ?Subjective   ?Patient ID: Lexus Barletta, male    DOB: 11/03/1970  Age: 51 y.o. MRN: [id number] ? ?Chief Complaint  ?Patient presents with  ? Immunizations  ? ? ?HPI ? ?Grayling Ju is here for last shingles vaccine.  ? ?ROS ? ?  ?Objective:  ?  ? ?Temp (!) 97.5 ?F (36.4 ?C) (Temporal)  ? ? ?Physical Exam ? ? ?No results found for any visits on 05/23/21. ? ? ? ?The 10-year ASCVD risk score (Arnett DK, et al., 2019) is: 22.4% ? ?  ?Assessment & Plan:  ?Shingles vaccine - Patient tolerated injection well without complications.  ? ?Problem List Items Addressed This Visit   ?None ?Visit Diagnoses   ? ? Need for shingles vaccine    -  Primary  ? Relevant Orders  ? Varicella-zoster vaccine IM (Shingrix) (Completed)  ? ?  ? ? ?No follow-ups on file.  ? ? ?Lavell Luster, [place] ? ?

## 2021-08-05 ENCOUNTER — Encounter: Payer: Self-pay | Admitting: Sports Medicine

## 2021-08-05 ENCOUNTER — Ambulatory Visit (INDEPENDENT_AMBULATORY_CARE_PROVIDER_SITE_OTHER): Payer: BC Managed Care – PPO

## 2021-08-05 ENCOUNTER — Ambulatory Visit: Payer: BC Managed Care – PPO | Admitting: Sports Medicine

## 2021-08-05 DIAGNOSIS — R0789 Other chest pain: Secondary | ICD-10-CM

## 2021-08-05 DIAGNOSIS — M546 Pain in thoracic spine: Secondary | ICD-10-CM | POA: Diagnosis not present

## 2021-08-05 DIAGNOSIS — M542 Cervicalgia: Secondary | ICD-10-CM | POA: Diagnosis not present

## 2021-08-05 DIAGNOSIS — M79642 Pain in left hand: Secondary | ICD-10-CM | POA: Insufficient documentation

## 2021-08-05 DIAGNOSIS — M545 Low back pain, unspecified: Secondary | ICD-10-CM

## 2021-08-05 DIAGNOSIS — M5136 Other intervertebral disc degeneration, lumbar region: Secondary | ICD-10-CM | POA: Diagnosis not present

## 2021-08-05 MED ORDER — PREDNISONE 50 MG PO TABS: ORAL_TABLET | ORAL | 0 refills | Status: DC

## 2021-08-05 NOTE — Assessment & Plan Note (Signed)
Jerry Escobar returns, he is a pleasant 51 year old male with right-sided flank, rib, and right upper quadrant discomfort. About 5 years ago we suspected a subcutaneous lipoma and injected it, this did not provide much relief. He has been taking naproxen which is minimally efficacious. Pain is constant, no precipitating or palliating factors. I am not able to reproduce it on exam. Differential is broad and includes thoracic radicular discomfort as well as pain from hepatic steatosis considering his habitus. We will add a CBC, CMP, urinalysis, right upper quadrant/abdominal ultrasound, thoracolumbar x-rays, 5 days of steroids. Core conditioning. Return to see me in 6 weeks.

## 2021-08-05 NOTE — Assessment & Plan Note (Addendum)
Pain left second MCP, he will cut back on sodium, prednisone as below will help. Adding x-rays. Return to see me in 6 weeks.

## 2021-08-05 NOTE — Progress Notes (Signed)
    Procedures performed today:    None.  Independent interpretation of notes and tests performed by another provider:   None.  Brief History, Exam, Impression, and Recommendations:    Right-sided chest wall pain Jerry Escobar returns, he is a pleasant 51 year old male with right-sided flank, rib, and right upper quadrant discomfort. About 5 years ago we suspected a subcutaneous lipoma and injected it, this did not provide much relief. He has been taking naproxen which is minimally efficacious. Pain is constant, no precipitating or palliating factors. I am not able to reproduce it on exam. Differential is broad and includes thoracic radicular discomfort as well as pain from hepatic steatosis considering his habitus. We will add a CBC, CMP, urinalysis, right upper quadrant/abdominal ultrasound, thoracolumbar x-rays, 5 days of steroids. Core conditioning. Return to see me in 6 weeks.  Left hand pain Pain left second MCP, he will cut back on sodium, prednisone as below will help. Adding x-rays. Return to see me in 6 weeks.    ____________________________________________ Jerry Escobar, M.D., ABFM., CAQSM., AME. Primary Care and Sports Medicine [place] [facility]  Adjunct Professor of [place] of [facility] of Medicine  Risk manager

## 2021-08-06 LAB — URINALYSIS W MICROSCOPIC + REFLEX CULTURE
Bacteria, UA: NONE SEEN /HPF
Bilirubin Urine: NEGATIVE
Glucose, UA: NEGATIVE
Hgb urine dipstick: NEGATIVE
Hyaline Cast: NONE SEEN /LPF
Ketones, ur: NEGATIVE
Leukocyte Esterase: NEGATIVE
Nitrites, Initial: NEGATIVE
Protein, ur: NEGATIVE
RBC / HPF: NONE SEEN /HPF (ref 0–2)
Specific Gravity, Urine: 1.012 (ref 1.001–1.035)
Squamous Epithelial / HPF: NONE SEEN /HPF (ref <=5)
WBC, UA: NONE SEEN /HPF (ref 0–5)
pH: 7 (ref 5.0–8.0)

## 2021-08-06 LAB — COMPLETE METABOLIC PANEL WITH GFR
AG Ratio: 1.9 (calc) (ref 1.0–2.5)
ALT: 68 U/L — ABNORMAL HIGH (ref 9–46)
AST: 47 U/L — ABNORMAL HIGH (ref 10–35)
Albumin: 4.8 g/dL (ref 3.6–5.1)
Alkaline phosphatase (APISO): 69 U/L (ref 35–144)
BUN: 15 mg/dL (ref 7–25)
CO2: 25 mmol/L (ref 20–32)
Calcium: 9 mg/dL (ref 8.6–10.3)
Chloride: 106 mmol/L (ref 98–110)
Creat: 0.98 mg/dL (ref 0.70–1.30)
Globulin: 2.5 g/dL (calc) (ref 1.9–3.7)
Glucose, Bld: 87 mg/dL (ref 65–99)
Potassium: 3.9 mmol/L (ref 3.5–5.3)
Sodium: 139 mmol/L (ref 135–146)
Total Bilirubin: 0.5 mg/dL (ref 0.2–1.2)
Total Protein: 7.3 g/dL (ref 6.1–8.1)
eGFR: 94 mL/min/{1.73_m2} (ref >=60)

## 2021-08-06 LAB — CBC
HCT: 43.6 % (ref 38.5–50.0)
Hemoglobin: 14.3 g/dL (ref 13.2–17.1)
MCH: 27.1 pg (ref 27.0–33.0)
MCHC: 32.8 g/dL (ref 32.0–36.0)
MCV: 82.7 fL (ref 80.0–100.0)
MPV: 11.8 fL (ref 7.5–12.5)
Platelets: 195 10*3/uL (ref 140–400)
RBC: 5.27 10*6/uL (ref 4.20–5.80)
RDW: 14.2 % (ref 11.0–15.0)
WBC: 4 10*3/uL (ref 3.8–10.8)

## 2021-08-06 LAB — NO CULTURE INDICATED

## 2021-08-08 ENCOUNTER — Ambulatory Visit (INDEPENDENT_AMBULATORY_CARE_PROVIDER_SITE_OTHER): Payer: BC Managed Care – PPO

## 2021-08-08 DIAGNOSIS — R1011 Right upper quadrant pain: Secondary | ICD-10-CM | POA: Diagnosis not present

## 2021-08-08 DIAGNOSIS — N281 Cyst of kidney, acquired: Secondary | ICD-10-CM | POA: Diagnosis not present

## 2021-08-08 DIAGNOSIS — R0789 Other chest pain: Secondary | ICD-10-CM

## 2021-08-08 DIAGNOSIS — K760 Fatty (change of) liver, not elsewhere classified: Secondary | ICD-10-CM | POA: Diagnosis not present

## 2021-09-16 ENCOUNTER — Ambulatory Visit: Payer: BC Managed Care – PPO | Admitting: Sports Medicine

## 2021-09-16 DIAGNOSIS — M79642 Pain in left hand: Secondary | ICD-10-CM | POA: Diagnosis not present

## 2021-09-16 DIAGNOSIS — R0789 Other chest pain: Secondary | ICD-10-CM | POA: Diagnosis not present

## 2021-09-16 DIAGNOSIS — N529 Male erectile dysfunction, unspecified: Secondary | ICD-10-CM | POA: Diagnosis not present

## 2021-09-16 MED ORDER — TADALAFIL 5 MG PO TABS: 5.0000 mg | ORAL_TABLET | Freq: Every day | ORAL | 3 refills | Status: DC

## 2021-09-16 NOTE — Assessment & Plan Note (Signed)
Pain essentially resolved, he occasionally has some discomfort when rolling over in the bed in the morning. We did some labs, we also did a right upper quadrant ultrasound that simply showed hepatic steatosis. Differential was broad including thoracic radicular discomfort, pain from steatohepatitis. He has been doing some core conditioning, he will go ahead and restart meloxicam at dinnertime. If insufficient improvement in 6 weeks we will consider CT abdomen and pelvis +/- MRI.

## 2021-09-16 NOTE — Progress Notes (Signed)
    Procedures performed today:    None.  Independent interpretation of notes and tests performed by another provider:   None.  Brief History, Exam, Impression, and Recommendations:    Left hand pain Pain left second MCP, he cut back on sodium, we had some prednisone, x-rays unrevealing, pain resolved.  Right-sided chest wall pain Pain essentially resolved, he occasionally has some discomfort when rolling over in the bed in the morning. We did some labs, we also did a right upper quadrant ultrasound that simply showed hepatic steatosis. Differential was broad including thoracic radicular discomfort, pain from steatohepatitis. He has been doing some core conditioning, he will go ahead and restart meloxicam at dinnertime. If insufficient improvement in 6 weeks we will consider CT abdomen and pelvis +/- MRI.  Erectile dysfunction Cialis 5 daily tends to work well, refilling this, he understands he can take 10 mg a couple of days prior to relations as well if needed.    ____________________________________________ Gwen Her. Dianah Field, M.D., ABFM., CAQSM., AME. Primary Care and Sports Medicine  [facility]  Adjunct Professor of [place] of [facility] of Medicine  Risk manager

## 2021-09-16 NOTE — Assessment & Plan Note (Signed)
Pain left second MCP, he cut back on sodium, we had some prednisone, x-rays unrevealing, pain resolved.

## 2021-09-16 NOTE — Assessment & Plan Note (Signed)
Cialis 5 daily tends to work well, refilling this, he understands he can take 10 mg a couple of days prior to relations as well if needed.

## 2021-10-28 ENCOUNTER — Ambulatory Visit: Payer: BC Managed Care – PPO | Admitting: Sports Medicine

## 2021-11-04 ENCOUNTER — Ambulatory Visit: Payer: BC Managed Care – PPO | Admitting: Sports Medicine

## 2021-11-07 ENCOUNTER — Ambulatory Visit: Payer: BC Managed Care – PPO | Admitting: Sports Medicine

## 2021-11-13 ENCOUNTER — Ambulatory Visit: Payer: BC Managed Care – PPO | Admitting: Sports Medicine

## 2021-11-13 ENCOUNTER — Encounter: Payer: Self-pay | Admitting: Sports Medicine

## 2021-11-13 DIAGNOSIS — R0789 Other chest pain: Secondary | ICD-10-CM

## 2021-11-13 NOTE — Progress Notes (Addendum)
    Procedures performed today:    None.  Independent interpretation of notes and tests performed by another provider:   None.  Brief History, Exam, Impression, and Recommendations:    Right-sided chest wall subcutaneous mass This pleasant 51 year old male returns, he continues to have right-sided flank pain, he does have a palpable mass distal to the right axilla, this mass is tender. Ultrasound was negative, we do need an MRI with and without contrast for further evaluation and surgical planning.. Symptoms have been present for years in spite of multiple measures including NSAIDs and intralesional steroid injection. Adding tramadol for pain relief in the meantime.  Update: MRI with and without contrast negative, I would like plastics to weigh in.    ____________________________________________ Gwen Her. Dianah Field, M.D., ABFM., CAQSM., AME. Primary Care and Sports Medicine [place] [facility]  Adjunct Professor of [place] of [facility] of Medicine  Risk manager

## 2021-11-13 NOTE — Assessment & Plan Note (Addendum)
This pleasant 51 year old male returns, he continues to have right-sided flank pain, he does have a palpable mass distal to the right axilla, this mass is tender. Ultrasound was negative, we do need an MRI with and without contrast for further evaluation and surgical planning.. Symptoms have been present for years in spite of multiple measures including NSAIDs and intralesional steroid injection. Adding tramadol for pain relief in the meantime.  Update: MRI with and without contrast negative, I would like plastics to weigh in.

## 2021-12-08 ENCOUNTER — Telehealth: Payer: Self-pay

## 2021-12-08 NOTE — Telephone Encounter (Signed)
Insurance company is requesting imaging within 60 days, he has had ultrasounds and chest x-rays, injections. A peer to peer was scheduled for tomorrow at 8:15 in the morning, I will try to knock this out on my drive into work.

## 2021-12-08 NOTE — Telephone Encounter (Signed)
Thank you. FYI - previous notes and past imaging results were faxed as part of the reconsideration submission.

## 2021-12-08 NOTE — Telephone Encounter (Signed)
Per insurance - auth denial remains after reconsideration submission. Provider will need to complete a peer to peer review at [phone]. Case # [id number].

## 2021-12-09 NOTE — Telephone Encounter (Signed)
MRI chest with and without contrast approved, approval number: [id number] expires Jun 06, 2021. Please get him scheduled.

## 2021-12-10 NOTE — Telephone Encounter (Signed)
Noted. Imaging center notified to contact patient for scheduling. Thanks.

## 2021-12-16 ENCOUNTER — Ambulatory Visit (INDEPENDENT_AMBULATORY_CARE_PROVIDER_SITE_OTHER): Payer: BC Managed Care – PPO

## 2021-12-16 DIAGNOSIS — R222 Localized swelling, mass and lump, trunk: Secondary | ICD-10-CM

## 2021-12-16 DIAGNOSIS — R0789 Other chest pain: Secondary | ICD-10-CM

## 2021-12-16 MED ORDER — GADOBUTROL 1 MMOL/ML IV SOLN
10.0000 mL | Freq: Once | INTRAVENOUS | Status: AC | PRN
  Administered 2021-12-16: 10 mL via INTRAVENOUS

## 2021-12-17 NOTE — Addendum Note (Signed)
Addended by: Silverio Decamp on: 12/17/2021 05:27 PM   Modules accepted: Orders

## 2021-12-19 ENCOUNTER — Encounter: Payer: Self-pay | Admitting: Plastic Surgery

## 2021-12-19 ENCOUNTER — Ambulatory Visit: Payer: BC Managed Care – PPO | Admitting: Plastic Surgery

## 2021-12-19 VITALS — BP 172/101 | HR 81 | Ht 75.0 in | Wt 293.2 lb

## 2021-12-19 DIAGNOSIS — M79621 Pain in right upper arm: Secondary | ICD-10-CM

## 2021-12-19 DIAGNOSIS — R0789 Other chest pain: Secondary | ICD-10-CM | POA: Diagnosis not present

## 2021-12-19 DIAGNOSIS — F411 Generalized anxiety disorder: Secondary | ICD-10-CM | POA: Diagnosis not present

## 2021-12-19 NOTE — Progress Notes (Signed)
Referring Provider Silverio Decamp, [place] [address] [place],  [place] [address]   CC:  Chief Complaint  Patient presents with   Advice Only      Jerry Escobar is an 51 y.o. male.  HPI: Jerry Escobar is a very pleasant 51 year old male who is referred for evaluation and management of an area of pain on his right ribs.  He states that approximately 8 years ago he had a trauma to his right ribs with significant posttraumatic pain.  The area was injected and the pain resolved however is now returned and he is looking for a solution to the pain.  He had an MRI done recently which did not show any abnormality in the area of concern.  I do not see any changes in the overlying skin.  Allergies  Allergen Reactions   Ace Inhibitors Cough    Outpatient Encounter Medications as of 12/19/2021  Medication Sig   acetaminophen (TYLENOL) 650 MG CR tablet Take 1 tablet (650 mg total) by mouth every 8 (eight) hours as needed for pain.   amLODipine (NORVASC) 10 MG tablet Take 1 tablet (10 mg total) by mouth daily.   Ascorbic Acid (VITAMIN C) 1000 MG tablet Take 1,000 mg by mouth daily.   ibuprofen (ADVIL) 800 MG tablet Take 1 tablet (800 mg total) by mouth every 8 (eight) hours as needed.   meloxicam (MOBIC) 15 MG tablet Take 1 tablet (15 mg total) by mouth daily with supper.   Multiple Vitamins-Minerals (MULTIVITAMIN ADULT PO) Take 1 capsule by mouth daily.   Omega-3 Fatty Acids (FISH OIL PO) Take 1 capsule by mouth daily.   tadalafil (CIALIS) 5 MG tablet Take 1 tablet (5 mg total) by mouth daily.   traMADol (ULTRAM) 50 MG tablet Take 1-2 tablets (50-100 mg total) by mouth every 8 (eight) hours as needed for moderate pain. Maximum 6 tabs per day.   valsartan-hydrochlorothiazide (DIOVAN-HCT) 320-25 MG tablet Take 1 tablet by mouth daily.   No facility-administered encounter medications on file as of 12/19/2021.     Past Medical History:  Diagnosis Date   Arthritis     Hyperlipidemia    Hypertension    Seasonal allergies    Transaminitis     Past Surgical History:  Procedure Laterality Date   BICEPT TENODESIS Left 10/06/2018   Procedure: BICEPS TENODESIS;  Surgeon: Hiram Gash, MD;  Location: [place];  Service: Orthopedics;  Laterality: Left;   HERNIA REPAIR     KNEE ARTHROSCOPY Bilateral    ROTATOR CUFF REPAIR Bilateral    SHOULDER ARTHROSCOPY WITH ROTATOR CUFF REPAIR AND SUBACROMIAL DECOMPRESSION Left 10/06/2018   Procedure: LEFT SHOULDER ARTHROSCOPY WITH ROTATOR CUFF REPAIR AND SUBACROMIAL DECOMPRESSION, EXTENSIVE DEBRIDEMENT, BICEP TENODESIS;  Surgeon: Hiram Gash, MD;  Location: [place];  Service: Orthopedics;  Laterality: Left;  BLOCK    Family History  Problem Relation Age of Onset   Prostate cancer Father    Diverticulitis Father    Breast cancer Paternal Grandmother    Colon cancer Neg Hx    Esophageal cancer Neg Hx    Pancreatic cancer Neg Hx    Stomach cancer Neg Hx    Liver disease Neg Hx    Rectal cancer Neg Hx     Social History   Social History Narrative   Not on file     Review of Systems General: Denies fevers, chills, weight loss CV: Denies chest pain, shortness of breath, palpitations  Chest: Localized pain at approximately the fifth intercostal space in the mid axillary line.  Physical Exam    12/19/2021    9:57 AM 11/13/2021    2:47 PM 08/05/2021   11:06 AM  Vitals with BMI  Height '6\' 3"'$  '6\' 3"'$  '6\' 3"'$   Weight 293 lbs 3 oz 295 lbs 294 lbs  BMI 36.65 57.32 20.25  Systolic 427  062  Diastolic 376  87  Pulse 81  85    General:  No acute distress,  Alert and oriented, Non-Toxic, Normal speech and affect Chest: No obvious abnormality on the external portion of the chest.  Very questionable palpable area in the subcutaneous tissues where the patient indicates his pain.   Assessment/Plan Posttraumatic pain, right chest wall: Given that his MRI shows no abnormalities and  palpation shows at best a very poorly defined area of thickness I am hesitant to offer any type of surgical treatment.  The area with to milliliters of a mixture of 1% lidocaine with epinephrine and quarter percent bupivacaine.  He did have subjective improvement in the area.  It is not unreasonable to send him to pain management to see if they have any solutions for his ongoing, chronic pain.  The referral.  Camillia Herter 12/19/2021, 10:28 AM

## 2021-12-25 DIAGNOSIS — F411 Generalized anxiety disorder: Secondary | ICD-10-CM | POA: Diagnosis not present

## 2021-12-31 DIAGNOSIS — F411 Generalized anxiety disorder: Secondary | ICD-10-CM | POA: Diagnosis not present

## 2022-01-08 ENCOUNTER — Telehealth (INDEPENDENT_AMBULATORY_CARE_PROVIDER_SITE_OTHER): Payer: BC Managed Care – PPO | Admitting: Family Medicine

## 2022-01-08 ENCOUNTER — Encounter: Payer: Self-pay | Admitting: Family Medicine

## 2022-01-08 VITALS — BP 142/94 | HR 91 | Ht 75.0 in | Wt 285.0 lb

## 2022-01-08 DIAGNOSIS — R058 Other specified cough: Secondary | ICD-10-CM | POA: Diagnosis not present

## 2022-01-08 DIAGNOSIS — F411 Generalized anxiety disorder: Secondary | ICD-10-CM | POA: Diagnosis not present

## 2022-01-08 MED ORDER — BENZONATATE 200 MG PO CAPS: 200.0000 mg | ORAL_CAPSULE | Freq: Two times a day (BID) | ORAL | 0 refills | Status: DC | PRN

## 2022-01-08 MED ORDER — PREDNISONE 50 MG PO TABS: ORAL_TABLET | ORAL | 0 refills | Status: DC

## 2022-01-08 NOTE — Progress Notes (Signed)
Sx's started:  1.5 weeks Current Sx's: cough, intermittent wheezing Medication: Mucinex, Alka Seltzer plus cold, Dayquil

## 2022-01-08 NOTE — Progress Notes (Signed)
Jerry Escobar - 51 y.o. male MRN [id number]  Date of birth: 1970-07-13   This visit type was conducted due to national recommendations for restrictions regarding the COVID-19 Pandemic (e.g. social distancing).  This format is felt to be most appropriate for this patient at this time.  All issues noted in this document were discussed and addressed.  No physical exam was performed (except for noted visual exam findings with Video Visits).  I discussed the limitations of evaluation and management by telemedicine and the availability of in person appointments. The patient expressed understanding and agreed to proceed.  I connected withNAME@ on 01/08/22 at  1:10 PM EST by a video enabled telemedicine application and verified that I am speaking with the correct person using two identifiers.  Present at visit: Luetta Nutting, DO [facility]   Patient Location: [place] [address]   Provider location:   [facility]  Chief Complaint  Patient presents with   Cough    HPI  Jerry Escobar is a 51 y.o. male who presents via audio/video conferencing for a telehealth visit today.  He reports cough for about 1.5-2 weeks.  Cough is non-productive.  Denies associated dyspnea but does not wheezing at times.  Non-smoker other than occasional cigar.  He has not prior history of asthma.  No fever, chills or chest pain.  Denies reflux symptoms.  Feels well otherwise.   ROS:  A comprehensive ROS was completed and negative except as noted per HPI  Past Medical History:  Diagnosis Date   Arthritis    Hyperlipidemia    Hypertension    Seasonal allergies    Transaminitis     Past Surgical History:  Procedure Laterality Date   BICEPT TENODESIS Left 10/06/2018   Procedure: BICEPS TENODESIS;  Surgeon: Hiram Gash, MD;  Location: [place];  Service: Orthopedics;  Laterality: Left;   HERNIA REPAIR     KNEE ARTHROSCOPY Bilateral    ROTATOR CUFF REPAIR Bilateral     SHOULDER ARTHROSCOPY WITH ROTATOR CUFF REPAIR AND SUBACROMIAL DECOMPRESSION Left 10/06/2018   Procedure: LEFT SHOULDER ARTHROSCOPY WITH ROTATOR CUFF REPAIR AND SUBACROMIAL DECOMPRESSION, EXTENSIVE DEBRIDEMENT, BICEP TENODESIS;  Surgeon: Hiram Gash, MD;  Location: [place];  Service: Orthopedics;  Laterality: Left;  BLOCK    Family History  Problem Relation Age of Onset   Prostate cancer Father    Diverticulitis Father    Breast cancer Paternal Grandmother    Colon cancer Neg Hx    Esophageal cancer Neg Hx    Pancreatic cancer Neg Hx    Stomach cancer Neg Hx    Liver disease Neg Hx    Rectal cancer Neg Hx     Social History   Socioeconomic History   Marital status: Single    Spouse name: Not on file   Number of children: 3   Years of education: Not on file   Highest education level: Not on file  Occupational History   Not on file  Tobacco Use   Smoking status: Unknown   Smokeless tobacco: Never   Tobacco comments:    Smokes cigars socially.   Vaping Use   Vaping Use: Never used  Substance and Sexual Activity   Alcohol use: Yes    Alcohol/week: 3.0 standard drinks of alcohol    Types: 3 Standard drinks or equivalent per week    Comment: Occasional   Drug use: No   Sexual activity: Not on file  Other Topics  Concern   Not on file  Social History Narrative   Not on file   Social Determinants of Health   Financial Resource Strain: Not on file  Food Insecurity: Not on file  Transportation Needs: Not on file  Physical Activity: Not on file  Stress: Not on file  Social Connections: Not on file  Intimate Partner Violence: Not on file     Current Outpatient Medications:    benzonatate (TESSALON) 200 MG capsule, Take 1 capsule (200 mg total) by mouth 2 (two) times daily as needed for cough., Disp: 20 capsule, Rfl: 0   predniSONE (DELTASONE) 50 MG tablet, Take '50mg'$  daily x5 days., Disp: 5 tablet, Rfl: 0   acetaminophen (TYLENOL) 650 MG CR tablet,  Take 1 tablet (650 mg total) by mouth every 8 (eight) hours as needed for pain., Disp: 90 tablet, Rfl: 3   amLODipine (NORVASC) 10 MG tablet, Take 1 tablet (10 mg total) by mouth daily., Disp: 90 tablet, Rfl: 3   Ascorbic Acid (VITAMIN C) 1000 MG tablet, Take 1,000 mg by mouth daily., Disp: , Rfl:    ibuprofen (ADVIL) 800 MG tablet, Take 1 tablet (800 mg total) by mouth every 8 (eight) hours as needed., Disp: 90 tablet, Rfl: 2   meloxicam (MOBIC) 15 MG tablet, Take 1 tablet (15 mg total) by mouth daily with supper., Disp: , Rfl:    Multiple Vitamins-Minerals (MULTIVITAMIN ADULT PO), Take 1 capsule by mouth daily., Disp: , Rfl:    Omega-3 Fatty Acids (FISH OIL PO), Take 1 capsule by mouth daily., Disp: , Rfl:    tadalafil (CIALIS) 5 MG tablet, Take 1 tablet (5 mg total) by mouth daily., Disp: 90 tablet, Rfl: 3   traMADol (ULTRAM) 50 MG tablet, Take 1-2 tablets (50-100 mg total) by mouth every 8 (eight) hours as needed for moderate pain. Maximum 6 tabs per day., Disp: 21 tablet, Rfl: 0   valsartan-hydrochlorothiazide (DIOVAN-HCT) 320-25 MG tablet, Take 1 tablet by mouth daily., Disp: 90 tablet, Rfl: 3  EXAM:  VITALS per patient if applicable: BP (!) 563/14   Pulse 91   Ht '6\' 3"'$  (1.905 m)   Wt 285 lb (129.3 kg)   BMI 35.62 kg/m   GENERAL: alert, oriented, appears well and in no acute distress  HEENT: atraumatic, conjunttiva clear, no obvious abnormalities on inspection of external nose and ears  NECK: normal movements of the head and neck  LUNGS: on inspection no signs of respiratory distress, breathing rate appears normal, no obvious gross SOB, gasping or wheezing  CV: no obvious cyanosis  MS: moves all visible extremities without noticeable abnormality  PSYCH/NEURO: pleasant and cooperative, no obvious depression or anxiety, speech and thought processing grossly intact  ASSESSMENT AND PLAN:  Discussed the following assessment and plan:  Post-viral cough syndrome Has had some  wheezing, adding steroid burst and tessalon perles.  Additional supportive care with increased fluids, humidifier and vicks chest rub recommended.  Discussed that symptoms may last for several weeks.  Contact clinic if worsening.      I discussed the assessment and treatment plan with the patient. The patient was provided an opportunity to ask questions and all were answered. The patient agreed with the plan and demonstrated an understanding of the instructions.   The patient was advised to call back or seek an in-person evaluation if the symptoms worsen or if the condition fails to improve as anticipated.    Luetta Nutting, DO

## 2022-01-08 NOTE — Assessment & Plan Note (Signed)
Has had some wheezing, adding steroid burst and tessalon perles.  Additional supportive care with increased fluids, humidifier and vicks chest rub recommended.  Discussed that symptoms may last for several weeks.  Contact clinic if worsening.

## 2022-01-20 ENCOUNTER — Ambulatory Visit (INDEPENDENT_AMBULATORY_CARE_PROVIDER_SITE_OTHER): Payer: BC Managed Care – PPO | Admitting: Family Medicine

## 2022-01-20 ENCOUNTER — Encounter: Payer: Self-pay | Admitting: Family Medicine

## 2022-01-20 VITALS — BP 142/82 | HR 87 | Temp 97.7°F | Ht 75.0 in | Wt 295.5 lb

## 2022-01-20 DIAGNOSIS — I10 Essential (primary) hypertension: Secondary | ICD-10-CM

## 2022-01-20 DIAGNOSIS — R0981 Nasal congestion: Secondary | ICD-10-CM

## 2022-01-20 DIAGNOSIS — J0100 Acute maxillary sinusitis, unspecified: Secondary | ICD-10-CM | POA: Diagnosis not present

## 2022-01-20 DIAGNOSIS — R051 Acute cough: Secondary | ICD-10-CM

## 2022-01-20 MED ORDER — BENZONATATE 100 MG PO CAPS: 100.0000 mg | ORAL_CAPSULE | Freq: Two times a day (BID) | ORAL | 0 refills | Status: DC | PRN

## 2022-01-20 MED ORDER — AMOXICILLIN-POT CLAVULANATE 875-125 MG PO TABS: 1.0000 | ORAL_TABLET | Freq: Two times a day (BID) | ORAL | 0 refills | Status: DC

## 2022-01-20 MED ORDER — FLUTICASONE PROPIONATE 50 MCG/ACT NA SUSP: 2.0000 | Freq: Every day | NASAL | 6 refills | Status: DC

## 2022-01-20 NOTE — Patient Instructions (Signed)
You were prescribed an antibiotic today. Please complete the full course.  Acetaminophen or ibuprofen for fever according to package instructions, do not exceed recommended doses.  Adequate fluids to avoid dehydration. Lots of rest while you are recovering.  Follow-up if your symptoms do not improve.    Sinus rinses.  Flonase Fluids and rest.

## 2022-01-20 NOTE — Progress Notes (Signed)
Acute Office Visit  Subjective:     Patient ID: Jerry Escobar, male    DOB: 11/01/1970, 52 y.o.   MRN: [id number]  Chief Complaint  Patient presents with   Nasal Congestion    Symptoms started Saturday. Dark green sputum with some traces of blood when blowing nose.  He has taken Alka seltzer, muccinex, theraflu and other OTC medicines. Symptoms have not subsided.   Sinus pressure    HPI Presents today for an acute visit with complaint of nasal congestion with cough. Reports cough resolved from previous treatment in December, has returned.  When blowing nose, has traces of blood and dark nasal drainage.  Sinus pressure on left side of face.  Nasal symptoms worse upon awakening.  Reports he does not feel bad.  Symptoms have been present for 3 days ago, with cough returning from previous illness- wonders if this is from sinus drainage.  Associated symptoms include: no fever or chills, no shortness of breath.  Pain severity: 7/10 facial pressure  Treatments tried include : alka seltzer sinus congestion, mucinex, theraflu.  Treatment effective : alka seltzer helps the sinus pain and pressure Sick contacts : no Chart review: 01/08/21 video visit with PCP for post viral cough. Received prednisone and benzonatate at that visit which helped the cough. Needs refill of benzonatate today.   Review of Systems  Constitutional:  Negative for chills, fever and malaise/fatigue.  HENT:  Positive for congestion and sinus pain. Negative for ear pain.   Respiratory:  Positive for cough and sputum production. Negative for shortness of breath and wheezing.         Objective:    BP (!) 142/82 Comment: plans to take amlodipine asap at home  Pulse 87   Temp 97.7 F (36.5 C) (Oral)   Ht '6\' 3"'$  (1.905 m)   Wt 295 lb 8 oz (134 kg)   SpO2 96%   BMI 36.93 kg/m    Physical Exam Vitals and nursing note reviewed.  Constitutional:      General: He is not in acute distress.    Appearance: Normal  appearance. He is not ill-appearing.  HENT:     Right Ear: Tympanic membrane normal.     Left Ear: Tympanic membrane normal.     Nose:     Right Turbinates: Swollen.     Left Turbinates: Swollen.     Right Sinus: No maxillary sinus tenderness or frontal sinus tenderness.     Left Sinus: Maxillary sinus tenderness and frontal sinus tenderness present.     Mouth/Throat:     Pharynx: No oropharyngeal exudate or posterior oropharyngeal erythema.  Cardiovascular:     Rate and Rhythm: Normal rate and regular rhythm.     Heart sounds: Normal heart sounds.  Pulmonary:     Effort: Pulmonary effort is normal.     Breath sounds: Normal breath sounds.  Skin:    General: Skin is warm and dry.  Neurological:     General: No focal deficit present.     Mental Status: He is alert. Mental status is at baseline.     No results found for any visits on 01/20/22.      Assessment & Plan:   Problem List Items Addressed This Visit     Elevated blood pressure reading in office with diagnosis of hypertension   Acute non-recurrent maxillary sinusitis - Primary   Relevant Medications   amoxicillin-clavulanate (AUGMENTIN) 875-125 MG tablet   benzonatate (TESSALON) 100 MG capsule   fluticasone (  FLONASE) 50 MCG/ACT nasal spray   Acute cough   Relevant Medications   benzonatate (TESSALON) 100 MG capsule   Nasal congestion   Relevant Medications   fluticasone (FLONASE) 50 MCG/ACT nasal spray    Meds ordered this encounter  Medications   amoxicillin-clavulanate (AUGMENTIN) 875-125 MG tablet    Sig: Take 1 tablet by mouth 2 (two) times daily.    Dispense:  20 tablet    Refill:  0    Order Specific Question:   Supervising Provider    Answer:   Leeanne Rio [[id number]]   benzonatate (TESSALON) 100 MG capsule    Sig: Take 1 capsule (100 mg total) by mouth 2 (two) times daily as needed for cough.    Dispense:  20 capsule    Refill:  0    Order Specific Question:   Supervising Provider     Answer:   Leeanne Rio [[id number]]   fluticasone (FLONASE) 50 MCG/ACT nasal spray    Sig: Place 2 sprays into both nostrils daily.    Dispense:  16 g    Refill:  6    Order Specific Question:   Supervising Provider    Answer:   Leeanne Rio [[id number]]  Left side maxillary and frontal sinus pain with dark nasal drainage.  No fever.  Antibiotic as instructed.  08/05/21: GFR 94 Sinus rinses. Flonase for nasal congestion.   Cough, refill of benzonatate sent to pharmacy. Lungs clear, no shortness of breath.   Elevated blood pressure, has not taken amlodipine today. Agrees to take as soon as he gets home today. BP improved with recheck with large cuff.   Return if symptoms worsen or fail to improve.  Chalmers Guest, FNP

## 2022-01-21 ENCOUNTER — Institutional Professional Consult (permissible substitution): Payer: BC Managed Care – PPO | Admitting: Plastic Surgery

## 2022-01-22 DIAGNOSIS — F411 Generalized anxiety disorder: Secondary | ICD-10-CM | POA: Diagnosis not present

## 2022-01-29 DIAGNOSIS — F411 Generalized anxiety disorder: Secondary | ICD-10-CM | POA: Diagnosis not present

## 2022-02-12 DIAGNOSIS — F411 Generalized anxiety disorder: Secondary | ICD-10-CM | POA: Diagnosis not present

## 2022-02-16 ENCOUNTER — Other Ambulatory Visit: Payer: Self-pay | Admitting: Sports Medicine

## 2022-02-16 DIAGNOSIS — I10 Essential (primary) hypertension: Secondary | ICD-10-CM

## 2022-02-16 NOTE — Progress Notes (Unsigned)
Patient: Jerry Escobar  Service Category: E/M  Provider: Gaspar Cola, MD  DOB: 01-18-1971  DOS: 02/18/2022  Referring Provider: Camillia Herter, MD  MRN: [id number]  Setting: Ambulatory outpatient  PCP: Silverio Decamp, MD  Type: New Patient  Specialty: Interventional Pain Management    Location: Office  Delivery: Face-to-face     Primary Reason(s) for Visit: Encounter for initial evaluation of one or more chronic problems (new to examiner) potentially causing chronic pain, and posing a threat to normal musculoskeletal function. (Level of risk: High) CC: No chief complaint on file.  HPI  Jerry Escobar is a 52 y.o. year old, male patient, who comes for the first time to our practice referred by Camillia Herter, MD for our initial evaluation of his chronic pain. He has Osteoarthritis of left hip; Annual physical exam; Elevated blood pressure reading in office with diagnosis of hypertension; Obesity; Hyperlipidemia; Transaminitis; Prediabetes; Right-sided chest wall subcutaneous mass; Lumbar degenerative disc disease; Rotator cuff tear, left; Erectile dysfunction; Obstructive sleep apnea; Carpal tunnel syndrome, bilateral; Positive colorectal cancer screening using Cologuard test; Degenerative tear of meniscus, left; Left hand pain; Post-viral cough syndrome; Acute non-recurrent maxillary sinusitis; Acute cough; and Nasal congestion on their problem list. Today he comes in for evaluation of his No chief complaint on file.  Pain Assessment: Location:     Radiating:   Onset:   Duration:   Quality:   Severity:  /10 (subjective, self-reported pain score)  Effect on ADL:   Timing:   Modifying factors:   BP:    HR:    Onset and Duration: {Hx; Onset and Duration:210120511} Cause of pain: {Hx; Cause:210120521} Severity: {Pain Severity:210120502} Timing: {Symptoms; Timing:210120501} Aggravating Factors: {Causes; Aggravating pain factors:210120507} Alleviating Factors: {Causes;  Alleviating Factors:210120500} Associated Problems: {Hx; Associated problems:210120515} Quality of Pain: {Hx; Symptom quality or Descriptor:210120531} Previous Examinations or Tests: {Hx; Previous examinations or test:210120529} Previous Treatments: {Hx; Previous Treatment:210120503}  Jerry Escobar is being evaluated for possible interventional pain management therapies for the treatment of his chronic pain.ted for possible interventional pain management therapies for the treatment of his chronic pain.   ***  Jerry Escobar has been informed that this initial visit was an evaluation only.  On the follow up appointment I will go over the results, including ordered tests and available interventional therapies. At that time he will have the opportunity to decide whether to proceed with offered therapies or not. In the event that Jerry Escobar prefers avoiding interventional options, this will conclude our involvement in the case.  Medication management recommendations may be provided upon request.  Historic Controlled Substance Pharmacotherapy Review  PMP and historical list of controlled substances: ***  Most recently prescribed opioid analgesics:   *** MME/day: *** mg/day  Historical Monitoring: The patient  reports no history of drug use. List of prior UDS Testing: No results found for: "MDMA", "COCAINSCRNUR", "PCPSCRNUR", "PCPQUANT", "CANNABQUANT", "THCU", "ETH", "CBDTHCR", "D8THCCBX", "D9THCCBX" Historical Background Evaluation: [place] PMP: PDMP reviewed during this encounter. Review of the past 52-month conducted.             PMP NARX Score Report:  Narcotic: *** Sedative: *** Stimulant: *** [place] Department of public safety, offender search: (Editor, commissioningInformation) Non-contributory Risk Assessment Profile: Aberrant behavior: None observed or detected today Risk factors for fatal opioid overdose: None identified today PMP NARX Overdose Risk Score: *** Fatal overdose hazard ratio (HR): Calculation deferred Non-fatal overdose hazard ratio (HR): Calculation deferred Risk of opioid abuse or  dependence: 0.7-3.0% with doses ? 36 MME/day and 6.1-26% with doses ? 120 MME/day. Substance use disorder (SUD) risk  level: See below Personal History of Substance Abuse (SUD-Substance use disorder):  Alcohol:    Illegal Drugs:    Rx Drugs:    ORT Risk Level calculation:    ORT Scoring interpretation table:  Score <3 = Low Risk for SUD  Score between 4-7 = Moderate Risk for SUD  Score >8 = High Risk for Opioid Abuse   PHQ-2 Depression Scale:  Total score:    PHQ-2 Scoring interpretation table: (Score and probability of major depressive disorder)  Score 0 = No depression  Score 1 = 15.4% Probability  Score 2 = 21.1% Probability  Score 3 = 38.4% Probability  Score 4 = 45.5% Probability  Score 5 = 56.4% Probability  Score 6 = 78.6% Probability   PHQ-9 Depression Scale:  Total score:    PHQ-9 Scoring interpretation table:  Score 0-4 = No depression  Score 5-9 = Mild depression  Score 10-14 = Moderate depression  Score 15-19 = Moderately severe depression  Score 20-27 = Severe depression (2.4 times higher risk of SUD and 2.89 times higher risk of overuse)   Pharmacologic Plan: As per protocol, I have not taken over any controlled substance management, pending the results of ordered tests and/or consults.            Initial impression: Pending review of available data and ordered tests.  Meds   Current Outpatient Medications:    acetaminophen (TYLENOL) 650 MG CR tablet, Take 1 tablet (650 mg total) by mouth every 8 (eight) hours as needed for pain., Disp: 90 tablet, Rfl: 3   amLODipine (NORVASC) 10 MG tablet, TAKE 1 TABLET DAILY, Disp: 90 tablet, Rfl: 3   amoxicillin-clavulanate (AUGMENTIN) 875-125 MG tablet, Take 1 tablet by mouth 2 (two) times daily., Disp: 20 tablet, Rfl: 0   Ascorbic Acid (VITAMIN C) 1000 MG tablet, Take 1,000 mg by mouth daily., Disp: , Rfl:    benzonatate (TESSALON) 100 MG capsule, Take 1 capsule (100 mg total) by mouth 2 (two) times daily as needed for  cough., Disp: 20 capsule, Rfl: 0   fluticasone (FLONASE) 50 MCG/ACT nasal spray, Place 2 sprays into both nostrils daily., Disp: 16 g, Rfl: 6   ibuprofen (ADVIL) 800 MG tablet, Take 1 tablet (800 mg total) by mouth every 8 (eight) hours as needed. (Patient not taking: Reported on 01/20/2022), Disp: 90 tablet, Rfl: 2   meloxicam (MOBIC) 15 MG tablet, Take 1 tablet (15 mg total) by mouth daily with supper. (Patient not taking: Reported on 01/20/2022), Disp: , Rfl:    Multiple Vitamins-Minerals (MULTIVITAMIN ADULT PO), Take 1 capsule by mouth daily., Disp: , Rfl:    Omega-3 Fatty Acids (FISH OIL PO), Take 1 capsule by mouth daily., Disp: , Rfl:    predniSONE (DELTASONE) 50 MG tablet, Take '50mg'$  daily x5 days. (Patient not taking: Reported on 01/20/2022), Disp: 5 tablet, Rfl: 0   tadalafil (CIALIS) 5 MG tablet, Take 1 tablet (5 mg total) by mouth daily., Disp: 90 tablet, Rfl: 3   traMADol (ULTRAM) 50 MG tablet, Take 1-2 tablets (50-100 mg total) by mouth every 8 (eight) hours as needed for moderate pain. Maximum 6 tabs per day., Disp: 21 tablet, Rfl: 0   valsartan-hydrochlorothiazide (DIOVAN-HCT) 320-25 MG tablet, TAKE 1 TABLET DAILY, Disp: 90 tablet, Rfl: 3  Imaging Review  Cervical Imaging: Cervical MR wo contrast: No results found for this or any previous visit.  Cervical MR wo contrast: No valid procedures specified. Cervical MR w/wo contrast: No results found for this or  any previous visit.  Cervical MR w contrast: No results found for this or any previous visit.  Cervical CT wo contrast: No results found for this or any previous visit.  Cervical CT w/wo contrast: No results found for this or any previous visit.  Cervical CT w/wo contrast: No results found for this or any previous visit.  Cervical CT w contrast: No results found for this or any previous visit.  Cervical CT outside: No results found for this or any previous visit.  Cervical DG 1 view: No results found for this or any previous  visit.  Cervical DG 2-3 views: No results found for this or any previous visit.  Cervical DG F/E views: No results found for this or any previous visit.  Cervical DG 2-3 clearing views: No results found for this or any previous visit.  Cervical DG Bending/F/E views: No results found for this or any previous visit.  Cervical DG complete: No results found for this or any previous visit.  Cervical DG Myelogram views: No results found for this or any previous visit.  Cervical DG Myelogram views: No results found for this or any previous visit.  Cervical Discogram views: No results found for this or any previous visit.   Shoulder Imaging: Shoulder-R MR w contrast: No results found for this or any previous visit.  Shoulder-L MR w contrast: No results found for this or any previous visit.  Shoulder-R MR w/wo contrast: No results found for this or any previous visit.  Shoulder-L MR w/wo contrast: No results found for this or any previous visit.  Shoulder-R MR wo contrast: No results found for this or any previous visit.  Shoulder-L MR wo contrast: Results for orders placed in visit on 09/11/18  MR Shoulder Left Wo Contrast  Narrative CLINICAL DATA:  Left shoulder pain and limited range of motion for the past 3 months. Prior rotator cuff repair.  EXAM: MRI OF THE LEFT SHOULDER WITHOUT CONTRAST  TECHNIQUE: Multiplanar, multisequence MR imaging of the shoulder was performed. No intravenous contrast was administered.  COMPARISON:  Left shoulder x-rays dated September 05, 2018.  FINDINGS: Rotator cuff: Full-thickness, partial width tear of the anterior supraspinatus tendon, with almost 2 cm of retraction to the top of the humeral head. Small intrasubstance tear of the proximal infraspinatus tendon. The teres minor and subscapularis tendons are unremarkable.  Muscles: No atrophy or abnormal signal of the muscles of the rotator cuff.  Biceps long head: Intact and normally  positioned. Intra-articular tendinosis.  Acromioclavicular Joint: Minimal arthropathy of the acromioclavicular joint. Type II acromion. Small subacromial/subdeltoid bursal fluid.  Glenohumeral Joint: No joint effusion. No chondral defect.  Labrum: Grossly intact, but evaluation is limited by lack of intraarticular fluid.  Bones: No acute fracture or dislocation. No suspicious bone lesion.  Other: None.  IMPRESSION: 1. Full-thickness, partial width tear of the supraspinatus tendon. 2. Small intrasubstance tear of the proximal infraspinatus tendon. 3. Intra-articular biceps tendinosis.   Electronically Signed By: Titus Dubin M.D. On: 09/12/2018 08:10  Shoulder-R CT w contrast: No results found for this or any previous visit.  Shoulder-L CT w contrast: No results found for this or any previous visit.  Shoulder-R CT w/wo contrast: No results found for this or any previous visit.  Shoulder-L CT w/wo contrast: No results found for this or any previous visit.  Shoulder-R CT wo contrast: No results found for this or any previous visit.  Shoulder-L CT wo contrast: No results found for this or any previous visit.  Shoulder-R DG Arthrogram: No results found for this or any previous visit.  Shoulder-L DG Arthrogram: No results found for this or any previous visit.  Shoulder-R DG 1 view: No results found for this or any previous visit.  Shoulder-L DG 1 view: No results found for this or any previous visit.  Shoulder-R DG: No results found for this or any previous visit.  Shoulder-L DG: Results for orders placed in visit on 09/05/18  DG Shoulder Left  Narrative CLINICAL DATA:  Left shoulder pain, no known injury  EXAM: LEFT SHOULDER - 2+ VIEW  COMPARISON:  None.  FINDINGS: No acute fracture or dislocation of the left shoulder. There is mild glenohumeral arthrosis. The acromioclavicular joint is preserved. There is an acromial osteophyte which does not appear to  be significantly downhanging and irregularity of the greater tuberosity of the left humerus, of uncertain nature although possibly related to prior trauma. Consider MRI to further evaluate chronic shoulder pain.  IMPRESSION: No acute fracture or dislocation of the left shoulder. There is mild glenohumeral arthrosis. The acromioclavicular joint is preserved. There is an acromial osteophyte which does not appear to be significantly downhanging and irregularity of the greater tuberosity of the left humerus, of uncertain nature although possibly related to prior trauma. Consider MRI to further evaluate chronic shoulder pain.   Electronically Signed By: Eddie Candle M.D. On: 09/05/2018 12:29   Thoracic Imaging: Thoracic MR wo contrast: No results found for this or any previous visit.  Thoracic MR wo contrast: No valid procedures specified. Thoracic MR w/wo contrast: No results found for this or any previous visit.  Thoracic MR w contrast: No results found for this or any previous visit.  Thoracic CT wo contrast: No results found for this or any previous visit.  Thoracic CT w/wo contrast: No results found for this or any previous visit.  Thoracic CT w/wo contrast: No results found for this or any previous visit.  Thoracic CT w contrast: No results found for this or any previous visit.  Thoracic DG 2-3 views: No results found for this or any previous visit.  Thoracic DG 4 views: No results found for this or any previous visit.  Thoracic DG: No results found for this or any previous visit.  Thoracic DG w/swimmers view: Results for orders placed in visit on 08/05/21  [facility] Thoracic Spine W/Swimmers  Narrative CLINICAL DATA:  Right upper back pain.  EXAM: THORACIC SPINE - 3 VIEWS  COMPARISON:  Chest radiograph dated 01/03/2021.  FINDINGS: There is no acute fracture or subluxation of the thoracic spine. Mild degenerative changes. The visualized posterior elements  are intact. The soft tissues are unremarkable.  IMPRESSION: No acute thoracic spine pathology.   Electronically Signed By: Anner Crete M.D. On: 08/05/2021 22:23  Thoracic DG Myelogram views: No results found for this or any previous visit.  Thoracic DG Myelogram views: No results found for this or any previous visit.   Lumbosacral Imaging: Lumbar MR wo contrast: No results found for this or any previous visit.  Lumbar MR wo contrast: No valid procedures specified. Lumbar MR w/wo contrast: No results found for this or any previous visit.  Lumbar MR w/wo contrast: No results found for this or any previous visit.  Lumbar MR w contrast: No results found for this or any previous visit.  Lumbar CT wo contrast: No results found for this or any previous visit.  Lumbar CT w/wo contrast: No results found for this or any previous visit.  Lumbar CT  w/wo contrast: No results found for this or any previous visit.  Lumbar CT w contrast: No results found for this or any previous visit.  Lumbar DG 1V: No results found for this or any previous visit.  Lumbar DG 1V (Clearing): No results found for this or any previous visit.  Lumbar DG 2-3V (Clearing): No results found for this or any previous visit.  Lumbar DG 2-3 views: No results found for this or any previous visit.  Lumbar DG (Complete) 4+V: Results for orders placed in visit on 08/05/21  DG Lumbar Spine Complete  Narrative CLINICAL DATA:  Low back pain  EXAM: LUMBAR SPINE - COMPLETE 4+ VIEW  COMPARISON:  None Available.  FINDINGS: Lumbar vertebral body height are preserved without evidence of fracture. No spondylolysis or spondylolisthesis identified. Mild intervertebral disc space narrowing at L3-L4 and L4-L5, and moderate narrowing at L5-S1. Facet arthropathy in the lower lumbar spine.  IMPRESSION: Degenerative changes of the lumbar spine as described.   Electronically Signed By: Ofilia Neas M.D. On:  08/05/2021 13:20        Lumbar DG F/E views: No results found for this or any previous visit.        Lumbar DG Bending views: No results found for this or any previous visit.        Lumbar DG Myelogram views: No results found for this or any previous visit.  Lumbar DG Myelogram: No results found for this or any previous visit.  Lumbar DG Myelogram: No results found for this or any previous visit.  Lumbar DG Myelogram: No results found for this or any previous visit.  Lumbar DG Myelogram Lumbosacral: No results found for this or any previous visit.  Lumbar DG Diskogram views: No results found for this or any previous visit.  Lumbar DG Diskogram views: No results found for this or any previous visit.  Lumbar DG Epidurogram OP: No results found for this or any previous visit.  Lumbar DG Epidurogram IP: No valid procedures specified.  Sacroiliac Joint Imaging: Sacroiliac Joint DG: No results found for this or any previous visit.  Sacroiliac Joint MR w/wo contrast: No results found for this or any previous visit.  Sacroiliac Joint MR wo contrast: No results found for this or any previous visit.   Spine Imaging: Whole Spine DG Myelogram views: No results found for this or any previous visit.  Whole Spine MR Mets screen: No results found for this or any previous visit.  Whole Spine MR Mets screen: No results found for this or any previous visit.  Whole Spine MR w/wo: No results found for this or any previous visit.  MRA Spinal Canal w/ cm: No results found for this or any previous visit.  MRA Spinal Canal wo/ cm: No valid procedures specified. MRA Spinal Canal w/wo cm: No results found for this or any previous visit.  Spine Outside MR Films: No results found for this or any previous visit.  Spine Outside CT Films: No results found for this or any previous visit.  CT-Guided Biopsy: No results found for this or any previous visit.  CT-Guided Needle Placement: No results  found for this or any previous visit.  DG Spine outside: No results found for this or any previous visit.  IR Spine outside: No results found for this or any previous visit.  NM Spine outside: No results found for this or any previous visit.   Hip Imaging: Hip-R MR w contrast: No results found for this or any previous  visit.  Hip-L MR w contrast: No results found for this or any previous visit.  Hip-R MR w/wo contrast: No results found for this or any previous visit.  Hip-L MR w/wo contrast: No results found for this or any previous visit.  Hip-R MR wo contrast: No results found for this or any previous visit.  Hip-L MR wo contrast: No results found for this or any previous visit.  Hip-R CT w contrast: No results found for this or any previous visit.  Hip-L CT w contrast: No results found for this or any previous visit.  Hip-R CT w/wo contrast: No results found for this or any previous visit.  Hip-L CT w/wo contrast: No results found for this or any previous visit.  Hip-R CT wo contrast: No results found for this or any previous visit.  Hip-L CT wo contrast: No results found for this or any previous visit.  Hip-R DG 2-3 views: No results found for this or any previous visit.  Hip-L DG 2-3 views: No results found for this or any previous visit.  Hip-R DG Arthrogram: No results found for this or any previous visit.  Hip-L DG Arthrogram: No results found for this or any previous visit.  Hip-B DG Bilateral: No results found for this or any previous visit.   Knee Imaging: Knee-R MR w contrast: No results found for this or any previous visit.  Knee-L MR w/o contrast: Results for orders placed in visit on 06/03/20  MR KNEE LEFT WO CONTRAST  Narrative CLINICAL DATA:  Knee pain, chronic, degenerative disease on xray (Age >= 5y) Question meniscal tearing  EXAM: MRI OF THE LEFT KNEE WITHOUT CONTRAST  TECHNIQUE: Multiplanar, multisequence MR imaging of the knee was  performed. No intravenous contrast was administered.  COMPARISON:  Left knee radiograph 04/03/2020  FINDINGS: MENISCI  Medial: There is degenerative intrasubstance signal at the posterior horn-body junction of the medial meniscus without discrete meniscal tear.  Lateral: There is degenerative tearing of the posterior horn and body of the lateral meniscus, and likely tear of the posterior meniscal root. Degenerative signal of the anterior horn near the root. Mild extrusion.  LIGAMENTS  Cruciates: ACL and PCL are intact.  Collaterals: Medial collateral ligament is intact. Lateral collateral ligament complex is intact.  CARTILAGE  Patellofemoral: There is fissuring along the median patellar ridge and adjacent subchondral edema.  Medial:  Mild chondrosis.  Lateral: Overall mild cartilage thinning with focal near full-thickness chondral defect measuring 0.6 x 0.4 cm along the weight-bearing lateral femoral condyle.  JOINT: Small joint effusion.  POPLITEAL FOSSA: No Baker's cyst.  EXTENSOR MECHANISM: Intact quadriceps tendon. Intact patellar tendon. Intact lateral patellar retinaculum. Intact medial patellar retinaculum. Intact MPFL.  BONES: Tricompartment osteophyte formation. There is a T2 hyperintense lesion with stippled low signal internally in the distal femoral metaphysis compatible with chondral rest/enchondroma. There is no acute fracture or dislocation.  Other: No fluid collection or hematoma. Muscles are normal.  IMPRESSION: Tricompartment osteoarthritis. Degenerative tearing of the posterior horn and body of the lateral meniscus likely involving the posterior meniscal root. Mild extrusion.  Focal near full-thickness chondral defect in the weight-bearing lateral femoral condyle measuring 0.6 x 0.4 cm.  Degenerative intrasubstance signal at the posterior horn-body junction of the medial meniscus without discrete meniscal tear.  Small joint effusion.  Mild chondrosis in the medial and patellofemoral compartments.   Electronically Signed By: Maurine Simmering On: 06/03/2020 16:53  Knee-R MR w/wo contrast: No results found for this or any previous  visit.  Knee-L MR w/wo contrast: No results found for this or any previous visit.  Knee-R MR wo contrast: No results found for this or any previous visit.  Knee-L MR wo contrast: No results found for this or any previous visit.  Knee-R CT w contrast: No results found for this or any previous visit.  Knee-L CT w contrast: No results found for this or any previous visit.  Knee-R CT w/wo contrast: No results found for this or any previous visit.  Knee-L CT w/wo contrast: No results found for this or any previous visit.  Knee-R CT wo contrast: No results found for this or any previous visit.  Knee-L CT wo contrast: No results found for this or any previous visit.  Knee-R DG 1-2 views: Results for orders placed in visit on 04/03/20  DG Knee 1-2 Views Right  Narrative CLINICAL DATA:  Left knee pain  EXAM: RIGHT KNEE - 1-2 VIEW; LEFT KNEE - COMPLETE 4+ VIEW  COMPARISON:  Femur radiograph 11/09/2017  FINDINGS: Right knee: No fracture or malalignment. Mild tricompartment arthritis of the knee with small effusion.  Left knee: No fracture or malalignment. Mild tricompartment arthritis with small knee effusion  IMPRESSION: No acute osseous abnormality. Mild tricompartment arthritis of the knees with small effusion.   Electronically Signed By: Donavan Foil M.D. On: 04/04/2020 20:49  Knee-L DG 1-2 views: No results found for this or any previous visit.  Knee-R DG 3 views: No results found for this or any previous visit.  Knee-L DG 3 views: No results found for this or any previous visit.  Knee-R DG 4 views: No results found for this or any previous visit.  Knee-L DG 4 views: Results for orders placed in visit on 04/03/20  DG Knee Complete 4 Views Left  Narrative CLINICAL  DATA:  Left knee pain  EXAM: RIGHT KNEE - 1-2 VIEW; LEFT KNEE - COMPLETE 4+ VIEW  COMPARISON:  Femur radiograph 11/09/2017  FINDINGS: Right knee: No fracture or malalignment. Mild tricompartment arthritis of the knee with small effusion.  Left knee: No fracture or malalignment. Mild tricompartment arthritis with small knee effusion  IMPRESSION: No acute osseous abnormality. Mild tricompartment arthritis of the knees with small effusion.   Electronically Signed By: Donavan Foil M.D. On: 04/04/2020 20:49  Knee-R DG Arthrogram: No results found for this or any previous visit.  Knee-L DG Arthrogram: No results found for this or any previous visit.   Ankle Imaging: Ankle-R DG Complete: No results found for this or any previous visit.  Ankle-L DG Complete: No results found for this or any previous visit.   Foot Imaging: Foot-R DG Complete: No results found for this or any previous visit.  Foot-L DG Complete: No results found for this or any previous visit.   Elbow Imaging: Elbow-R DG Complete: No results found for this or any previous visit.  Elbow-L DG Complete: No results found for this or any previous visit.   Wrist Imaging: Wrist-R DG Complete: No results found for this or any previous visit.  Wrist-L DG Complete: No results found for this or any previous visit.   Hand Imaging: Hand-R DG Complete: No results found for this or any previous visit.  Hand-L DG Complete: Results for orders placed in visit on 08/05/21  DG Hand Complete Left  Narrative CLINICAL DATA:  Pain in the left second metacarpophalangeal joint.  EXAM: LEFT HAND - COMPLETE 3+ VIEW  COMPARISON:  None Available.  FINDINGS: There is no evidence of  fracture or dislocation. There is no evidence of arthropathy or other focal bone abnormality. Soft tissues are unremarkable.  IMPRESSION: Negative.   Electronically Signed By: Anner Crete M.D. On: 08/05/2021 22:26   Complexity  Note: Imaging results reviewed.                         ROS  Cardiovascular: {Hx; Cardiovascular History:210120525} Pulmonary or Respiratory: {Hx; Pumonary and/or Respiratory History:210120523} Neurological: {Hx; Neurological:210120504} Psychological-Psychiatric: {Hx; Psychological-Psychiatric History:210120512} Gastrointestinal: {Hx; Gastrointestinal:210120527} Genitourinary: {Hx; Genitourinary:210120506} Hematological: {Hx; Hematological:210120510} Endocrine: {Hx; Endocrine history:210120509} Rheumatologic: {Hx; Rheumatological:210120530} Musculoskeletal: {Hx; Musculoskeletal:210120528} Work History: {Hx; Work history:210120514}  Allergies  Mr. Geier is allergic to ace inhibitors.  Laboratory Chemistry Profile   Renal Lab Results  Component Value Date   BUN 15 08/05/2021   CREATININE 0.98 37/10/6267   BCR NOT APPLICABLE 48/54/6270   GFRAA 80 05/17/2019   GFRNONAA 69 05/17/2019   SPECGRAV >=1.030 02/11/2013   PHUR 6.0 02/11/2013   PROTEINUR NEGATIVE 08/05/2021     Electrolytes Lab Results  Component Value Date   NA 139 08/05/2021   K 3.9 08/05/2021   CL 106 08/05/2021   CALCIUM 9.0 08/05/2021     Hepatic Lab Results  Component Value Date   AST 47 (H) 08/05/2021   ALT 68 (H) 08/05/2021   ALBUMIN 4.5 04/09/2016   ALKPHOS 55 04/09/2016     ID Lab Results  Component Value Date   HIV NONREACTIVE 04/09/2016   SARSCOV2NAA NOT DETECTED 10/03/2018   HCVAB NEGATIVE 02/07/2014     Bone Lab Results  Component Value Date   VD25OH 12 (L) 10/17/2018     Endocrine Lab Results  Component Value Date   GLUCOSE 87 08/05/2021   GLUCOSEU NEGATIVE 08/05/2021   HGBA1C 5.5 02/21/2021   TSH 1.22 02/21/2021     Neuropathy Lab Results  Component Value Date   HGBA1C 5.5 02/21/2021   HIV NONREACTIVE 04/09/2016     CNS No results found for: "COLORCSF", "APPEARCSF", "RBCCOUNTCSF", "WBCCSF", "POLYSCSF", "LYMPHSCSF", "EOSCSF", "PROTEINCSF", "GLUCCSF", "JCVIRUS",  "CSFOLI", "IGGCSF", "LABACHR", "ACETBL"   Inflammation (CRP: Acute  ESR: Chronic) No results found for: "CRP", "ESRSEDRATE", "LATICACIDVEN"   Rheumatology Lab Results  Component Value Date   ANA NEG 02/20/2014     Coagulation Lab Results  Component Value Date   PLT 195 08/05/2021   DDIMER 0.23 05/17/2019     Cardiovascular Lab Results  Component Value Date   BNP <4 05/17/2019   HGB 14.3 08/05/2021   HCT 43.6 08/05/2021     Screening Lab Results  Component Value Date   SARSCOV2NAA NOT DETECTED 10/03/2018   COVIDSOURCE NASOPHARYNGEAL 10/03/2018   HCVAB NEGATIVE 02/07/2014   HIV NONREACTIVE 04/09/2016     Cancer No results found for: "CEA", "CA125", "LABCA2"   Allergens No results found for: "ALMOND", "APPLE", "ASPARAGUS", "AVOCADO", "BANANA", "BARLEY", "BASIL", "BAYLEAF", "GREENBEAN", "LIMABEAN", "WHITEBEAN", "BEEFIGE", "REDBEET", "BLUEBERRY", "BROCCOLI", "CABBAGE", "MELON", "CARROT", "CASEIN", "CASHEWNUT", "CAULIFLOWER", "CELERY"     Note: Lab results reviewed.  [place]  Drug: Mr. Santibanez  reports no history of drug use. Alcohol:  reports current alcohol use of about 3.0 standard drinks of alcohol per week. Tobacco:  reports that he has an unknown smoking status. He has never used smokeless tobacco. Medical:  has a past medical history of Arthritis, Hyperlipidemia, Hypertension, Seasonal allergies, and Transaminitis. Family: family history includes Breast cancer in his paternal grandmother; Diverticulitis in his father; Prostate cancer in his father.  Past Surgical History:  Procedure Laterality  Date   BICEPT TENODESIS Left 10/06/2018   Procedure: BICEPS TENODESIS;  Surgeon: Hiram Gash, MD;  Location: [place];  Service: Orthopedics;  Laterality: Left;   HERNIA REPAIR     KNEE ARTHROSCOPY Bilateral    ROTATOR CUFF REPAIR Bilateral    SHOULDER ARTHROSCOPY WITH ROTATOR CUFF REPAIR AND SUBACROMIAL DECOMPRESSION Left 10/06/2018   Procedure: LEFT  SHOULDER ARTHROSCOPY WITH ROTATOR CUFF REPAIR AND SUBACROMIAL DECOMPRESSION, EXTENSIVE DEBRIDEMENT, BICEP TENODESIS;  Surgeon: Hiram Gash, MD;  Location: [place];  Service: Orthopedics;  Laterality: Left;  BLOCK   Active Ambulatory Problems    Diagnosis Date Noted   Osteoarthritis of left hip 09/18/2013   Annual physical exam 01/15/2014   Elevated blood pressure reading in office with diagnosis of hypertension 01/15/2014   Obesity 01/15/2014   Hyperlipidemia 01/16/2014   Transaminitis 01/16/2014   Prediabetes 01/16/2014   Right-sided chest wall subcutaneous mass 07/01/2016   Lumbar degenerative disc disease 11/24/2016   Rotator cuff tear, left 09/05/2018   Erectile dysfunction 12/26/2018   Obstructive sleep apnea 02/03/2019   Carpal tunnel syndrome, bilateral 05/17/2019   Positive colorectal cancer screening using Cologuard test 02/27/2020   Degenerative tear of meniscus, left 04/03/2020   Left hand pain 08/05/2021   Post-viral cough syndrome 01/08/2022   Acute non-recurrent maxillary sinusitis 01/20/2022   Acute cough 01/20/2022   Nasal congestion 01/20/2022   Resolved Ambulatory Problems    Diagnosis Date Noted   Meralgia paresthetica of right side 11/27/2013   Chest pain 01/08/2014   Eyelid abnormality 04/09/2016   Numbness and tingling of left side of face 10/17/2018   Chest tightness 05/17/2019   Whiplash injury to neck 07/04/2019   Internal hordeolum of left eye 10/04/2019   COVID-19 12/22/2019   Bronchitis 01/03/2021   Bilateral hearing loss due to cerumen impaction 02/21/2021   Past Medical History:  Diagnosis Date   Arthritis    Hypertension    Seasonal allergies    Constitutional Exam  General appearance: Well nourished, well developed, and well hydrated. In no apparent acute distress There were no vitals filed for this visit. BMI Assessment: Estimated body mass index is 36.93 kg/m as calculated from the following:   Height as of 01/20/22:  '6\' 3"'$  (1.905 m).   Weight as of 01/20/22: 295 lb 8 oz (134 kg).  BMI interpretation table: BMI level Category Range association with higher incidence of chronic pain  <18 kg/m2 Underweight   18.5-24.9 kg/m2 Ideal body weight   25-29.9 kg/m2 Overweight Increased incidence by 20%  30-34.9 kg/m2 Obese (Class I) Increased incidence by 68%  35-39.9 kg/m2 Severe obesity (Class II) Increased incidence by 136%  >40 kg/m2 Extreme obesity (Class III) Increased incidence by 254%   Patient's current BMI Ideal Body weight  There is no height or weight on file to calculate BMI. Patient weight not recorded   BMI Readings from Last 4 Encounters:  01/20/22 36.93 kg/m  01/08/22 35.62 kg/m  12/19/21 36.65 kg/m  11/13/21 36.87 kg/m   Wt Readings from Last 4 Encounters:  01/20/22 295 lb 8 oz (134 kg)  01/08/22 285 lb (129.3 kg)  12/19/21 293 lb 3.2 oz (133 kg)  11/13/21 295 lb (133.8 kg)    Psych/Mental status: Alert, oriented x 3 (person, place, & time)       Eyes: PERLA Respiratory: No evidence of acute respiratory distress  Assessment  Primary Diagnosis & Pertinent Problem List: There were no encounter diagnoses.  Visit Diagnosis (New  problems to examiner): No diagnosis found. Plan of Care (Initial workup plan)  Note: Mr. Busk was reminded that as per protocol, today's visit has been an evaluation only. We have not taken over the patient's controlled substance management.  Problem-specific plan: No problem-specific Assessment & Plan notes found for this encounter.  Lab Orders  No laboratory test(s) ordered today   Imaging Orders  No imaging studies ordered today   Referral Orders  No referral(s) requested today   Procedure Orders    No procedure(s) ordered today   Pharmacotherapy (current): Medications ordered:  No orders of the defined types were placed in this encounter.  Medications administered during this visit: [facility] Has had no medications administered during  this visit.   Analgesic Pharmacotherapy:  Opioid Analgesics: For patients currently taking or requesting to take opioid analgesics, in accordance with [place], we will assess their risks and indications for the use of these substances. After completing our evaluation, we may offer recommendations, but we no longer take patients for medication management. The prescribing physician will ultimately decide, based on his/her training and level of comfort whether to adopt any of the recommendations, including whether or not to prescribe such medicines.  Membrane stabilizer: To be determined at a later time  Muscle relaxant: To be determined at a later time  NSAID: To be determined at a later time  Other analgesic(s): To be determined at a later time   Interventional management options: Mr. Skibinski was informed that there is no guarantee that he would be a candidate for interventional therapies. The decision will be based on the results of diagnostic studies, as well as Mr. Otterness risk profile.  Procedure(s) under consideration:  Pending results of ordered studies      Interventional Therapies  Risk Factors  Considerations:     Planned  Pending:   See above for possible orders   Under consideration:   Pending completion of evaluation   Completed:   None at this time   Completed by other providers:   None at this time   Therapeutic  Palliative (PRN) options:   None established      Provider-requested follow-up: No follow-ups on file.  Future Appointments  Date Time Provider [place]  02/18/2022  2:00 PM Milinda Pointer, MD [facility] None    Duration of encounter: *** minutes.  Total time on encounter, as per AMA guidelines included both the face-to-face and non-face-to-face time personally spent by the physician and/or other qualified health care professional(s) on the day of the encounter (includes time in activities that require the  physician or other qualified health care professional and does not include time in activities normally performed by clinical staff). Physician's time may include the following activities when performed: Preparing to see the patient (e.g., pre-charting review of records, searching for previously ordered imaging, lab work, and nerve conduction tests) Review of prior analgesic pharmacotherapies. Reviewing PMP Interpreting ordered tests (e.g., lab work, imaging, nerve conduction tests) Performing post-procedure evaluations, including interpretation of diagnostic procedures Obtaining and/or reviewing separately obtained history Performing a medically appropriate examination and/or evaluation Counseling and educating the patient/family/caregiver Ordering medications, tests, or procedures Referring and communicating with other health care professionals (when not separately reported) Documenting clinical information in the electronic or other health record Independently interpreting results (not separately reported) and communicating results to the patient/ family/caregiver Care coordination (not separately reported)  Note by: Gaspar Cola, MD Date: 02/18/2022; Time: 10:46 AM

## 2022-02-17 DIAGNOSIS — M23252 Derangement of posterior horn of lateral meniscus due to old tear or injury, left knee: Secondary | ICD-10-CM | POA: Insufficient documentation

## 2022-02-17 DIAGNOSIS — M75102 Unspecified rotator cuff tear or rupture of left shoulder, not specified as traumatic: Secondary | ICD-10-CM | POA: Insufficient documentation

## 2022-02-17 DIAGNOSIS — M47817 Spondylosis without myelopathy or radiculopathy, lumbosacral region: Secondary | ICD-10-CM | POA: Insufficient documentation

## 2022-02-17 DIAGNOSIS — M1712 Unilateral primary osteoarthritis, left knee: Secondary | ICD-10-CM | POA: Insufficient documentation

## 2022-02-17 DIAGNOSIS — M899 Disorder of bone, unspecified: Secondary | ICD-10-CM | POA: Insufficient documentation

## 2022-02-17 DIAGNOSIS — Z79899 Other long term (current) drug therapy: Secondary | ICD-10-CM | POA: Insufficient documentation

## 2022-02-17 DIAGNOSIS — M19012 Primary osteoarthritis, left shoulder: Secondary | ICD-10-CM | POA: Insufficient documentation

## 2022-02-17 DIAGNOSIS — M2242 Chondromalacia patellae, left knee: Secondary | ICD-10-CM | POA: Insufficient documentation

## 2022-02-17 DIAGNOSIS — Z789 Other specified health status: Secondary | ICD-10-CM | POA: Insufficient documentation

## 2022-02-17 DIAGNOSIS — S46812S Strain of other muscles, fascia and tendons at shoulder and upper arm level, left arm, sequela: Secondary | ICD-10-CM | POA: Insufficient documentation

## 2022-02-17 DIAGNOSIS — G894 Chronic pain syndrome: Secondary | ICD-10-CM | POA: Insufficient documentation

## 2022-02-17 DIAGNOSIS — M7522 Bicipital tendinitis, left shoulder: Secondary | ICD-10-CM | POA: Insufficient documentation

## 2022-02-17 DIAGNOSIS — M1711 Unilateral primary osteoarthritis, right knee: Secondary | ICD-10-CM | POA: Insufficient documentation

## 2022-02-18 ENCOUNTER — Ambulatory Visit (HOSPITAL_BASED_OUTPATIENT_CLINIC_OR_DEPARTMENT_OTHER): Payer: BC Managed Care – PPO | Admitting: Pain Medicine

## 2022-02-18 DIAGNOSIS — G894 Chronic pain syndrome: Secondary | ICD-10-CM

## 2022-02-18 DIAGNOSIS — Z91199 Patient's noncompliance with other medical treatment and regimen due to unspecified reason: Secondary | ICD-10-CM | POA: Insufficient documentation

## 2022-02-18 DIAGNOSIS — Z789 Other specified health status: Secondary | ICD-10-CM

## 2022-02-18 DIAGNOSIS — M899 Disorder of bone, unspecified: Secondary | ICD-10-CM

## 2022-02-18 DIAGNOSIS — M2242 Chondromalacia patellae, left knee: Secondary | ICD-10-CM

## 2022-02-18 DIAGNOSIS — Z79899 Other long term (current) drug therapy: Secondary | ICD-10-CM

## 2022-02-18 NOTE — Patient Instructions (Signed)
____________________________________________________________________________________________  New Patients  Welcome to [facility].   Initial Visit The first or initial visit consists of an evaluation only.   Interventional pain management.  We offer therapies other than opioid controlled substances to manage chronic pain. These include, but are not limited to, diagnostic, therapeutic, and palliative specialized injection therapies (i.e.: Epidural Steroids, Facet Blocks, etc.). We specialize in a variety of nerve blocks as well as radiofrequency treatments. We offer pain implant evaluations and trials, as well as follow up management. In addition we also provide a variety joint injections, including Viscosupplementation (AKA: Gel Therapy).  Prescription Pain Medication We provide evaluations for/of pharmacologic therapies. Recommendations will follow CDC Guidelines.  We no longer take patients for long-term medication management. We will not be taking over your pain medications.  ____________________________________________________________________________________________    ____________________________________________________________________________________________  Patient Information update  To: All of our patients.  Re: Name change.  It has been made official that our current name, "[place]"   will soon be changed to "[place]".   The purpose of this change is to eliminate any confusion created by the concept of our practice being a "Medication Management Pain Clinic". In the past this has led to the misconception that we treat pain primarily by the use of prescription medications.  Nothing can be farther from the truth.   Understanding PAIN MANAGEMENT: To further understand what our practice does, you  first have to understand that "Pain Management" is a subspecialty that requires additional training once a physician has completed their specialty training, which can be in either Anesthesia, Neurology, Psychiatry, or Physical Medicine and Rehabilitation (PMR). Each one of these contributes to the final approach taken by each physician to the management of their patient's pain. To be a "Pain Management Specialist" you must have first completed one of the specialty trainings below.  Anesthesiologists - trained in clinical pharmacology and interventional techniques such as nerve blockade and regional as well as central neuroanatomy. They are trained to block pain before, during, and after surgical interventions.  Neurologists - trained in the diagnosis and pharmacological treatment of complex neurological conditions, such as Multiple Sclerosis, Parkinson's, spinal cord injuries, and other systemic conditions that may be associated with symptoms that may include but are not limited to pain. They tend to rely primarily on the treatment of chronic pain using prescription medications.  Psychiatrist - trained in conditions affecting the psychosocial wellbeing of patients including but not limited to depression, anxiety, schizophrenia, personality disorders, addiction, and other substance use disorders that may be associated with chronic pain. They tend to rely primarily on the treatment of chronic pain using prescription medications.   Physical Medicine and Rehabilitation (PMR) physicians, also known as physiatrists - trained to treat a wide variety of medical conditions affecting the brain, spinal cord, nerves, bones, joints, ligaments, muscles, and tendons. Their training is primarily aimed at treating patients that have suffered injuries that have caused severe physical impairment. Their training is primarily aimed at the physical therapy and rehabilitation of those patients. They may also work alongside  orthopedic surgeons or neurosurgeons using their expertise in assisting surgical patients to recover after their surgeries.  INTERVENTIONAL PAIN MANAGEMENT is sub-subspecialty of Pain Management.  Our physicians are Board-certified in Anesthesia, Pain Management, and Interventional Pain Management.  This meaning that not only have they been trained and Board-certified in their specialty of Anesthesia, and  subspecialty of Pain Management, but they have also received further training in the sub-subspecialty of Interventional Pain Management, in order to become Board-certified as INTERVENTIONAL PAIN MANAGEMENT SPECIALIST.    Mission: Our goal is to use our skills in  [place] as alternatives to the chronic use of prescription opioid medications for the treatment of pain. To make this more clear, we have changed our name to reflect what we do and offer. We will continue to offer medication management assessment and recommendations, but we will not be taking over any patient's medication management.  ____________________________________________________________________________________________

## 2022-02-26 DIAGNOSIS — F411 Generalized anxiety disorder: Secondary | ICD-10-CM | POA: Diagnosis not present

## 2022-03-12 DIAGNOSIS — F411 Generalized anxiety disorder: Secondary | ICD-10-CM | POA: Diagnosis not present

## 2022-03-16 ENCOUNTER — Ambulatory Visit (HOSPITAL_BASED_OUTPATIENT_CLINIC_OR_DEPARTMENT_OTHER): Payer: BC Managed Care – PPO | Admitting: Pain Medicine

## 2022-03-16 DIAGNOSIS — Z91199 Patient's noncompliance with other medical treatment and regimen due to unspecified reason: Secondary | ICD-10-CM

## 2022-03-16 NOTE — Patient Instructions (Signed)
____________________________________________________________________________________________  New Patients  Welcome to [facility] [place].   Initial Visit The first or initial visit consists of an evaluation only.   Interventional pain management.  We offer therapies other than opioid controlled substances to manage chronic pain. These include, but are not limited to, diagnostic, therapeutic, and palliative specialized injection therapies (i.e.: Epidural Steroids, Facet Blocks, etc.). We specialize in a variety of nerve blocks as well as radiofrequency treatments. We offer pain implant evaluations and trials, as well as follow up management. In addition we also provide a variety joint injections, including Viscosupplementation (AKA: Gel Therapy).  Prescription Pain Medication. We specialize in alternatives to opioids. We can provide evaluations and recommendations for/of pharmacologic therapies based on CDC Guidelines.  We no longer take patients for long-term medication management. We will not be taking over your pain medications.  ____________________________________________________________________________________________    ____________________________________________________________________________________________  Patient Information update  To: All of our patients.  Re: Name change.  It has been made official that our current name, "[place]"   will soon be changed to "[place]".   The purpose of this change is to eliminate any confusion created by the concept of our practice being a "Medication Management Pain Clinic". In the past this has led to the misconception that we treat pain primarily by the use of prescription medications.  Nothing can be farther from the truth.   Understanding PAIN MANAGEMENT: To  further understand what our practice does, you first have to understand that "Pain Management" is a subspecialty that requires additional training once a physician has completed their specialty training, which can be in either Anesthesia, Neurology, Psychiatry, or Physical Medicine and Rehabilitation (PMR). Each one of these contributes to the final approach taken by each physician to the management of their patient's pain. To be a "Pain Management Specialist" you must have first completed one of the specialty trainings below.  Anesthesiologists - trained in clinical pharmacology and interventional techniques such as nerve blockade and regional as well as central neuroanatomy. They are trained to block pain before, during, and after surgical interventions.  Neurologists - trained in the diagnosis and pharmacological treatment of complex neurological conditions, such as Multiple Sclerosis, Parkinson's, spinal cord injuries, and other systemic conditions that may be associated with symptoms that may include but are not limited to pain. They tend to rely primarily on the treatment of chronic pain using prescription medications.  Psychiatrist - trained in conditions affecting the psychosocial wellbeing of patients including but not limited to depression, anxiety, schizophrenia, personality disorders, addiction, and other substance use disorders that may be associated with chronic pain. They tend to rely primarily on the treatment of chronic pain using prescription medications.   Physical Medicine and Rehabilitation (PMR) physicians, also known as physiatrists - trained to treat a wide variety of medical conditions affecting the brain, spinal cord, nerves, bones, joints, ligaments, muscles, and tendons. Their training is primarily aimed at treating patients that have suffered injuries that have caused severe physical impairment. Their training is primarily aimed at the physical therapy and rehabilitation of those  patients. They may also work alongside orthopedic surgeons or neurosurgeons using their expertise in assisting surgical patients to recover after their surgeries.  INTERVENTIONAL PAIN MANAGEMENT is sub-subspecialty of Pain Management.  Our physicians are Board-certified in Anesthesia, Pain Management, and Interventional Pain Management.  This meaning that not only have they been trained  and Board-certified in their specialty of Anesthesia, and subspecialty of Pain Management, but they have also received further training in the sub-subspecialty of Interventional Pain Management, in order to become Board-certified as INTERVENTIONAL PAIN MANAGEMENT SPECIALIST.    Mission: Our goal is to use our skills in  [place] as alternatives to the chronic use of prescription opioid medications for the treatment of pain. To make this more clear, we have changed our name to reflect what we do and offer. We will continue to offer medication management assessment and recommendations, but we will not be taking over any patient's medication management.  ____________________________________________________________________________________________

## 2022-03-16 NOTE — Progress Notes (Signed)
(  03/16/2022) 9:27 AM.  Patient is NO-SHOW to his second scheduled appointment for initial evaluation.  He was no-show to the 02/18/2022 appointment and today he is no-show to the 03/16/2022 9:00 AM appointment.  Patient also did not call to cancel or reschedule.  I will not be scheduling him with me again.

## 2022-03-26 ENCOUNTER — Ambulatory Visit: Payer: BC Managed Care – PPO | Admitting: Sports Medicine

## 2022-03-27 DIAGNOSIS — F411 Generalized anxiety disorder: Secondary | ICD-10-CM | POA: Diagnosis not present

## 2022-04-03 ENCOUNTER — Other Ambulatory Visit: Payer: Self-pay | Admitting: Pharmacist

## 2022-04-03 NOTE — Progress Notes (Signed)
Patient appearing on report for [employer] [facility] - Hypertension Control report due to last documented ambulatory blood pressure of 142/82 on 01/20/22. Next appointment with PCP is not scheduled   Outreached patient to discuss hypertension control and medication management. Left voicemail for patient to return my call at their convenience.   Larinda Buttery, PharmD Clinical Pharmacist [facility] [phone]

## 2022-04-10 DIAGNOSIS — F411 Generalized anxiety disorder: Secondary | ICD-10-CM | POA: Diagnosis not present

## 2022-04-24 DIAGNOSIS — F411 Generalized anxiety disorder: Secondary | ICD-10-CM | POA: Diagnosis not present

## 2022-04-29 ENCOUNTER — Ambulatory Visit: Payer: BC Managed Care – PPO | Admitting: Family Medicine

## 2022-04-29 ENCOUNTER — Encounter: Payer: Self-pay | Admitting: Family Medicine

## 2022-04-29 VITALS — BP 151/94 | HR 96 | Temp 98.1°F | Resp 20 | Ht 75.0 in | Wt 296.0 lb

## 2022-04-29 DIAGNOSIS — I10 Essential (primary) hypertension: Secondary | ICD-10-CM

## 2022-04-29 DIAGNOSIS — J0110 Acute frontal sinusitis, unspecified: Secondary | ICD-10-CM | POA: Insufficient documentation

## 2022-04-29 MED ORDER — AMOXICILLIN-POT CLAVULANATE 875-125 MG PO TABS: 1.0000 | ORAL_TABLET | Freq: Two times a day (BID) | ORAL | 0 refills | Status: DC

## 2022-04-29 NOTE — Patient Instructions (Signed)
You were prescribed an antibiotic today. Please complete the full course.  Acetaminophen or ibuprofen for fever according to package instructions, do not exceed recommended doses.  Adequate fluids to avoid dehydration. Lots of rest while you are recovering.  Follow-up if your symptoms do not improve.     Follow-up with PCP for elevated blood pressure.

## 2022-04-29 NOTE — Progress Notes (Signed)
Established Patient Office Visit  Subjective   Patient ID: Jerry Escobar, male    DOB: Jan 24, 1970  Age: 52 y.o. MRN: [id number]  Chief Complaint  Patient presents with   Sinus Problem    Patient states that he has a lot of sinus congestion he states that his symptoms started this past Sunday , He states that he has the same symptoms he was treated for 2 months ago    HPI Presents today for an acute visit with complaint of sinus congestion and pressure with runny nose.  Symptoms have been present since Sunday Associated symptoms include: pressure and congestion and sneezing  Pertinent negatives: no fever or chills Pain severity: pressure not pain Treatments tried include : flonase and alka seltzer sinus congestion, saline rinse Treatment effective : not resolving the issue Sick contacts : n/a   Elevated blood pressure on 3 medications. Encouraged to follow-up with PCP. Reports taking blood pressure medicines one hour ago. Has not seen PCP in a while, last labs done 08/05/21 with normal kidney function. Does not watch diet as well as he should.    Review of Systems  Constitutional:  Negative for chills and fever.  HENT:  Positive for congestion and sinus pain. Negative for ear pain and sore throat.   Eyes:  Negative for blurred vision and double vision.  Respiratory:  Negative for cough and shortness of breath.   Cardiovascular:  Negative for chest pain.  Neurological:  Negative for dizziness and headaches.      Objective:     BP (!) 151/94   Pulse 96   Temp 98.1 F (36.7 C) (Oral)   Resp 20   Ht 6\' 3"  (1.905 m)   Wt 296 lb (134.3 kg)   SpO2 96%   BMI 37.00 kg/m  BP Readings from Last 3 Encounters:  04/29/22 (!) 151/94  01/20/22 (!) 142/82  01/08/22 (!) 142/94      Physical Exam Vitals and nursing note reviewed.  Constitutional:      Appearance: Normal appearance. He is obese.  HENT:     Left Ear: Tympanic membrane normal.     Nose: Congestion present.      Right Turbinates: Swollen.     Left Turbinates: Swollen.     Right Sinus: Frontal sinus tenderness present. No maxillary sinus tenderness.     Left Sinus: Frontal sinus tenderness present. No maxillary sinus tenderness.     Mouth/Throat:     Mouth: Mucous membranes are moist.     Pharynx: Oropharynx is clear.  Eyes:     Extraocular Movements: Extraocular movements intact.  Cardiovascular:     Rate and Rhythm: Regular rhythm.     Heart sounds: Normal heart sounds.  Pulmonary:     Effort: Pulmonary effort is normal.     Breath sounds: Normal breath sounds.  Lymphadenopathy:     Cervical:     Right cervical: No superficial cervical adenopathy.    Left cervical: No superficial cervical adenopathy.  Skin:    General: Skin is warm and dry.     Capillary Refill: Capillary refill takes less than 2 seconds.  Neurological:     General: No focal deficit present.     Mental Status: He is alert. Mental status is at baseline.  Psychiatric:        Mood and Affect: Mood normal.        Behavior: Behavior normal.        Thought Content: Thought content normal.  Judgment: Judgment normal.     No results found for any visits on 04/29/22.    The 10-year ASCVD risk score (Arnett DK, et al., 2019) is: 12.7%    Assessment & Plan:   Problem List Items Addressed This Visit     Elevated blood pressure reading in office with diagnosis of hypertension  Works night shift, took BP mediations one hours prior to this visit likely not taken affect well.   Instructed patient on DASH diet and recommend that he follow-up with PCP as he is on 3 medications for hypertension. Denies chest pain, shortness of breath, headaches or vision changes. May benefit from cardiology or hypertension clinic  referral. Avoid decongestants due to blood pressure.    Acute non-recurrent frontal sinusitis - Primary  Nasal congestion with frontal sinus pain and pressure symptoms consistent with acute sinusitis. Augmentin  875-125 mg BID x 10 days. Sinus rinses. Supportive therapy as instructed.    Relevant Medications   amoxicillin-clavulanate (AUGMENTIN) 875-125 MG tablet  Agrees with plan of care discussed.  Questions answered.   Return if symptoms worsen or fail to improve.    Novella Olive, FNP

## 2022-05-04 DIAGNOSIS — Z630 Problems in relationship with spouse or partner: Secondary | ICD-10-CM | POA: Diagnosis not present

## 2022-05-04 DIAGNOSIS — R41841 Cognitive communication deficit: Secondary | ICD-10-CM | POA: Diagnosis not present

## 2022-05-04 DIAGNOSIS — Z719 Counseling, unspecified: Secondary | ICD-10-CM | POA: Diagnosis not present

## 2022-05-04 DIAGNOSIS — F4320 Adjustment disorder, unspecified: Secondary | ICD-10-CM | POA: Diagnosis not present

## 2022-05-07 DIAGNOSIS — F411 Generalized anxiety disorder: Secondary | ICD-10-CM | POA: Diagnosis not present

## 2022-05-20 DIAGNOSIS — J310 Chronic rhinitis: Secondary | ICD-10-CM | POA: Diagnosis not present

## 2022-05-20 DIAGNOSIS — J328 Other chronic sinusitis: Secondary | ICD-10-CM | POA: Diagnosis not present

## 2022-05-21 DIAGNOSIS — F411 Generalized anxiety disorder: Secondary | ICD-10-CM | POA: Diagnosis not present

## 2022-05-25 DIAGNOSIS — Z630 Problems in relationship with spouse or partner: Secondary | ICD-10-CM | POA: Diagnosis not present

## 2022-05-25 DIAGNOSIS — R41841 Cognitive communication deficit: Secondary | ICD-10-CM | POA: Diagnosis not present

## 2022-05-25 DIAGNOSIS — F4320 Adjustment disorder, unspecified: Secondary | ICD-10-CM | POA: Diagnosis not present

## 2022-05-28 DIAGNOSIS — J3081 Allergic rhinitis due to animal (cat) (dog) hair and dander: Secondary | ICD-10-CM | POA: Diagnosis not present

## 2022-05-28 DIAGNOSIS — J328 Other chronic sinusitis: Secondary | ICD-10-CM | POA: Diagnosis not present

## 2022-05-28 DIAGNOSIS — J301 Allergic rhinitis due to pollen: Secondary | ICD-10-CM | POA: Diagnosis not present

## 2022-06-01 DIAGNOSIS — Z630 Problems in relationship with spouse or partner: Secondary | ICD-10-CM | POA: Diagnosis not present

## 2022-06-01 DIAGNOSIS — F4320 Adjustment disorder, unspecified: Secondary | ICD-10-CM | POA: Diagnosis not present

## 2022-06-01 DIAGNOSIS — R41841 Cognitive communication deficit: Secondary | ICD-10-CM | POA: Diagnosis not present

## 2022-06-04 DIAGNOSIS — F411 Generalized anxiety disorder: Secondary | ICD-10-CM | POA: Diagnosis not present

## 2022-06-15 DIAGNOSIS — Z630 Problems in relationship with spouse or partner: Secondary | ICD-10-CM | POA: Diagnosis not present

## 2022-06-15 DIAGNOSIS — F4320 Adjustment disorder, unspecified: Secondary | ICD-10-CM | POA: Diagnosis not present

## 2022-06-17 DIAGNOSIS — F411 Generalized anxiety disorder: Secondary | ICD-10-CM | POA: Diagnosis not present

## 2022-06-18 DIAGNOSIS — J301 Allergic rhinitis due to pollen: Secondary | ICD-10-CM | POA: Diagnosis not present

## 2022-06-18 DIAGNOSIS — J3081 Allergic rhinitis due to animal (cat) (dog) hair and dander: Secondary | ICD-10-CM | POA: Diagnosis not present

## 2022-06-23 ENCOUNTER — Ambulatory Visit: Payer: BC Managed Care – PPO | Admitting: Sports Medicine

## 2022-06-25 ENCOUNTER — Ambulatory Visit: Payer: BC Managed Care – PPO | Admitting: Sports Medicine

## 2022-06-25 VITALS — BP 148/91 | HR 104

## 2022-06-25 DIAGNOSIS — R351 Nocturia: Secondary | ICD-10-CM | POA: Diagnosis not present

## 2022-06-25 DIAGNOSIS — Z23 Encounter for immunization: Secondary | ICD-10-CM | POA: Diagnosis not present

## 2022-06-25 DIAGNOSIS — Z0000 Encounter for general adult medical examination without abnormal findings: Secondary | ICD-10-CM | POA: Diagnosis not present

## 2022-06-25 MED ORDER — TAMSULOSIN HCL 0.4 MG PO CAPS: ORAL_CAPSULE | ORAL | 3 refills | Status: DC

## 2022-06-25 NOTE — Assessment & Plan Note (Signed)
This is a very pleasant 52 year old male, for the past several months has noted increasing episodes of nocturia, he has to wake up 4+ times per night. He does work the night shift, he will leave for work at about 5 PM, gets home closer to 7 AM. He does not drink any caffeine after the beginning of his shift, he drinks maybe a cup of water in the evening and goes to sleep. He does also experience occasional episodes of urinary urgency without leakage. He also endorses hesitancy, weak stream, dribbling. This is likely obstructive uropathy due to BPH. Overactive bladder is also a possibility here. We discussed the anatomy and pathophysiology, he will avoid liquids consumption within 3 hours of bedtime, void before bedtime and we will add Flomax. Will do this for about 6 weeks and if insufficient improvement we will consider either doubling Flomax if he has had some improvement or adding an OAB medicine such as oxybutynin, Vesicare, or Myrbetriq.

## 2022-06-25 NOTE — Progress Notes (Signed)
    Procedures performed today:    None.  Independent interpretation of notes and tests performed by another provider:   None.  Brief History, Exam, Impression, and Recommendations:    Annual physical exam Due for Tdap, will do this today.  Nocturia This is a very pleasant 52 year old male, for the past several months has noted increasing episodes of nocturia, he has to wake up 4+ times per night. He does work the night shift, he will leave for work at about 5 PM, gets home closer to 7 AM. He does not drink any caffeine after the beginning of his shift, he drinks maybe a cup of water in the evening and goes to sleep. He does also experience occasional episodes of urinary urgency without leakage. He also endorses hesitancy, weak stream, dribbling. This is likely obstructive uropathy due to BPH. Overactive bladder is also a possibility here. We discussed the anatomy and pathophysiology, he will avoid liquids consumption within 3 hours of bedtime, void before bedtime and we will add Flomax. Will do this for about 6 weeks and if insufficient improvement we will consider either doubling Flomax if he has had some improvement or adding an OAB medicine such as oxybutynin, Vesicare, or Myrbetriq.  Chronic process with exacerbation and pharmacologic intervention  ____________________________________________ Ihor Austin. Benjamin Stain, M.D., ABFM., CAQSM., AME. Primary Care and Sports Medicine [facility]  Adjunct Professor of Family Medicine  [employer] [facility] [employer]  Restaurant manager, fast food

## 2022-06-25 NOTE — Addendum Note (Signed)
Addended by: Carren Rang A on: 06/25/2022 11:29 AM   Modules accepted: Orders

## 2022-06-25 NOTE — Assessment & Plan Note (Signed)
Due for Tdap, will do this today.

## 2022-06-26 DIAGNOSIS — F4320 Adjustment disorder, unspecified: Secondary | ICD-10-CM | POA: Diagnosis not present

## 2022-06-26 DIAGNOSIS — Z630 Problems in relationship with spouse or partner: Secondary | ICD-10-CM | POA: Diagnosis not present

## 2022-07-08 DIAGNOSIS — F411 Generalized anxiety disorder: Secondary | ICD-10-CM | POA: Diagnosis not present

## 2022-07-13 DIAGNOSIS — J3089 Other allergic rhinitis: Secondary | ICD-10-CM | POA: Diagnosis not present

## 2022-07-13 DIAGNOSIS — J3081 Allergic rhinitis due to animal (cat) (dog) hair and dander: Secondary | ICD-10-CM | POA: Diagnosis not present

## 2022-07-22 DIAGNOSIS — F411 Generalized anxiety disorder: Secondary | ICD-10-CM | POA: Diagnosis not present

## 2022-07-25 ENCOUNTER — Other Ambulatory Visit: Payer: Self-pay | Admitting: Family Medicine

## 2022-07-25 DIAGNOSIS — R0981 Nasal congestion: Secondary | ICD-10-CM

## 2022-07-25 DIAGNOSIS — J0100 Acute maxillary sinusitis, unspecified: Secondary | ICD-10-CM

## 2022-08-06 ENCOUNTER — Ambulatory Visit: Payer: BC Managed Care – PPO | Admitting: Sports Medicine

## 2022-08-06 ENCOUNTER — Encounter: Payer: Self-pay | Admitting: Sports Medicine

## 2022-08-06 VITALS — BP 151/87 | HR 87

## 2022-08-06 DIAGNOSIS — R351 Nocturia: Secondary | ICD-10-CM | POA: Diagnosis not present

## 2022-08-06 DIAGNOSIS — G4726 Circadian rhythm sleep disorder, shift work type: Secondary | ICD-10-CM | POA: Diagnosis not present

## 2022-08-06 DIAGNOSIS — F411 Generalized anxiety disorder: Secondary | ICD-10-CM | POA: Diagnosis not present

## 2022-08-06 MED ORDER — MELATONIN 5 MG PO TABS
ORAL_TABLET | ORAL | Status: AC
Start: 2022-08-06 — End: ?

## 2022-08-06 MED ORDER — ZOLPIDEM TARTRATE 5 MG PO TABS
ORAL_TABLET | ORAL | 1 refills | Status: DC
Start: 2022-08-06 — End: 2022-08-20

## 2022-08-06 MED ORDER — IBUPROFEN 800 MG PO TABS: 800.0000 mg | ORAL_TABLET | Freq: Three times a day (TID) | ORAL | 2 refills | Status: DC | PRN

## 2022-08-06 NOTE — Progress Notes (Signed)
    Procedures performed today:    None.  Independent interpretation of notes and tests performed by another provider:   None.  Brief History, Exam, Impression, and Recommendations:    Nocturia Good sleep hygiene now, nocturia has improved with the addition of Flomax. Return as needed for this.  Shift work sleep disorder Jerry Escobar does have a lot of difficulty sleeping, he works third shift, he gets home at approximately 6:00 in the morning, he would like to be sleeping by about 8:00, he has tried melatonin without improvement, he has tried darkening the room without sufficient improvement. We will add low-dose Ambien, as it is impossible to fully darken his room with the shape of his windows I would also like him to do a low-dose of melatonin when he takes the Ambien. Will do this for 2 weeks with a follow-up virtual visit for dose titration.  I spent 30 minutes of total time managing this patient today, this includes chart review, face to face, and non-face to face time.  ____________________________________________ Ihor Austin. Benjamin Stain, M.D., ABFM., CAQSM., AME. Primary Care and Sports Medicine [facility] [facility]  Adjunct Professor of Family Medicine  [place] [employer] [facility] [employer]  Restaurant manager, fast food

## 2022-08-06 NOTE — Assessment & Plan Note (Signed)
Good sleep hygiene now, nocturia has improved with the addition of Flomax. Return as needed for this.

## 2022-08-06 NOTE — Assessment & Plan Note (Signed)
Harlow does have a lot of difficulty sleeping, he works third shift, he gets home at approximately 6:00 in the morning, he would like to be sleeping by about 8:00, he has tried melatonin without improvement, he has tried darkening the room without sufficient improvement. We will add low-dose Ambien, as it is impossible to fully darken his room with the shape of his windows I would also like him to do a low-dose of melatonin when he takes the Ambien. Will do this for 2 weeks with a follow-up virtual visit for dose titration.

## 2022-08-20 ENCOUNTER — Encounter: Payer: Self-pay | Admitting: Sports Medicine

## 2022-08-20 ENCOUNTER — Telehealth (INDEPENDENT_AMBULATORY_CARE_PROVIDER_SITE_OTHER): Payer: BC Managed Care – PPO | Admitting: Sports Medicine

## 2022-08-20 DIAGNOSIS — F411 Generalized anxiety disorder: Secondary | ICD-10-CM | POA: Diagnosis not present

## 2022-08-20 DIAGNOSIS — G4726 Circadian rhythm sleep disorder, shift work type: Secondary | ICD-10-CM | POA: Diagnosis not present

## 2022-08-20 MED ORDER — ZOLPIDEM TARTRATE 10 MG PO TABS
ORAL_TABLET | ORAL | 5 refills | Status: DC
Start: 2022-08-20 — End: 2023-03-09

## 2022-08-20 NOTE — Progress Notes (Signed)
   Virtual Visit via WebEx/MyChart   I connected with  Jerry Escobar  on 08/20/22 via WebEx/MyChart/Doximity Video and verified that I am speaking with the correct person using two identifiers.   I discussed the limitations, risks, security and privacy concerns of performing an evaluation and management service by WebEx/MyChart/Doximity Video, including the higher likelihood of inaccurate diagnosis and treatment, and the availability of in person appointments.  We also discussed the likely need of an additional face to face encounter for complete and high quality delivery of care.  I also discussed with the patient that there may be a patient responsible charge related to this service. The patient expressed understanding and wishes to proceed.  Provider location is in medical facility. Patient location is at their home, different from provider location. People involved in care of the patient during this telehealth encounter were myself, my nurse/medical assistant, and my front office/scheduling team member.  Review of Systems: No fevers, chills, night sweats, weight loss, chest pain, or shortness of breath.   Objective Findings:    General: Speaking full sentences, no audible heavy breathing.  Sounds alert and appropriately interactive.  Appears well.  Face symmetric.  Extraocular movements intact.  Pupils equal and round.  No nasal flaring or accessory muscle use visualized.  Independent interpretation of tests performed by another provider:   None.  Brief History, Exam, Impression, and Recommendations:    Shift work sleep disorder This is a very pleasant 52 year old male, he does have a lot of difficulty sleeping, he works third shift, he gets home at approximately 6:00 in the morning, he would like to be sleeping by about 8:00 in the morning, has tried melatonin without improvement, he he cannot fully darken his room. We added low-dose Ambien, he did well, he tried to and this worked  well, he was able to get 8 hours of sleep, he woke feeling well rested. We will refill his Ambien with 10 mg, 30 pills, he will let me know in a month how things are going. We certainly still have the option to increase to 12.5 mg continuous release.   I discussed the above assessment and treatment plan with the patient. The patient was provided an opportunity to ask questions and all were answered. The patient agreed with the plan and demonstrated an understanding of the instructions.   The patient was advised to call back or seek an in-person evaluation if the symptoms worsen or if the condition fails to improve as anticipated.   I provided 30 minutes of face to face and non-face-to-face time during this encounter date, time was needed to gather information, review chart, records, communicate/coordinate with staff remotely, as well as complete documentation.   ____________________________________________ Ihor Austin. Benjamin Stain, M.D., ABFM., CAQSM., AME. Primary Care and Sports Medicine [place] [facility]  Adjunct Professor of Family Medicine  [place] [employer] [facility] [employer]  Restaurant manager, fast food

## 2022-08-20 NOTE — Assessment & Plan Note (Signed)
This is a very pleasant 52 year old male, he does have a lot of difficulty sleeping, he works third shift, he gets home at approximately 6:00 in the morning, he would like to be sleeping by about 8:00 in the morning, has tried melatonin without improvement, he he cannot fully darken his room. We added low-dose Ambien, he did well, he tried to and this worked well, he was able to get 8 hours of sleep, he woke feeling well rested. We will refill his Ambien with 10 mg, 30 pills, he will let me know in a month how things are going. We certainly still have the option to increase to 12.5 mg continuous release.

## 2022-09-03 DIAGNOSIS — F411 Generalized anxiety disorder: Secondary | ICD-10-CM | POA: Diagnosis not present

## 2022-09-16 DIAGNOSIS — F411 Generalized anxiety disorder: Secondary | ICD-10-CM | POA: Diagnosis not present

## 2022-09-20 ENCOUNTER — Other Ambulatory Visit: Payer: Self-pay | Admitting: Sports Medicine

## 2022-09-20 DIAGNOSIS — R351 Nocturia: Secondary | ICD-10-CM

## 2022-10-01 DIAGNOSIS — F411 Generalized anxiety disorder: Secondary | ICD-10-CM | POA: Diagnosis not present

## 2022-10-05 ENCOUNTER — Other Ambulatory Visit: Payer: Self-pay

## 2022-10-05 NOTE — Progress Notes (Signed)
Pharmacy Quality Measure Review  This patient is appearing on a report for being at risk of failing the Controlling Blood Pressure measure this calendar year.   -Last documented BP 151/87 on 08/06/22 -Contacted patient to assess adherence to BP medications  -Patient is adherent to current regimen -Has validated, automatic, upper-arm BP monitor but does not check home BG regularly -Follow-up telephone visit scheduled in 1 week to obtain BP readings from the next few days  Lenna Gilford, PharmD, DPLA

## 2022-10-12 ENCOUNTER — Other Ambulatory Visit: Payer: BC Managed Care – PPO

## 2022-10-12 NOTE — Progress Notes (Signed)
10/12/2022  Patient ID: Jerry Escobar, male   DOB: 1970-04-16, 52 y.o.   MRN: [id number]  Outreach attempt to follow-up on patient's BP unsuccessful.  HIPAA compliant voicemail was left with my direct phone number, so patient can return my call at his convenience.  If I do not hear back, I will try to call later this week  Lenna Gilford, PharmD, DPLA

## 2022-10-14 ENCOUNTER — Telehealth: Payer: Self-pay

## 2022-10-14 NOTE — Progress Notes (Signed)
10/14/2022  Patient ID: Jerry Escobar, male   DOB: May 26, 1970, 52 y.o.   MRN: [id number]  Patient returned missed call from me yesterday, but I was not able to answer when he called.  Attempted to connect this morning but was unable to reach to follow-up on BP control.  Left HIPAA compliant voicemail with my direct phone number.  Lenna Gilford, PharmD, DPLA

## 2022-10-15 ENCOUNTER — Telehealth: Payer: Self-pay

## 2022-10-15 DIAGNOSIS — F411 Generalized anxiety disorder: Secondary | ICD-10-CM | POA: Diagnosis not present

## 2022-10-15 NOTE — Progress Notes (Signed)
Pharmacy Quality Measure Review  This patient is appearing on a report for being at risk of failing the Controlling Blood Pressure measure this calendar year.   Last documented BP 151/87 on 08/06/22 -Patient has been monitoring his home BP over the past week since we last spoke, and his BP is consistently averaging 142/82 -Has validated, upper-arm, BP monitor -Patient takes his BP medications when he wakes up prior to going to work (works overnight), which is appropriate -I recommend BP follow-up with PCP, as patient likely needs additional therapy  -Valsartan and amlodipine are at MDD, but additional agent such as spironolactone 25mg  daily could be considered based on patient's most recent K and Scr levels.  Lenna Gilford, PharmD, DPLA

## 2022-10-23 ENCOUNTER — Other Ambulatory Visit: Payer: Self-pay | Admitting: Sports Medicine

## 2022-10-27 ENCOUNTER — Encounter: Payer: Self-pay | Admitting: Sports Medicine

## 2022-10-27 ENCOUNTER — Ambulatory Visit: Payer: BC Managed Care – PPO | Admitting: Sports Medicine

## 2022-10-27 VITALS — BP 144/84 | HR 92 | Ht 75.0 in | Wt 296.0 lb

## 2022-10-27 DIAGNOSIS — E66812 Obesity, class 2: Secondary | ICD-10-CM

## 2022-10-27 DIAGNOSIS — I10 Essential (primary) hypertension: Secondary | ICD-10-CM | POA: Diagnosis not present

## 2022-10-27 DIAGNOSIS — G4733 Obstructive sleep apnea (adult) (pediatric): Secondary | ICD-10-CM | POA: Diagnosis not present

## 2022-10-27 DIAGNOSIS — R7303 Prediabetes: Secondary | ICD-10-CM

## 2022-10-27 MED ORDER — METOPROLOL SUCCINATE ER 25 MG PO TB24
25.0000 mg | ORAL_TABLET | Freq: Every day | ORAL | 3 refills | Status: DC
Start: 2022-10-27 — End: 2023-01-08

## 2022-10-27 NOTE — Assessment & Plan Note (Signed)
Jerry Escobar returns, continues to have difficult to control blood pressure, he is on amlodipine 10, max dose valsartan/hydrochlorothiazide. On further questioning he does have some sleep apnea symptoms, I did see him with his wife today. We will pull the trigger for sleep study, I will also add Toprol-XL.

## 2022-10-27 NOTE — Progress Notes (Addendum)
    Procedures performed today:    None.  Independent interpretation of notes and tests performed by another provider:   None.  Brief History, Exam, Impression, and Recommendations:    Benign essential hypertension Jerry Escobar returns, continues to have difficult to control blood pressure, he is on amlodipine 10, max dose valsartan/hydrochlorothiazide. On further questioning he does have some sleep apnea symptoms, I did see him with his wife today. We will pull the trigger for sleep study, I will also add Toprol-XL.  Obstructive sleep apnea Years ago he had a sleep study done, he did test positive with an AHI of 11.9. Never followed through with CPAP, ordering a new sleep study. He does have difficulty control high blood pressure, excessive tiredness through the day (works night shift). He is agreeable to try CPAP now.  Obesity Referral placed to healthy weight and wellness.    ____________________________________________ Ihor Austin. Benjamin Stain, M.D., ABFM., CAQSM., AME. Primary Care and Sports Medicine [place]  Adjunct Professor of Family Medicine  [place] [employer]  Restaurant manager, fast food

## 2022-10-27 NOTE — Assessment & Plan Note (Signed)
Years ago he had a sleep study done, he did test positive with an AHI of 11.9. Never followed through with CPAP, ordering a new sleep study. He does have difficulty control high blood pressure, excessive tiredness through the day (works night shift). He is agreeable to try CPAP now.

## 2022-10-29 DIAGNOSIS — F411 Generalized anxiety disorder: Secondary | ICD-10-CM | POA: Diagnosis not present

## 2022-11-09 ENCOUNTER — Other Ambulatory Visit: Payer: Self-pay | Admitting: Sports Medicine

## 2022-11-09 DIAGNOSIS — I10 Essential (primary) hypertension: Secondary | ICD-10-CM

## 2022-11-13 DIAGNOSIS — F411 Generalized anxiety disorder: Secondary | ICD-10-CM | POA: Diagnosis not present

## 2022-11-23 DIAGNOSIS — R7303 Prediabetes: Secondary | ICD-10-CM | POA: Diagnosis not present

## 2022-11-24 ENCOUNTER — Ambulatory Visit: Payer: BC Managed Care – PPO | Admitting: Sports Medicine

## 2022-11-24 LAB — COMPREHENSIVE METABOLIC PANEL
ALT: 44 [IU]/L (ref 0–44)
AST: 37 [IU]/L (ref 0–40)
Albumin: 4.6 g/dL (ref 3.8–4.9)
Alkaline Phosphatase: 67 [IU]/L (ref 44–121)
BUN/Creatinine Ratio: 15 (ref 9–20)
BUN: 17 mg/dL (ref 6–24)
Bilirubin Total: 0.4 mg/dL (ref 0.0–1.2)
CO2: 23 mmol/L (ref 20–29)
Calcium: 9 mg/dL (ref 8.7–10.2)
Chloride: 103 mmol/L (ref 96–106)
Creatinine, Ser: 1.16 mg/dL (ref 0.76–1.27)
Globulin, Total: 2.5 g/dL (ref 1.5–4.5)
Glucose: 85 mg/dL (ref 70–99)
Potassium: 4.1 mmol/L (ref 3.5–5.2)
Sodium: 142 mmol/L (ref 134–144)
Total Protein: 7.1 g/dL (ref 6.0–8.5)
eGFR: 76 mL/min/{1.73_m2} (ref >59)

## 2022-11-24 LAB — TSH: TSH: 0.962 u[IU]/mL (ref 0.450–4.500)

## 2022-11-24 LAB — LIPID PANEL
Chol/HDL Ratio: 5 {ratio} (ref 0.0–5.0)
Cholesterol, Total: 215 mg/dL — ABNORMAL HIGH (ref 100–199)
HDL: 43 mg/dL (ref >39)
LDL Chol Calc (NIH): 146 mg/dL — ABNORMAL HIGH (ref 0–99)
Triglycerides: 144 mg/dL (ref 0–149)
VLDL Cholesterol Cal: 26 mg/dL (ref 5–40)

## 2022-11-24 LAB — CBC
Hematocrit: 43.9 % (ref 37.5–51.0)
Hemoglobin: 14.2 g/dL (ref 13.0–17.7)
MCH: 27.4 pg (ref 26.6–33.0)
MCHC: 32.3 g/dL (ref 31.5–35.7)
MCV: 85 fL (ref 79–97)
Platelets: 192 10*3/uL (ref 150–450)
RBC: 5.19 x10E6/uL (ref 4.14–5.80)
RDW: 13.4 % (ref 11.6–15.4)
WBC: 5 10*3/uL (ref 3.4–10.8)

## 2022-11-24 LAB — HEMOGLOBIN A1C
Est. average glucose Bld gHb Est-mCnc: 126 mg/dL
Hgb A1c MFr Bld: 6 % — ABNORMAL HIGH (ref 4.8–5.6)

## 2022-11-24 NOTE — Assessment & Plan Note (Signed)
Referral placed to healthy weight and wellness

## 2022-11-24 NOTE — Addendum Note (Signed)
Addended by: Monica Becton on: 11/24/2022 08:47 PM   Modules accepted: Orders

## 2022-11-26 DIAGNOSIS — F411 Generalized anxiety disorder: Secondary | ICD-10-CM | POA: Diagnosis not present

## 2022-12-01 ENCOUNTER — Ambulatory Visit: Payer: BC Managed Care – PPO | Admitting: Sports Medicine

## 2022-12-01 ENCOUNTER — Encounter: Payer: Self-pay | Admitting: Sports Medicine

## 2022-12-01 ENCOUNTER — Other Ambulatory Visit (INDEPENDENT_AMBULATORY_CARE_PROVIDER_SITE_OTHER): Payer: BC Managed Care – PPO

## 2022-12-01 VITALS — BP 145/81 | HR 88 | Ht 75.0 in

## 2022-12-01 DIAGNOSIS — I10 Essential (primary) hypertension: Secondary | ICD-10-CM

## 2022-12-01 DIAGNOSIS — M23307 Other meniscus derangements, unspecified meniscus, left knee: Secondary | ICD-10-CM

## 2022-12-01 DIAGNOSIS — M170 Bilateral primary osteoarthritis of knee: Secondary | ICD-10-CM | POA: Diagnosis not present

## 2022-12-01 MED ORDER — TRIAMCINOLONE ACETONIDE 40 MG/ML IJ SUSP
40.0000 mg | Freq: Once | INTRAMUSCULAR | Status: AC
Start: 2022-12-01 — End: 2022-12-01
  Administered 2022-12-01: 40 mg via INTRAMUSCULAR

## 2022-12-01 NOTE — Progress Notes (Signed)
    Procedures performed today:    Procedure: Real-time Ultrasound Guided aspiration/injection of right knee Device: Samsung HS60  Verbal informed consent obtained.  Time-out conducted.  Noted no overlying erythema, induration, or other signs of local infection.  Skin prepped in a sterile fashion.  Local anesthesia: Topical Ethyl chloride.  With sterile technique and under real time ultrasound guidance: Noted effusion, aspirated 20 mL of clear, straw-colored fluid, syringe switched and 1 cc Kenalog 40, 2 cc lidocaine, 2 cc bupivacaine injected easily Completed without difficulty  Advised to call if fevers/chills, erythema, induration, drainage, or persistent bleeding.  Images permanently stored and available for review in PACS.  Impression: Technically successful ultrasound guided aspiration/injection.  Independent interpretation of notes and tests performed by another provider:   None.  Brief History, Exam, Impression, and Recommendations:    Primary osteoarthritis of both knees Known knee osteoarthritis, recurrence of swelling and pain right knee, aspiration, injection, return to see me in 4 to 6 weeks for Home conditioning given.  Benign essential hypertension Overall doing much better on Toprol XL, valsartan/hydrochlorothiazide max dose and amlodipine 10 mg daily. Has not yet started CPAP.    ____________________________________________ Ihor Austin. Benjamin Stain, M.D., ABFM., CAQSM., AME. Primary Care and Sports Medicine [facility]  Adjunct Professor of Family Medicine  [place] [employer] [facility] [employer]  Restaurant manager, fast food

## 2022-12-01 NOTE — Assessment & Plan Note (Signed)
Overall doing much better on Toprol XL, valsartan/hydrochlorothiazide max dose and amlodipine 10 mg daily. Has not yet started CPAP.

## 2022-12-01 NOTE — Addendum Note (Signed)
Addended by: Carren Rang A on: 12/01/2022 04:21 PM   Modules accepted: Orders

## 2022-12-01 NOTE — Assessment & Plan Note (Signed)
Known knee osteoarthritis, recurrence of swelling and pain right knee, aspiration, injection, return to see me in 4 to 6 weeks for Home conditioning given.

## 2022-12-10 DIAGNOSIS — F411 Generalized anxiety disorder: Secondary | ICD-10-CM | POA: Diagnosis not present

## 2022-12-11 ENCOUNTER — Ambulatory Visit (HOSPITAL_BASED_OUTPATIENT_CLINIC_OR_DEPARTMENT_OTHER): Payer: BC Managed Care – PPO | Attending: Sports Medicine | Admitting: Internal Medicine

## 2022-12-11 DIAGNOSIS — I10 Essential (primary) hypertension: Secondary | ICD-10-CM

## 2022-12-14 DIAGNOSIS — Z0289 Encounter for other administrative examinations: Secondary | ICD-10-CM

## 2022-12-15 ENCOUNTER — Encounter: Payer: Self-pay | Admitting: Family Medicine

## 2022-12-15 ENCOUNTER — Ambulatory Visit (INDEPENDENT_AMBULATORY_CARE_PROVIDER_SITE_OTHER): Payer: BC Managed Care – PPO | Admitting: Family Medicine

## 2022-12-15 VITALS — BP 136/73 | HR 87 | Ht 75.0 in | Wt 286.0 lb

## 2022-12-15 DIAGNOSIS — E66812 Obesity, class 2: Secondary | ICD-10-CM | POA: Diagnosis not present

## 2022-12-15 DIAGNOSIS — I10 Essential (primary) hypertension: Secondary | ICD-10-CM | POA: Diagnosis not present

## 2022-12-15 DIAGNOSIS — R7303 Prediabetes: Secondary | ICD-10-CM | POA: Diagnosis not present

## 2022-12-15 DIAGNOSIS — M170 Bilateral primary osteoarthritis of knee: Secondary | ICD-10-CM

## 2022-12-15 DIAGNOSIS — Z6835 Body mass index (BMI) 35.0-35.9, adult: Secondary | ICD-10-CM

## 2022-12-15 NOTE — Assessment & Plan Note (Signed)
Lab Results  Component Value Date   HGBA1C 6.0 (H) 11/23/2022   He has + fam hx of type II diabetes and obesity He is not taking metformin He is less physically active than he used to be  Will look for A1c improvements with changes to diet, exercise and weight reduction

## 2022-12-15 NOTE — Progress Notes (Signed)
Office: [phone]  /  Fax: [phone]   Initial Visit  Jerry Escobar was seen in clinic today to evaluate for obesity. He is interested in losing weight to improve overall health and reduce the risk of weight related complications. He presents today to review program treatment options, initial physical assessment, and evaluation.     He was referred by: PCP  When asked what else they would like to accomplish? He states: Improve existing medical conditions, Reduce number of medications, Improve quality of life, and Improve appearance He would like to be ~265 lb.  He was last at this weight in his 28s when he was more active  Weight history:  came down from 300 lb for the past few years.    When asked how has your weight affected you? He states: Contributed to medical problems, Contributed to orthopedic problems or mobility issues, and Having fatigue  Some associated conditions: Hypertension, Arthritis:R knee and L hip , Hyperlipidemia, Fatty liver disease, and Prediabetes  Contributing factors: Family history of obesity, Disruption of circadian rhythm / sleep disordered breathing, Consumption of processed foods, Moderate to high levels of stress, and Reduced physical activity  Weight promoting medications identified: None  Current nutrition plan: None  Current level of physical activity: Limited due to chronic pain or orthopedic problems  Current or previous pharmacotherapy: None  Response to medication: Never tried medications   Past medical history includes:   Past Medical History:  Diagnosis Date   Arthritis    Hyperlipidemia    Hypertension    Seasonal allergies    Transaminitis      Objective:   BP 136/73   Pulse 87   Ht 6\' 3"  (1.905 m)   Wt 286 lb (129.7 kg)   SpO2 98%   BMI 35.75 kg/m  He was weighed on the bioimpedance scale: Body mass index is 35.75 kg/m.  Peak Weight:300 , Body Fat%:34.1, Visceral Fat Rating:19, Weight trend over the last 12 months:  Unchanged  General:  Alert, oriented and cooperative. Patient is in no acute distress.  Respiratory: Normal respiratory effort, no problems with respiration noted   Gait: able to ambulate independently  Mental Status: Normal mood and affect. Normal behavior. Normal judgment and thought content.   DIAGNOSTIC DATA REVIEWED:  BMET    Component Value Date/Time   NA 142 11/23/2022 1507   K 4.1 11/23/2022 1507   CL 103 11/23/2022 1507   CO2 23 11/23/2022 1507   GLUCOSE 85 11/23/2022 1507   GLUCOSE 87 08/05/2021 1132   BUN 17 11/23/2022 1507   CREATININE 1.16 11/23/2022 1507   CREATININE 0.98 08/05/2021 1132   CALCIUM 9.0 11/23/2022 1507   GFRNONAA 69 05/17/2019 1018   GFRAA 80 05/17/2019 1018   Lab Results  Component Value Date   HGBA1C 6.0 (H) 11/23/2022   HGBA1C 6.0 (H) 01/15/2014   No results found for: "INSULIN" CBC    Component Value Date/Time   WBC 5.0 11/23/2022 1507   WBC 4.0 08/05/2021 1132   RBC 5.19 11/23/2022 1507   RBC 5.27 08/05/2021 1132   HGB 14.2 11/23/2022 1507   HCT 43.9 11/23/2022 1507   PLT 192 11/23/2022 1507   MCV 85 11/23/2022 1507   MCH 27.4 11/23/2022 1507   MCH 27.1 08/05/2021 1132   MCHC 32.3 11/23/2022 1507   MCHC 32.8 08/05/2021 1132   RDW 13.4 11/23/2022 1507   Iron/TIBC/Ferritin/ %Sat    Component Value Date/Time   IRON 93 02/20/2014 1431   TIBC  347 02/20/2014 1431   FERRITIN 222 02/20/2014 1254   IRONPCTSAT 27 02/20/2014 1431   Lipid Panel     Component Value Date/Time   CHOL 215 (H) 11/23/2022 1507   TRIG 144 11/23/2022 1507   HDL 43 11/23/2022 1507   CHOLHDL 5.0 11/23/2022 1507   CHOLHDL 4.4 02/21/2021 0000   VLDL 31 (H) 04/09/2015 0931   LDLCALC 146 (H) 11/23/2022 1507   LDLCALC 121 (H) 02/21/2021 0000   Hepatic Function Panel     Component Value Date/Time   PROT 7.1 11/23/2022 1507   ALBUMIN 4.6 11/23/2022 1507   AST 37 11/23/2022 1507   ALT 44 11/23/2022 1507   ALKPHOS 67 11/23/2022 1507   BILITOT 0.4  11/23/2022 1507   BILIDIR 0.1 02/07/2014 1623   IBILI 0.4 02/07/2014 1623      Component Value Date/Time   TSH 0.962 11/23/2022 1507     Assessment and Plan:   Prediabetes Assessment & Plan: Lab Results  Component Value Date   HGBA1C 6.0 (H) 11/23/2022   He has + fam hx of type II diabetes and obesity He is not taking metformin He is less physically active than he used to be  Will look for A1c improvements with changes to diet, exercise and weight reduction   Class 2 severe obesity due to excess calories with serious comorbidity and body mass index (BMI) of 35.0 to 35.9 in adult [facility])  Primary osteoarthritis of both knees Assessment & Plan: C/o L>R knee pain due to severe arthritis which does limit his ability to exercise like he used to.  He is managed by Dr T.    Will look for improvements in knee pain with weight reduction and adding in forms of exercise that are easier to do with DJD knee pain like water exercise, recumbent bike, upper body weight training, core exercises   Benign essential hypertension Assessment & Plan: BP is well controlled on Valsartan-hydrochlorothiazide 320/25 mg daily, metoprolol XL 25 mg daily and amlodopine 10 mg daily.  He denies CP or HA.  Continue all BP medication per PCP Look for HTN improvements with weight loss Avoid use of AOMs that may worsen blood pressure         Obesity Treatment / Action Plan:  Patient will work on garnering support from family and friends to begin weight loss journey. Will work on eliminating or reducing the presence of highly palatable, calorie dense foods in the home. Will complete provided nutritional and psychosocial assessment questionnaire before the next appointment. Will be scheduled for indirect calorimetry to determine resting energy expenditure in a fasting state.  This will allow [place] to create a reduced calorie, high-protein meal plan to promote loss of fat mass while preserving muscle  mass. Will think about ideas on how to incorporate physical activity into their daily routine. Counseled on the health benefits of losing 5%-15% of total body weight. Was counseled on nutritional approaches to weight loss and benefits of reducing processed foods and consuming plant-based foods and high quality protein as part of nutritional weight management. Was counseled on pharmacotherapy and role as an adjunct in weight management.   Obesity Education Performed Today:  He was weighed on the bioimpedance scale and results were discussed and documented in the synopsis.  We discussed obesity as a disease and the importance of a more detailed evaluation of all the factors contributing to the disease.  We discussed the importance of long term lifestyle changes which include nutrition, exercise and behavioral  modifications as well as the importance of customizing this to his specific health and social needs.  We discussed the benefits of reaching a healthier weight to alleviate the symptoms of existing conditions and reduce the risks of the biomechanical, metabolic and psychological effects of obesity.  Neziah Gillihan appears to be in the action stage of change and states they are ready to start intensive lifestyle modifications and behavioral modifications.  30 minutes was spent today on this visit including the above counseling, pre-visit chart review, and post-visit documentation.  Reviewed by clinician on day of visit: allergies, medications, problem list, medical history, surgical history, family history, social history, and previous encounter notes pertinent to obesity diagnosis.    Seymour Bars, D.O. DABFM, DABOM [facility] [phone] [place], [place] [address] [phone]

## 2022-12-15 NOTE — Assessment & Plan Note (Signed)
BP is well controlled on Valsartan-hydrochlorothiazide 320/25 mg daily, metoprolol XL 25 mg daily and amlodopine 10 mg daily.  He denies CP or HA.  Continue all BP medication per PCP Look for HTN improvements with weight loss Avoid use of AOMs that may worsen blood pressure

## 2022-12-15 NOTE — Assessment & Plan Note (Signed)
C/o L>R knee pain due to severe arthritis which does limit his ability to exercise like he used to.  He is managed by Dr T.    Will look for improvements in knee pain with weight reduction and adding in forms of exercise that are easier to do with DJD knee pain like water exercise, recumbent bike, upper body weight training, core exercises

## 2022-12-23 ENCOUNTER — Ambulatory Visit (INDEPENDENT_AMBULATORY_CARE_PROVIDER_SITE_OTHER): Payer: BC Managed Care – PPO | Admitting: Family Medicine

## 2022-12-23 ENCOUNTER — Encounter: Payer: Self-pay | Admitting: Family Medicine

## 2022-12-23 ENCOUNTER — Ambulatory Visit (HOSPITAL_BASED_OUTPATIENT_CLINIC_OR_DEPARTMENT_OTHER): Payer: BC Managed Care – PPO | Attending: Internal Medicine | Admitting: Internal Medicine

## 2022-12-23 VITALS — BP 142/85 | HR 67 | Temp 97.7°F | Ht 75.0 in | Wt 289.0 lb

## 2022-12-23 VITALS — Ht 75.0 in | Wt 296.0 lb

## 2022-12-23 DIAGNOSIS — R5383 Other fatigue: Secondary | ICD-10-CM

## 2022-12-23 DIAGNOSIS — R0602 Shortness of breath: Secondary | ICD-10-CM | POA: Diagnosis not present

## 2022-12-23 DIAGNOSIS — E785 Hyperlipidemia, unspecified: Secondary | ICD-10-CM

## 2022-12-23 DIAGNOSIS — Z1331 Encounter for screening for depression: Secondary | ICD-10-CM

## 2022-12-23 DIAGNOSIS — G4733 Obstructive sleep apnea (adult) (pediatric): Secondary | ICD-10-CM

## 2022-12-23 DIAGNOSIS — R7303 Prediabetes: Secondary | ICD-10-CM

## 2022-12-23 DIAGNOSIS — Z6836 Body mass index (BMI) 36.0-36.9, adult: Secondary | ICD-10-CM

## 2022-12-23 DIAGNOSIS — G894 Chronic pain syndrome: Secondary | ICD-10-CM

## 2022-12-23 DIAGNOSIS — I10 Essential (primary) hypertension: Secondary | ICD-10-CM | POA: Diagnosis not present

## 2022-12-23 DIAGNOSIS — E66812 Obesity, class 2: Secondary | ICD-10-CM

## 2022-12-23 NOTE — Assessment & Plan Note (Signed)
Lab Results  Component Value Date   HGBA1C 6.0 (H) 11/23/2022   He reports a history of prediabetes with a positive family history for type 2 diabetes.  He is consuming some high sugar foods and drinks, excess starches and has never used metformin.  Begin prescribed dietary plan which is low in added sugar and excess starches.  Will focus on lean protein and fiber with meals.  Recommend tracking of daily steps and ultimately adding in resistance training either at the gym or from home 2 to 3 days a week.  Check fasting insulin today.

## 2022-12-23 NOTE — Assessment & Plan Note (Signed)
Chronic joint pain of the knees, hips and lower back have been factors in his weight gain over the past 10 years with less physical activity.  Though his job is fairly active, his level of exercise has reduced.  He is using over-the-counter ibuprofen and Tylenol as needed.  Look for improvements in weightbearing joint pain with weight loss.  He may need to fine forms of exercise that are more conducive to his joint and back pain.

## 2022-12-23 NOTE — Progress Notes (Signed)
At a Glance:  Vitals Temp: 97.7 F (36.5 C) BP: (!) 142/85 Pulse Rate: 67 SpO2: 97 %   Anthropometric Measurements Height: 6\' 3"  (1.905 m) Weight: 289 lb (131.1 kg) BMI (Calculated): 36.12 Starting Weight: 289lb Peak Weight: 300lb   Body Composition  Body Fat %: 34.5 % Fat Mass (lbs): 100 lbs Muscle Mass (lbs): 180.4 lbs Total Body Water (lbs): 130.2 lbs Visceral Fat Rating : 19   Other Clinical Data RMR: [id number] Fasting: Yes Labs: Yes Today's Visit #: 1 Starting Date: 12/23/22    EKG: Normal sinus rhythm, rate 63.  Indirect Calorimeter completed today shows a VO2 of 400 and a REE of 2765.  His calculated basal metabolic rate is 1610 thus his basal metabolic rate is better than expected.  Chief Complaint:  Obesity   Subjective:  Jerry Escobar (MR# [id number]) is a 52 y.o. male who presents for evaluation and treatment of obesity and related comorbidities.   Jerry Escobar is currently in the action stage of change and ready to dedicate time achieving and maintaining a healthier weight. Jerry Escobar is interested in becoming our patient and working on intensive lifestyle modifications including (but not limited to) diet and exercise for weight loss.  Jerry Escobar has been struggling with his weight. He has been unsuccessful in either losing weight, maintaining weight loss, or reaching his healthy weight goal.  Jerry Escobar's habits were reviewed today and are as follows: His family eats meals together, his desired weight loss is 25 lb, he started gaining weight 10 years ago. his heaviest weight ever was 300  pounds, he skips meals frequently, he has problems with excessive hunger, he frequently eats larger portions than normal, and he struggles with emotional eating.  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   Other Fatigue Sanjuan admits to daytime somnolence and admits to waking up still tired. Patient has a history of symptoms of daytime fatigue and hypertension.  Jerry Escobar generally gets 4 hours of sleep per night, and states that he has nightime awakenings. Snoring is present. Apneic episodes are present. Epworth Sleepiness Score is 6. He is awaiting another polysomnogram.  Shortness of Breath Jerry Escobar notes increasing shortness of breath with exercising and seems to be worsening over time with weight gain. He notes getting out of breath sooner with activity than he used to. This has gotten worse recently. Jerry Escobar denies shortness of breath at rest or orthopnea.   Depression Screen Jerry Escobar's Food and Mood (modified PHQ-9) score was 5.     10/27/2022    3:25 PM  Depression screen PHQ 2/9  Decreased Interest 0  Down, Depressed, Hopeless 0  PHQ - 2 Score 0     Assessment and Plan:   Other Fatigue Jerry Escobar does feel that his weight is causing his energy to be lower than it should be. Fatigue may be related to obesity, depression or many other causes. Labs will be ordered, and in the meanwhile, Brayson will focus on self care including making healthy food choices, increasing physical activity and focusing on stress reduction.  Shortness of Breath Jerry Escobar does feel that he gets out of breath more easily that he used to when he exercises. Jerry Escobar's shortness of breath appears to be obesity related and exercise induced. He has agreed to work on weight loss and gradually increase exercise to treat his exercise induced shortness of breath. Will continue to monitor closely.   Problem List Items Addressed This Visit     Chronic pain syndrome (Chronic)  Chronic joint pain of the knees, hips and lower back have been factors in his weight gain over the past 10 years with less physical activity.  Though his job is fairly active, his level of exercise has reduced.  He is using over-the-counter ibuprofen and Tylenol as needed.  Look for improvements in weightbearing joint pain with weight loss.  He may need to fine forms of exercise that are more conducive to his joint  and back pain.      Benign essential hypertension    BP is elevated today He reports good compliance taking valsartan/HCTZ 320/25 mg once daily, metoprolol succinate 25 mg once daily and amlodipine 10 mg once daily.  He denies chest pain or headache. He has a positive family history for hypertension.  Continue current blood pressure medication.  Look for improvements with weight reduction.      Obesity   Hyperlipidemia    Lab Results  Component Value Date   CHOL 215 (H) 11/23/2022   HDL 43 11/23/2022   LDLCALC 146 (H) 11/23/2022   TRIG 144 11/23/2022   CHOLHDL 5.0 11/23/2022  He recently had his cholesterol checked 11/23/2022, reviewed today.  His triglycerides and HDL are at goal but his LDL cholesterol was elevated at 146.  He is currently not on any lipid-lowering medication.  He denies previous history of heart disease or stroke.  Begin prescribed dietary plan which is low in saturated fat.  Look for improvements over the next 6 months.  Follow-up with PCP for further management.      Prediabetes    Lab Results  Component Value Date   HGBA1C 6.0 (H) 11/23/2022   He reports a history of prediabetes with a positive family history for type 2 diabetes.  He is consuming some high sugar foods and drinks, excess starches and has never used metformin.  Begin prescribed dietary plan which is low in added sugar and excess starches.  Will focus on lean protein and fiber with meals.  Recommend tracking of daily steps and ultimately adding in resistance training either at the gym or from home 2 to 3 days a week.  Check fasting insulin today.      Obstructive sleep apnea    He has previously tested positive for OSA with an AHI of 11.9.  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  He reports daytime somnolence and waking up in the middle of the night.  He is scheduled for a repeat polysomnogram.  Await findings of his polysomnogram.  Will be  working to increase sleep time to 7 to 8 hours/day.  Begin active plan for weight reduction.      Other Visit Diagnoses     SOBOE (shortness of breath on exertion)    -  Primary   Other fatigue       Relevant Orders   EKG 12-Lead   VITAMIN D 25 Hydroxy (Vit-D Deficiency, Fractures)   Insulin, random   Folate   Vitamin B12       Jerry Escobar is currently in the action stage of change and his goal is to continue with weight loss efforts. I recommend Jerry Escobar begin the structured treatment plan as follows:  He has agreed to Category 4 Plan  Exercise goals: All adults should avoid inactivity. Some activity is better than none, and adults who participate in any amount of physical activity, gain some health benefits. - will plan to add in resistance training  2-3 x a week over the next two months  Behavioral modification strategies:increasing lean protein intake, decreasing simple carbohydrates, increasing vegetables, increase H2O intake, decrease liquid calories, increase high fiber foods, decreasing eating out, no skipping meals, meal planning and cooking strategies, keeping healthy foods in the home, better snacking choices, avoiding temptations, and planning for success  He was informed of the importance of frequent follow-up visits to maximize his success with intensive lifestyle modifications for his multiple health conditions. He was informed we would discuss his lab results at his next visit unless there is a critical issue that needs to be addressed sooner. Petra agreed to keep his next visit at the agreed upon time to discuss these results.  Objective:  General: Cooperative, alert, well developed, in no acute distress. HEENT: Conjunctivae and lids unremarkable. Cardiovascular: Regular rhythm.  Lungs: Normal work of breathing. Neurologic: No focal deficits.   Lab Results  Component Value Date   CREATININE 1.16 11/23/2022   BUN 17 11/23/2022   NA 142 11/23/2022   K 4.1 11/23/2022    CL 103 11/23/2022   CO2 23 11/23/2022   Lab Results  Component Value Date   ALT 44 11/23/2022   AST 37 11/23/2022   ALKPHOS 67 11/23/2022   BILITOT 0.4 11/23/2022   Lab Results  Component Value Date   HGBA1C 6.0 (H) 11/23/2022   HGBA1C 5.5 02/21/2021   HGBA1C 5.9 (H) 01/31/2020   HGBA1C 5.6 10/17/2018   HGBA1C 5.6 11/04/2017   No results found for: "INSULIN" Lab Results  Component Value Date   TSH 0.962 11/23/2022   Lab Results  Component Value Date   CHOL 215 (H) 11/23/2022   HDL 43 11/23/2022   LDLCALC 146 (H) 11/23/2022   TRIG 144 11/23/2022   CHOLHDL 5.0 11/23/2022   Lab Results  Component Value Date   WBC 5.0 11/23/2022   HGB 14.2 11/23/2022   HCT 43.9 11/23/2022   MCV 85 11/23/2022   PLT 192 11/23/2022   Lab Results  Component Value Date   IRON 93 02/20/2014   TIBC 347 02/20/2014   FERRITIN 222 02/20/2014    Attestation Statements:  Reviewed by clinician on day of visit: allergies, medications, problem list, medical history, surgical history, family history, social history, and previous encounter notes.  Time spent on visit including pre-visit chart review and post-visit charting and care was 40 minutes.   Glennis Brink, DO

## 2022-12-23 NOTE — Assessment & Plan Note (Signed)
He has previously tested positive for OSA with an AHI of 11.9.  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  He reports daytime somnolence and waking up in the middle of the night.  He is scheduled for a repeat polysomnogram.  Await findings of his polysomnogram.  Will be working to increase sleep time to 7 to 8 hours/day.  Begin active plan for weight reduction.

## 2022-12-23 NOTE — Assessment & Plan Note (Signed)
BP is elevated today He reports good compliance taking valsartan/HCTZ 320/25 mg once daily, metoprolol succinate 25 mg once daily and amlodipine 10 mg once daily.  He denies chest pain or headache. He has a positive family history for hypertension.  Continue current blood pressure medication.  Look for improvements with weight reduction.

## 2022-12-23 NOTE — Assessment & Plan Note (Signed)
Lab Results  Component Value Date   CHOL 215 (H) 11/23/2022   HDL 43 11/23/2022   LDLCALC 146 (H) 11/23/2022   TRIG 144 11/23/2022   CHOLHDL 5.0 11/23/2022  He recently had his cholesterol checked 11/23/2022, reviewed today.  His triglycerides and HDL are at goal but his LDL cholesterol was elevated at 146.  He is currently not on any lipid-lowering medication.  He denies previous history of heart disease or stroke.  Begin prescribed dietary plan which is low in saturated fat.  Look for improvements over the next 6 months.  Follow-up with PCP for further management.

## 2022-12-24 DIAGNOSIS — F411 Generalized anxiety disorder: Secondary | ICD-10-CM | POA: Diagnosis not present

## 2022-12-24 LAB — INSULIN, RANDOM: INSULIN: 32.4 u[IU]/mL — ABNORMAL HIGH (ref 2.6–24.9)

## 2022-12-24 LAB — VITAMIN B12: Vitamin B-12: 733 pg/mL (ref 232–1245)

## 2022-12-24 LAB — VITAMIN D 25 HYDROXY (VIT D DEFICIENCY, FRACTURES): Vit D, 25-Hydroxy: 27.7 ng/mL — ABNORMAL LOW (ref 30.0–100.0)

## 2022-12-24 LAB — FOLATE: Folate: 18.2 ng/mL (ref >3.0)

## 2022-12-31 DIAGNOSIS — F411 Generalized anxiety disorder: Secondary | ICD-10-CM | POA: Diagnosis not present

## 2023-01-06 ENCOUNTER — Other Ambulatory Visit: Payer: Self-pay | Admitting: Sports Medicine

## 2023-01-06 ENCOUNTER — Ambulatory Visit: Payer: BC Managed Care – PPO | Admitting: Family Medicine

## 2023-01-06 DIAGNOSIS — N529 Male erectile dysfunction, unspecified: Secondary | ICD-10-CM

## 2023-01-07 ENCOUNTER — Other Ambulatory Visit: Payer: Self-pay | Admitting: Sports Medicine

## 2023-01-07 DIAGNOSIS — I10 Essential (primary) hypertension: Secondary | ICD-10-CM

## 2023-01-11 ENCOUNTER — Other Ambulatory Visit (INDEPENDENT_AMBULATORY_CARE_PROVIDER_SITE_OTHER): Payer: BC Managed Care – PPO

## 2023-01-11 ENCOUNTER — Telehealth: Payer: Self-pay | Admitting: Sports Medicine

## 2023-01-11 ENCOUNTER — Ambulatory Visit: Payer: BC Managed Care – PPO | Admitting: Sports Medicine

## 2023-01-11 ENCOUNTER — Encounter: Payer: Self-pay | Admitting: Sports Medicine

## 2023-01-11 VITALS — BP 147/88 | HR 69

## 2023-01-11 DIAGNOSIS — M170 Bilateral primary osteoarthritis of knee: Secondary | ICD-10-CM

## 2023-01-11 MED ORDER — TRIAMCINOLONE ACETONIDE 40 MG/ML IJ SUSP
40.0000 mg | Freq: Once | INTRAMUSCULAR | Status: AC
  Administered 2023-01-11: 40 mg via INTRAMUSCULAR

## 2023-01-11 NOTE — Addendum Note (Signed)
Addended by: Carren Rang A on: 01/11/2023 02:57 PM   Modules accepted: Orders

## 2023-01-11 NOTE — Progress Notes (Signed)
    Procedures performed today:    Procedure: Real-time Ultrasound Guided aspiration of right knee Device: Samsung HS60  Verbal informed consent obtained.  Time-out conducted.  Noted no overlying erythema, induration, or other signs of local infection.  Skin prepped in a sterile fashion.  Local anesthesia: Topical Ethyl chloride.  With sterile technique and under real time ultrasound guidance: Noted effusion, aspirated 6 mL of clear, straw-colored fluid. Completed without difficulty  Advised to call if fevers/chills, erythema, induration, drainage, or persistent bleeding.  Images permanently stored and available for review in PACS.  Impression: Technically successful ultrasound guided aspiration.  Independent interpretation of notes and tests performed by another provider:   None.  Brief History, Exam, Impression, and Recommendations:    Primary osteoarthritis of both knees Initial improvements with injection at the last visit, now with recurrence of pain and swelling, we did a therapeutic arthrocentesis, he will increase his diligence with his ibuprofen, ice 20 minutes 3-4 times a day and we we will work on getting him approved for viscosupplementation.    ____________________________________________ Ihor Austin. Benjamin Stain, M.D., ABFM., CAQSM., AME. Primary Care and Sports Medicine  [facility]  Adjunct Professor of Family Medicine  [employer]  Restaurant manager, fast food

## 2023-01-11 NOTE — Assessment & Plan Note (Signed)
Initial improvements with injection at the last visit, now with recurrence of pain and swelling, we did a therapeutic arthrocentesis, he will increase his diligence with his ibuprofen, ice 20 minutes 3-4 times a day and we we will work on getting him approved for viscosupplementation.

## 2023-01-11 NOTE — Telephone Encounter (Signed)
[facility] approval please, bilateral x-ray confirmed osteoarthritis, failed oral NSAIDs, 6 weeks of physical therapy, steroid injections.

## 2023-01-19 NOTE — Telephone Encounter (Signed)
 PA information submitted via [email or web address] for [facility] Paperwork has been printed and given to Dr. Karie Schwalbe for signatures. Once obtained, information will be faxed to [employer] at [phone]

## 2023-01-27 ENCOUNTER — Encounter: Payer: Self-pay | Admitting: Family Medicine

## 2023-01-27 ENCOUNTER — Ambulatory Visit: Payer: BC Managed Care – PPO | Admitting: Family Medicine

## 2023-01-27 VITALS — BP 148/84 | HR 77 | Temp 98.0°F | Ht 75.0 in | Wt 282.0 lb

## 2023-01-27 DIAGNOSIS — M170 Bilateral primary osteoarthritis of knee: Secondary | ICD-10-CM | POA: Diagnosis not present

## 2023-01-27 DIAGNOSIS — E66812 Obesity, class 2: Secondary | ICD-10-CM

## 2023-01-27 DIAGNOSIS — Z6835 Body mass index (BMI) 35.0-35.9, adult: Secondary | ICD-10-CM

## 2023-01-27 DIAGNOSIS — E559 Vitamin D deficiency, unspecified: Secondary | ICD-10-CM

## 2023-01-27 DIAGNOSIS — E785 Hyperlipidemia, unspecified: Secondary | ICD-10-CM

## 2023-01-27 DIAGNOSIS — E88819 Insulin resistance, unspecified: Secondary | ICD-10-CM | POA: Insufficient documentation

## 2023-01-27 DIAGNOSIS — I10 Essential (primary) hypertension: Secondary | ICD-10-CM | POA: Diagnosis not present

## 2023-01-27 DIAGNOSIS — R7303 Prediabetes: Secondary | ICD-10-CM

## 2023-01-27 MED ORDER — VITAMIN D (ERGOCALCIFEROL) 1.25 MG (50000 UNIT) PO CAPS: 50000.0000 [IU] | ORAL_CAPSULE | ORAL | 0 refills | Status: DC

## 2023-01-27 NOTE — Assessment & Plan Note (Signed)
 Blood pressure is elevated today.  He denies chest pain, leg edema or heart palpitations.  He reports good compliance taking Diovan  HCTZ 320/25 mg once daily, metoprolol  XL 25 mg once daily and amlodipine  10 mg once daily.  He did just take his blood pressure medication about 15 minutes before his visit.  He does have a home blood pressure cuff.  Continue all antihypertensive medication as directed.  He is welcome to bring in his home blood pressure cuff next visit to make sure he is checking it correctly. Continue active plan for weight reduction.

## 2023-01-27 NOTE — Assessment & Plan Note (Signed)
 Last vitamin D  Lab Results  Component Value Date   VD25OH 27.7 (L) 12/23/2022   New Reviewed lab results with patient.  He is currently not on a vitamin D  supplement.  He has some complaints of fatigue. We reviewed how vitamin D  deficiency can cause fatigue, bone loss, poor immune function  Begin vitamin D  50,000 IU once weekly Recheck level in 3 to 4 months

## 2023-01-27 NOTE — Assessment & Plan Note (Signed)
 Lab Results  Component Value Date   HGBA1C 6.0 (H) 11/23/2022   Patient had previously had an A1c done in November at 6.0 in the prediabetic range.  He has a positive family history for type 2 diabetes.  He has never taken medication for prediabetes.  He is at risk for type 2 diabetes with obesity, a high visceral fat rating and a positive family history.  He is doing well with weight reduction on his prescribed dietary plans with plans to increase regular exercise.  Continue to work on lifestyle changes.  Consider use of metformin if not improving over the next 4 months.

## 2023-01-27 NOTE — Progress Notes (Signed)
 Office: [phone]  /  Fax: [phone]  WEIGHT SUMMARY AND BIOMETRICS  Starting Date: 12/23/22  Starting Weight: 289lb   Weight Lost Since Last Visit: 7lb   Vitals Temp: 98 F (36.7 C) BP: (!) 148/84 Pulse Rate: 77 SpO2: 95 %   Body Composition  Body Fat %: 34.3 % Fat Mass (lbs): 96.8 lbs Muscle Mass (lbs): 176.4 lbs Total Body Water (lbs): 126.4 lbs Visceral Fat Rating : 19   HPI  Chief Complaint: OBESITY  Jerry Escobar is here to discuss his progress with his obesity treatment plan. He is on the the Category 4 Plan and states he is following his eating plan approximately 50 % of the time. He states he is exercising 0 minutes 0 times per week.  Interval History:  Since last office visit he is down 7 lb He did get off plan a bit over the holidays but is making good choices He has R > L DJD pain in knees using ice and ibuprofen  prn with some swelling -- getting worse Walking at work some  He plans to add in use of the bike and weights at [employer]  Pharmacotherapy: none  PHYSICAL EXAM:  Blood pressure (!) 148/84, pulse 77, temperature 98 F (36.7 C), height 6' 3 (1.905 m), weight 282 lb (127.9 kg), SpO2 95%. Body mass index is 35.25 kg/m.  General: He is overweight, cooperative, alert, well developed, and in no acute distress. PSYCH: Has normal mood, affect and thought process.   Lungs: Normal breathing effort, no conversational dyspnea.   ASSESSMENT AND PLAN  TREATMENT PLAN FOR OBESITY:  Recommended Dietary Goals  Benecio is currently in the action stage of change. As such, his goal is to continue weight management plan. He has agreed to the Category 4 Plan.  Behavioral Intervention  We discussed the following Behavioral Modification Strategies today: increasing lean protein intake to established goals, decreasing simple carbohydrates , increasing vegetables, increasing water intake , work on meal planning and preparation, keeping healthy foods at  home, identifying sources and decreasing liquid calories, decreasing eating out or consumption of processed foods, and making healthy choices when eating convenient foods, continue to work on implementation of reduced calorie nutritional plan, and continue to work on maintaining a reduced calorie state, getting the recommended amount of protein, incorporating whole foods, making healthy choices, staying well hydrated and practicing mindfulness when eating..  Additional resources provided today: NA  Recommended Physical Activity Goals  Saivion has been advised to work up to 150 minutes of moderate intensity aerobic activity a week and strengthening exercises 2-3 times per week for cardiovascular health, weight loss maintenance and preservation of muscle mass.   He has agreed to Start aerobic activity with a goal of 150 minutes a week at moderate intensity.   Pharmacotherapy changes for the treatment of obesity: none  ASSOCIATED CONDITIONS ADDRESSED TODAY  Vitamin D  deficiency Assessment & Plan: Last vitamin D  Lab Results  Component Value Date   VD25OH 27.7 (L) 12/23/2022   New Reviewed lab results with patient.  He is currently not on a vitamin D  supplement.  He has some complaints of fatigue. We reviewed how vitamin D  deficiency can cause fatigue, bone loss, poor immune function  Begin vitamin D  50,000 IU once weekly Recheck level in 3 to 4 months  Orders: -     Vitamin D  (Ergocalciferol ); Take 1 capsule (50,000 Units total) by mouth every 7 (seven) days.  Dispense: 5 capsule; Refill: 0  Class 2 severe obesity  due to excess calories with serious comorbidity and body mass index (BMI) of 35.0 to 35.9 in adult [facility])  Insulin  resistance Assessment & Plan: New Reviewed lab results with patient Fasting insulin  high at 32.4 He has signs of hyperinsulinemia including hyperphagia He has never used metformin His exercise has been limited due to right DJD knee pain He has started his  prescribed dietary plan which is low in sugar and starches  Continue working on prescribed dietary plan which is low in added sugar and low in refined carbohydrates.  Plan to add in exercise at [employer] including resistance training exercise and use of an exercise bike with a goal of 3 days a week to aid in insulin  sensitivity.  Insulin  resistant handout given.   Prediabetes Assessment & Plan: Lab Results  Component Value Date   HGBA1C 6.0 (H) 11/23/2022   Patient had previously had an A1c done in November at 6.0 in the prediabetic range.  He has a positive family history for type 2 diabetes.  He has never taken medication for prediabetes.  He is at risk for type 2 diabetes with obesity, a high visceral fat rating and a positive family history.  He is doing well with weight reduction on his prescribed dietary plans with plans to increase regular exercise.  Continue to work on lifestyle changes.  Consider use of metformin if not improving over the next 4 months.   Primary osteoarthritis of both knees Assessment & Plan: Right greater than left knee pain managed by Dr. ONEIDA.  He had arthrocentesis done since his last visit, awaiting Synvisc injections.  He would like to avoid joint replacement surgery at this point in time.  His knee pain does limit his physical activity though his job does require some walking.  Continue plan of care per Dr. ONEIDA.  Add in  use of an exercise bike and weight training as tolerated 3 days a week.  Continue active plan for weight reduction.   Benign essential hypertension Assessment & Plan: Blood pressure is elevated today.  He denies chest pain, leg edema or heart palpitations.  He reports good compliance taking Diovan  HCTZ 320/25 mg once daily, metoprolol  XL 25 mg once daily and amlodipine  10 mg once daily.  He did just take his blood pressure medication about 15 minutes before his visit.  He does have a home blood pressure cuff.  Continue all  antihypertensive medication as directed.  He is welcome to bring in his home blood pressure cuff next visit to make sure he is checking it correctly. Continue active plan for weight reduction.   Hyperlipidemia, unspecified hyperlipidemia type Assessment & Plan: Lab Results  Component Value Date   CHOL 215 (H) 11/23/2022   HDL 43 11/23/2022   LDLCALC 146 (H) 11/23/2022   TRIG 144 11/23/2022   CHOLHDL 5.0 11/23/2022  Cholesterol was recently updated by PCP 11/4 including a high LDL of 146.  He has not been on any lipid-lowering medication.  He denies a history of cardiovascular disease.  He has started working on his prescribed dietary plan which is low in saturated fat along with weight reduction.  He plans added more regular exercise.  Plan to recheck fasting lipid panel over the next 5 to 6 months       He was informed of the importance of frequent follow up visits to maximize his success with intensive lifestyle modifications for his multiple health conditions.   ATTESTASTION STATEMENTS:  Reviewed by clinician on day of  visit: allergies, medications, problem list, medical history, surgical history, family history, social history, and previous encounter notes pertinent to obesity diagnosis.   I have personally spent 30 minutes total time today in preparation, patient care, nutritional counseling and documentation for this visit, including the following: review of clinical lab tests; review of medical tests/procedures/services.      Darice FORBES Haddock, DO DABFM, DABOM [facility] [address] [place], [place] [address] [phone]

## 2023-01-27 NOTE — Telephone Encounter (Signed)
 Benefits Investigation Details received from [employer]: Orthovisc PA required: Yes May fill through: Buy and Bill OR [facility] Copay/Coinsurance: $40 Product Copay: 0% Administration Coinsurance: 0% Administration Copay: 0% Deductible: $500 (met: $0) Out of Pocket Max: $3000 (met: $0)

## 2023-01-27 NOTE — Assessment & Plan Note (Signed)
 Lab Results  Component Value Date   CHOL 215 (H) 11/23/2022   HDL 43 11/23/2022   LDLCALC 146 (H) 11/23/2022   TRIG 144 11/23/2022   CHOLHDL 5.0 11/23/2022  Cholesterol was recently updated by PCP 11/4 including a high LDL of 146.  He has not been on any lipid-lowering medication.  He denies a history of cardiovascular disease.  He has started working on his prescribed dietary plan which is low in saturated fat along with weight reduction.  He plans added more regular exercise.  Plan to recheck fasting lipid panel over the next 5 to 6 months

## 2023-01-27 NOTE — Assessment & Plan Note (Signed)
 Right greater than left knee pain managed by Dr. ONEIDA.  He had arthrocentesis done since his last visit, awaiting Synvisc injections.  He would like to avoid joint replacement surgery at this point in time.  His knee pain does limit his physical activity though his job does require some walking.  Continue plan of care per Dr. ONEIDA.  Add in  use of an exercise bike and weight training as tolerated 3 days a week.  Continue active plan for weight reduction.

## 2023-01-27 NOTE — Assessment & Plan Note (Signed)
 New Reviewed lab results with patient Fasting insulin  high at 32.4 He has signs of hyperinsulinemia including hyperphagia He has never used metformin His exercise has been limited due to right DJD knee pain He has started his prescribed dietary plan which is low in sugar and starches  Continue working on prescribed dietary plan which is low in added sugar and low in refined carbohydrates.  Plan to add in exercise at [employer] including resistance training exercise and use of an exercise bike with a goal of 3 days a week to aid in insulin  sensitivity.  Insulin  resistant handout given.

## 2023-01-29 DIAGNOSIS — F411 Generalized anxiety disorder: Secondary | ICD-10-CM | POA: Diagnosis not present

## 2023-02-02 ENCOUNTER — Other Ambulatory Visit: Payer: Self-pay | Admitting: Sports Medicine

## 2023-02-02 DIAGNOSIS — R351 Nocturia: Secondary | ICD-10-CM

## 2023-02-11 ENCOUNTER — Other Ambulatory Visit: Payer: Self-pay | Admitting: Sports Medicine

## 2023-02-11 DIAGNOSIS — I10 Essential (primary) hypertension: Secondary | ICD-10-CM

## 2023-02-17 DIAGNOSIS — F411 Generalized anxiety disorder: Secondary | ICD-10-CM | POA: Diagnosis not present

## 2023-02-18 ENCOUNTER — Encounter: Payer: Self-pay | Admitting: Medical-Surgical

## 2023-02-18 ENCOUNTER — Ambulatory Visit: Payer: BC Managed Care – PPO | Admitting: Medical-Surgical

## 2023-02-18 VITALS — BP 122/75 | HR 95 | Temp 98.8°F | Resp 20 | Ht 75.0 in | Wt 288.0 lb

## 2023-02-18 DIAGNOSIS — J101 Influenza due to other identified influenza virus with other respiratory manifestations: Secondary | ICD-10-CM | POA: Diagnosis not present

## 2023-02-18 DIAGNOSIS — Z20828 Contact with and (suspected) exposure to other viral communicable diseases: Secondary | ICD-10-CM | POA: Diagnosis not present

## 2023-02-18 LAB — POCT INFLUENZA A/B
Influenza A, POC: POSITIVE — AB
Influenza B, POC: NEGATIVE

## 2023-02-18 LAB — POC COVID19 BINAXNOW: SARS Coronavirus 2 Ag: NEGATIVE

## 2023-02-18 MED ORDER — HYDROCODONE BIT-HOMATROP MBR 5-1.5 MG/5ML PO SOLN: 5.0000 mL | Freq: Three times a day (TID) | ORAL | 0 refills | Status: DC | PRN

## 2023-02-18 MED ORDER — OSELTAMIVIR PHOSPHATE 75 MG PO CAPS: 75.0000 mg | ORAL_CAPSULE | Freq: Two times a day (BID) | ORAL | 0 refills | Status: DC

## 2023-02-18 NOTE — Progress Notes (Signed)
        Established patient visit  History, exam, impression, and plan:  1. Exposure to influenza (Primary) Pleasant 53 year old male presenting today with complaints of approximately 3 days of upper respiratory symptoms including body aches, headache, sinus congestion, postnasal drip, and cough.  He is producing yellowish-green sputum with his cough as well as nasal discharge.  Denies fevers, chills, shortness of breath, chest pain, and GI symptoms.  Recent exposure to his wife who is a Financial controller.  She has tested positive for influenza.  POCT testing today shows negative COVID, negative flu B, positive flu A. - POCT Influenza A/B - POC COVID-19  2. Influenza A Start Tamiflu 75 mg twice daily x 5 days.  Sending in Hycodan cough syrup to help with nighttime coughing.  Reviewed recommendations for over-the-counter cough and cold medications that are consistent with patients with high blood pressure such as Coricidin or the high blood pressure version of DayQuil and NyQuil.  Reviewed recommendations for rest and increase fluid.  Discussed the timeline for expectations of symptom resolution.  Advised him to reach out via MyChart or phone should he have no improvement in his symptoms by Monday next week or if he has improvement followed by resickening.  Procedures performed this visit: None.  Return if symptoms worsen or fail to improve.  __________________________________ Thayer Ohm, DNP, APRN, FNP-BC Primary Care and Sports Medicine [facility] [place]

## 2023-02-18 NOTE — Patient Instructions (Signed)
Medications & Home Remedies for Upper Respiratory Illness   Note: the following list assumes no pregnancy, normal liver & kidney function and no other drug interactions. Always ask a pharmacist or qualified medical provider if you have any questions!    Aches/Pains, Fever, Headache OTC Acetaminophen (Tylenol) 500 mg tablets - take max 2 tablets (1000 mg) every 6 hours (4 times per day)  OTC Ibuprofen (Motrin) 200 mg tablets - take max 4 tablets (800 mg) every 6 hours*   Sinus Congestion Prescription Atrovent as directed OTC Nasal Saline if desired to rinse OTC Oxymetolazone (Afrin, others) sparing use due to rebound congestion, NEVER use in kids OTC Phenylephrine (Sudafed) 10 mg tablets every 4 hours (or the 12-hour formulation)* OTC Diphenhydramine (Benadryl) 25 mg tablets - take max 2 tablets every 4 hours   Cough & Sore Throat Prescription cough pills or syrups as directed OTC Dextromethorphan (Robitussin, others) - cough suppressant OTC Guaifenesin (Robitussin, Mucinex, others) - expectorant (helps cough up mucus) (Dextromethorphan and Guaifenesin also come in a combination tablet/syrup) OTC Lozenges w/ Benzocaine + Menthol (Cepacol) Honey - as much as you want! Teas which "coat the throat" - look for ingredients Elm Bark, Licorice Root, Marshmallow Root   Other Prescription Oral Steroids to decrease inflammation and improve energy Prescription Antibiotics if these are necessary for bacterial infection - take ALL, even if you're feeling better  OTC Zinc Lozenges within 24 hours of symptoms onset - mixed evidence this shortens the duration of the common cold Don't waste your money on Vitamin C or Echinacea in acute illness - it's already too late!    *Caution in patients with high blood pressure

## 2023-02-22 ENCOUNTER — Encounter: Payer: Self-pay | Admitting: Sports Medicine

## 2023-02-23 ENCOUNTER — Ambulatory Visit: Payer: BC Managed Care – PPO | Admitting: Family Medicine

## 2023-02-23 ENCOUNTER — Encounter: Payer: Self-pay | Admitting: Family Medicine

## 2023-02-23 VITALS — BP 144/84 | HR 95 | Temp 98.4°F | Ht 75.0 in | Wt 277.0 lb

## 2023-02-23 DIAGNOSIS — R7303 Prediabetes: Secondary | ICD-10-CM | POA: Diagnosis not present

## 2023-02-23 DIAGNOSIS — E66811 Obesity, class 1: Secondary | ICD-10-CM | POA: Diagnosis not present

## 2023-02-23 DIAGNOSIS — I10 Essential (primary) hypertension: Secondary | ICD-10-CM | POA: Diagnosis not present

## 2023-02-23 DIAGNOSIS — E559 Vitamin D deficiency, unspecified: Secondary | ICD-10-CM

## 2023-02-23 DIAGNOSIS — Z6834 Body mass index (BMI) 34.0-34.9, adult: Secondary | ICD-10-CM

## 2023-02-23 MED ORDER — VITAMIN D (ERGOCALCIFEROL) 1.25 MG (50000 UNIT) PO CAPS: 50000.0000 [IU] | ORAL_CAPSULE | ORAL | 0 refills | Status: DC

## 2023-02-23 NOTE — Assessment & Plan Note (Signed)
 Blood pressure is elevated today.  He denies chest pain or headache.  He reports good compliance taking amlodipine  10 mg daily, metoprolol  XL 25 mg once daily and Diovan  HCT 320/25 mg once daily.  He is currently on over-the-counter cold and flu medication.  Continue all blood pressure medication as directed. Consult PCP about use of over-the-counter cold and flu medication if needed. Continue active plan for weight reduction

## 2023-02-23 NOTE — Progress Notes (Signed)
 Office: [phone]  /  Fax: [phone]  WEIGHT SUMMARY AND BIOMETRICS  Starting Date: 12/23/22  Starting Weight: 289lb   Weight Lost Since Last Visit: 5lb   Vitals Temp: 98.4 F (36.9 C) BP: (!) 144/84 Pulse Rate: 95 SpO2: 95 %   Body Composition  Body Fat %: 33.5 % Fat Mass (lbs): 93 lbs Muscle Mass (lbs): 175.8 lbs Total Body Water (lbs): 125.2 lbs Visceral Fat Rating : 18     HPI  Chief Complaint: OBESITY  Jerry Escobar is here to discuss his progress with his obesity treatment plan. He is on the the Category 4 Plan and states he is following his eating plan approximately 60 % of the time. He states he is walking 30 minutes 5 times per week.   Interval History:  Since last office visit he is down 5 lb This gives him a net weight loss of 12 lb in the past 2 mos of medically supervised weight management This is a 4.1% total body weight loss He is mindful of his food choices He is recovering from the flu and had some celebrations since his last visit His knee pain is improving He is awaiting an in - lab sleep study His work clothes are fitting better He is walking 30 minutes 5 days a week  Pharmacotherapy: none  PHYSICAL EXAM:  Blood pressure (!) 144/84, pulse 95, temperature 98.4 F (36.9 C), height 6' 3 (1.905 m), weight 277 lb (125.6 kg), SpO2 95%. Body mass index is 34.62 kg/m.  General: He is overweight, cooperative, alert, well developed, and in no acute distress. PSYCH: Has normal mood, affect and thought process.   Lungs: Normal breathing effort, no conversational dyspnea.   ASSESSMENT AND PLAN  TREATMENT PLAN FOR OBESITY:  Recommended Dietary Goals  Jerry Escobar is currently in the action stage of change. As such, his goal is to continue weight management plan. He has agreed to the Category 4 Plan.  Behavioral Intervention  We discussed the following Behavioral Modification Strategies today: increasing lean protein intake to established  goals, increasing vegetables, increasing lower glycemic fruits, increasing fiber rich foods, increasing water intake , work on meal planning and preparation, reading food labels , keeping healthy foods at home, work on managing stress, creating time for self-care and relaxation, planning for success, and continue to work on maintaining a reduced calorie state, getting the recommended amount of protein, incorporating whole foods, making healthy choices, staying well hydrated and practicing mindfulness when eating..  Additional resources provided today: NA  Recommended Physical Activity Goals  Jerry Escobar has been advised to work up to 150 minutes of moderate intensity aerobic activity a week and strengthening exercises 2-3 times per week for cardiovascular health, weight loss maintenance and preservation of muscle mass.   He has agreed to Work on scheduling and tracking physical activity.   Pharmacotherapy changes for the treatment of obesity: None  ASSOCIATED CONDITIONS ADDRESSED TODAY  Prediabetes Assessment & Plan: Lab Results  Component Value Date   HGBA1C 6.0 (H) 11/23/2022   He has declined use of medication for prediabetes or obesity management.  He is actively working on weight reduction on his prescribed dietary plan.  He is doing a good job reducing his intake of added sugar and refined carbohydrates.  He has been more consistent with regular exercise other than this past week where he is out of the flu.  Continue to work on a reduced calorie low sugar/low starch diet, regular exercise and weight reduction. Plan to repeat  A1c in the next 2 months   Vitamin D  deficiency Assessment & Plan: Last vitamin D  Lab Results  Component Value Date   VD25OH 27.7 (L) 12/23/2022   He is currently on vitamin D  50,000 IU once weekly.  His energy level is starting to improve and he is tolerating this well.  Repeat vitamin D  level in the next 2 months  Orders: -     Vitamin D  (Ergocalciferol );  Take 1 capsule (50,000 Units total) by mouth every 7 (seven) days.  Dispense: 5 capsule; Refill: 0  Class 1 obesity due to excess calories with serious comorbidity and body mass index (BMI) of 34.0 to 34.9 in adult  Benign essential hypertension Assessment & Plan: Blood pressure is elevated today.  He denies chest pain or headache.  He reports good compliance taking amlodipine  10 mg daily, metoprolol  XL 25 mg once daily and Diovan  HCT 320/25 mg once daily.  He is currently on over-the-counter cold and flu medication.  Continue all blood pressure medication as directed. Consult PCP about use of over-the-counter cold and flu medication if needed. Continue active plan for weight reduction       He was informed of the importance of frequent follow up visits to maximize his success with intensive lifestyle modifications for his multiple health conditions.   ATTESTASTION STATEMENTS:  Reviewed by clinician on day of visit: allergies, medications, problem list, medical history, surgical history, family history, social history, and previous encounter notes pertinent to obesity diagnosis.   I have personally spent 30 minutes total time today in preparation, patient care, nutritional counseling and documentation for this visit, including the following: review of clinical lab tests; review of medical tests/procedures/services.      Darice FORBES Haddock, DO DABFM, DABOM [facility] [address] [place], [place] [address] [phone]

## 2023-02-23 NOTE — Assessment & Plan Note (Signed)
Last vitamin D Lab Results  Component Value Date   VD25OH 27.7 (L) 12/23/2022   He is currently on vitamin D 50,000 IU once weekly.  His energy level is starting to improve and he is tolerating this well.  Repeat vitamin D level in the next 2 months

## 2023-02-23 NOTE — Assessment & Plan Note (Signed)
 Lab Results  Component Value Date   HGBA1C 6.0 (H) 11/23/2022   He has declined use of medication for prediabetes or obesity management.  He is actively working on weight reduction on his prescribed dietary plan.  He is doing a good job reducing his intake of added sugar and refined carbohydrates.  He has been more consistent with regular exercise other than this past week where he is out of the flu.  Continue to work on a reduced calorie low sugar/low starch diet, regular exercise and weight reduction. Plan to repeat A1c in the next 2 months

## 2023-02-24 ENCOUNTER — Other Ambulatory Visit: Payer: Self-pay | Admitting: Sports Medicine

## 2023-03-07 ENCOUNTER — Other Ambulatory Visit: Payer: Self-pay | Admitting: Sports Medicine

## 2023-03-07 DIAGNOSIS — G4726 Circadian rhythm sleep disorder, shift work type: Secondary | ICD-10-CM

## 2023-03-11 DIAGNOSIS — F411 Generalized anxiety disorder: Secondary | ICD-10-CM | POA: Diagnosis not present

## 2023-03-23 ENCOUNTER — Encounter: Payer: Self-pay | Admitting: Family Medicine

## 2023-03-23 ENCOUNTER — Ambulatory Visit: Payer: BC Managed Care – PPO | Admitting: Family Medicine

## 2023-03-23 VITALS — BP 156/77 | HR 83 | Temp 98.7°F | Ht 75.0 in | Wt 279.0 lb

## 2023-03-23 DIAGNOSIS — E66811 Obesity, class 1: Secondary | ICD-10-CM

## 2023-03-23 DIAGNOSIS — R7303 Prediabetes: Secondary | ICD-10-CM | POA: Diagnosis not present

## 2023-03-23 DIAGNOSIS — Z6834 Body mass index (BMI) 34.0-34.9, adult: Secondary | ICD-10-CM

## 2023-03-23 DIAGNOSIS — M170 Bilateral primary osteoarthritis of knee: Secondary | ICD-10-CM

## 2023-03-23 DIAGNOSIS — E559 Vitamin D deficiency, unspecified: Secondary | ICD-10-CM

## 2023-03-23 DIAGNOSIS — E6609 Other obesity due to excess calories: Secondary | ICD-10-CM

## 2023-03-23 MED ORDER — VITAMIN D (ERGOCALCIFEROL) 1.25 MG (50000 UNIT) PO CAPS
50000.0000 [IU] | ORAL_CAPSULE | ORAL | 0 refills | Status: DC
Start: 2023-03-23 — End: 2023-08-30

## 2023-03-23 NOTE — Progress Notes (Signed)
 Office: [phone]  /  Fax: [phone]  WEIGHT SUMMARY AND BIOMETRICS  Starting Date: 12/23/22  Starting Weight: 289lb   Weight Lost Since Last Visit: 0lb   Vitals Temp: 98.7 F (37.1 C) BP: (!) 156/77 Pulse Rate: 83 SpO2: 98 %   Body Composition  Body Fat %: 43 % Fat Mass (lbs): 120.4 lbs Muscle Mass (lbs): 151.4 lbs Total Body Water (lbs): 105.2 lbs Visceral Fat Rating : 11     HPI  Chief Complaint: OBESITY  Jerry Escobar is here to discuss his progress with his obesity treatment plan. He is on the the Category 4 Plan and states he is following his eating plan approximately 20 % of the time. He states he is walking more.    Interval History:  Since last office visit he is up 2 lb He has been stressed and choosing more comfort foods He is mindful of his portions and has not indulged much in sweets His wife is a flight attendant and has been stressed following the death of co-workers from flight crash in [place] He has 2 teenage grandkids at the house until the end of the school year He has been more R knee pain (severe DJD) He has a net weight loss of 10 lb in 3 mos of medically supervised weight management He works nights with limited sleep  Pharmacotherapy: none  PHYSICAL EXAM:  Blood pressure (!) 156/77, pulse 83, temperature 98.7 F (37.1 C), height 6\' 3"  (1.905 m), weight 279 lb (126.6 kg), SpO2 98%. Body mass index is 34.87 kg/m.  General: He is overweight, cooperative, alert, well developed, and in no acute distress. PSYCH: Has normal mood, affect and thought process.   Lungs: Normal breathing effort, no conversational dyspnea.   ASSESSMENT AND PLAN  TREATMENT PLAN FOR OBESITY:  Recommended Dietary Goals  Jerry Escobar is currently in the action stage of change. As such, his goal is to continue weight management plan. He has agreed to the Category 4 Plan.  Behavioral Intervention  We discussed the following Behavioral Modification Strategies today:  increasing lean protein intake to established goals, increasing fiber rich foods, increasing water intake , work on meal planning and preparation, keeping healthy foods at home, decreasing eating out or consumption of processed foods, and making healthy choices when eating convenient foods, practice mindfulness eating and understand the difference between hunger signals and cravings, work on managing stress, creating time for self-care and relaxation, and continue to work on maintaining a reduced calorie state, getting the recommended amount of protein, incorporating whole foods, making healthy choices, staying well hydrated and practicing mindfulness when eating..  Additional resources provided today: NA  Recommended Physical Activity Goals  Jerry Escobar has been advised to work up to 150 minutes of moderate intensity aerobic activity a week and strengthening exercises 2-3 times per week for cardiovascular health, weight loss maintenance and preservation of muscle mass.   He has agreed to [employer] strengthening exercises with a goal of 2-3 sessions a week   Pharmacotherapy changes for the treatment of obesity: none  ASSOCIATED CONDITIONS ADDRESSED TODAY  Primary osteoarthritis of both knees R > L knee pain has hindered walking for exercise and hurts the most when going up and down stairs.  This has hindered his weight loss progress.  Encouraged him to f/u with Dr T to discuss Synvisc injection  Vitamin D deficiency Last vitamin D Lab Results  Component Value Date   VD25OH 27.7 (L) 12/23/2022   Taking vitamin D weekly as directed Repeat  lab next visit  -     Vitamin D (Ergocalciferol); Take 1 capsule (50,000 Units total) by mouth every 7 (seven) days.  Dispense: 5 capsule; Refill: 0  Class 1 obesity due to excess calories with serious comorbidity and body mass index (BMI) of 34.0 to 34.9 in adult  Prediabetes Lab Results  Component Value Date   HGBA1C 6.0 (H) 11/23/2022   He would like to  stay off of metformin and is actively working on weight reduction and healthy dietary changes.  He agrees to adding in resistance training 2-3 x a week.    He was informed of the importance of frequent follow up visits to maximize his success with intensive lifestyle modifications for his multiple health conditions.   ATTESTASTION STATEMENTS:  Reviewed by clinician on day of visit: allergies, medications, problem list, medical history, surgical history, family history, social history, and previous encounter notes pertinent to obesity diagnosis.   I have personally spent 30 minutes total time today in preparation, patient care, nutritional counseling and documentation for this visit, including the following: review of clinical lab tests; review of medical tests/procedures/services.      Glennis Brink, DO DABFM, DABOM [facility] [address] [place], [place] [address] [phone]

## 2023-03-25 DIAGNOSIS — F411 Generalized anxiety disorder: Secondary | ICD-10-CM | POA: Diagnosis not present

## 2023-04-15 DIAGNOSIS — F411 Generalized anxiety disorder: Secondary | ICD-10-CM | POA: Diagnosis not present

## 2023-04-21 ENCOUNTER — Other Ambulatory Visit: Payer: Self-pay | Admitting: Family Medicine

## 2023-04-21 DIAGNOSIS — E559 Vitamin D deficiency, unspecified: Secondary | ICD-10-CM

## 2023-04-26 ENCOUNTER — Ambulatory Visit: Admitting: Family Medicine

## 2023-05-20 DIAGNOSIS — F411 Generalized anxiety disorder: Secondary | ICD-10-CM | POA: Diagnosis not present

## 2023-06-02 ENCOUNTER — Other Ambulatory Visit: Payer: Self-pay | Admitting: Family Medicine

## 2023-06-02 ENCOUNTER — Other Ambulatory Visit: Payer: Self-pay | Admitting: Sports Medicine

## 2023-06-02 DIAGNOSIS — E559 Vitamin D deficiency, unspecified: Secondary | ICD-10-CM

## 2023-06-24 DIAGNOSIS — F411 Generalized anxiety disorder: Secondary | ICD-10-CM | POA: Diagnosis not present

## 2023-07-09 ENCOUNTER — Other Ambulatory Visit: Payer: Self-pay | Admitting: Family Medicine

## 2023-07-09 DIAGNOSIS — E559 Vitamin D deficiency, unspecified: Secondary | ICD-10-CM

## 2023-07-21 DIAGNOSIS — F411 Generalized anxiety disorder: Secondary | ICD-10-CM | POA: Diagnosis not present

## 2023-07-23 ENCOUNTER — Other Ambulatory Visit: Payer: Self-pay | Admitting: Sports Medicine

## 2023-07-23 DIAGNOSIS — R351 Nocturia: Secondary | ICD-10-CM

## 2023-07-26 NOTE — Telephone Encounter (Signed)
 Patient scheduled on 08/02/23, thanks.

## 2023-08-02 ENCOUNTER — Encounter: Payer: Self-pay | Admitting: Sports Medicine

## 2023-08-02 ENCOUNTER — Ambulatory Visit

## 2023-08-02 ENCOUNTER — Ambulatory Visit: Admitting: Sports Medicine

## 2023-08-02 VITALS — BP 123/75 | HR 78 | Ht 75.0 in | Wt 286.0 lb

## 2023-08-02 DIAGNOSIS — J0100 Acute maxillary sinusitis, unspecified: Secondary | ICD-10-CM

## 2023-08-02 DIAGNOSIS — M79661 Pain in right lower leg: Secondary | ICD-10-CM

## 2023-08-02 DIAGNOSIS — R0981 Nasal congestion: Secondary | ICD-10-CM

## 2023-08-02 MED ORDER — FLUTICASONE PROPIONATE 50 MCG/ACT NA SUSP: 1.0000 | Freq: Every day | NASAL | 2 refills | Status: DC

## 2023-08-02 NOTE — Progress Notes (Signed)
    Procedures performed today:    None.  Independent interpretation of notes and tests performed by another provider:   None.  Brief History, Exam, Impression, and Recommendations:    Right calf pain Very pleasant 53 year old male, he has had about 3 weeks of pain right lateral calf proximal to the musculotendinous junction of the gastrocnemius lateral head. No trauma, no changes in activity, footwear, he did have a long trip where he sat in the plane for period of time recently. On exam he has visible swelling right worse than left with 2+ pitting edema, negative Homans' sign and tenderness directly over the muscle belly itself of the lateral head of the gastrocnemius. Negative Thompson's test, good motion, good strength, nothing radicular. We will start conservatively, we will do a DVT rule out ultrasound, he will continue ibuprofen  as needed, calf strain exercises given. If not better in about 4 weeks we can further evaluate his knee as a potential pain generator.    ____________________________________________ Debby PARAS. Curtis, M.D., ABFM., CAQSM., AME. Primary Care and Sports Medicine [facility]  Adjunct Professor of [facility] Medicine  [employer]  Restaurant manager, fast food

## 2023-08-02 NOTE — Assessment & Plan Note (Signed)
 Very pleasant 53 year old male, he has had about 3 weeks of pain right lateral calf proximal to the musculotendinous junction of the gastrocnemius lateral head. No trauma, no changes in activity, footwear, he did have a long trip where he sat in the plane for period of time recently. On exam he has visible swelling right worse than left with 2+ pitting edema, negative Homans' sign and tenderness directly over the muscle belly itself of the lateral head of the gastrocnemius. Negative Thompson's test, good motion, good strength, nothing radicular. We will start conservatively, we will do a DVT rule out ultrasound, he will continue ibuprofen  as needed, calf strain exercises given. If not better in about 4 weeks we can further evaluate his knee as a potential pain generator.

## 2023-08-03 ENCOUNTER — Ambulatory Visit: Payer: Self-pay | Admitting: Sports Medicine

## 2023-08-16 ENCOUNTER — Other Ambulatory Visit: Payer: Self-pay | Admitting: Sports Medicine

## 2023-08-16 DIAGNOSIS — G4726 Circadian rhythm sleep disorder, shift work type: Secondary | ICD-10-CM

## 2023-08-16 NOTE — Telephone Encounter (Signed)
 Please advise on refill request

## 2023-08-24 ENCOUNTER — Other Ambulatory Visit: Payer: Self-pay | Admitting: Sports Medicine

## 2023-08-24 NOTE — Procedures (Signed)
 Per appt notes: no data.

## 2023-08-25 DIAGNOSIS — F411 Generalized anxiety disorder: Secondary | ICD-10-CM | POA: Diagnosis not present

## 2023-08-30 ENCOUNTER — Encounter: Payer: Self-pay | Admitting: Sports Medicine

## 2023-08-30 ENCOUNTER — Ambulatory Visit: Admitting: Sports Medicine

## 2023-08-30 ENCOUNTER — Ambulatory Visit

## 2023-08-30 DIAGNOSIS — M11262 Other chondrocalcinosis, left knee: Secondary | ICD-10-CM | POA: Diagnosis not present

## 2023-08-30 DIAGNOSIS — M170 Bilateral primary osteoarthritis of knee: Secondary | ICD-10-CM

## 2023-08-30 DIAGNOSIS — M25461 Effusion, right knee: Secondary | ICD-10-CM | POA: Diagnosis not present

## 2023-08-30 DIAGNOSIS — M25462 Effusion, left knee: Secondary | ICD-10-CM | POA: Diagnosis not present

## 2023-08-30 MED ORDER — CELECOXIB 200 MG PO CAPS: ORAL_CAPSULE | ORAL | 2 refills | Status: AC

## 2023-08-30 NOTE — Progress Notes (Signed)
    Procedures performed today:    None.  Independent interpretation of notes and tests performed by another provider:   None.  Brief History, Exam, Impression, and Recommendations:    Primary osteoarthritis of both knees Very pleasant 53 year old male, known bilateral knee osteoarthritis right worse than left. His last injection was in December 2024. He has had arthroscopy multiple times. At this point we decided on a conservative approach, he will get a knee sleeve, we will add home physical therapy, Celebrex . He will do this for 6 weeks, if insufficient improvement we will proceed with injection therapy.    ____________________________________________ Debby PARAS. Curtis, M.D., ABFM., CAQSM., AME. Primary Care and Sports Medicine [facility]  Adjunct Professor of [facility] Medicine  [employer]  Restaurant manager, fast food

## 2023-08-30 NOTE — Assessment & Plan Note (Signed)
 Very pleasant 53 year old male, known bilateral knee osteoarthritis right worse than left. His last injection was in December 2024. He has had arthroscopy multiple times. At this point we decided on a conservative approach, he will get a knee sleeve, we will add home physical therapy, Celebrex . He will do this for 6 weeks, if insufficient improvement we will proceed with injection therapy.

## 2023-08-30 NOTE — Patient Instructions (Signed)
 SABRA

## 2023-09-10 ENCOUNTER — Ambulatory Visit: Payer: Self-pay | Admitting: Sports Medicine

## 2023-09-21 ENCOUNTER — Encounter: Payer: Self-pay | Admitting: Sports Medicine

## 2023-09-22 DIAGNOSIS — F411 Generalized anxiety disorder: Secondary | ICD-10-CM | POA: Diagnosis not present

## 2023-10-12 ENCOUNTER — Ambulatory Visit: Admitting: Sports Medicine

## 2023-10-14 ENCOUNTER — Encounter: Payer: Self-pay | Admitting: Sports Medicine

## 2023-10-14 ENCOUNTER — Ambulatory Visit (INDEPENDENT_AMBULATORY_CARE_PROVIDER_SITE_OTHER): Admitting: Sports Medicine

## 2023-10-14 VITALS — BP 128/82 | Ht 75.0 in | Wt 286.0 lb

## 2023-10-14 DIAGNOSIS — M170 Bilateral primary osteoarthritis of knee: Secondary | ICD-10-CM

## 2023-10-14 NOTE — Progress Notes (Signed)
 Patient ID: Jerry Escobar, male   DOB: October 22, 1970, 53 y.o.   MRN: [id number]  Patient presents today for follow-up on right knee osteoarthritis.  He has improved with home exercises and Celebrex .  Still gets some occasional buckling and pain in the right knee but overall he feels better.  Physical exam was not repeated today.  Given his overall improvement, we will hold on further treatment at this time.  We can refill his Celebrex  when needed.  He is encouraged to continue his home exercises and he will follow-up with us  as needed.  This note was dictated using Dragon naturally speaking software and may contain errors in syntax, spelling, or content which have not been identified prior to signing this note.

## 2023-10-25 ENCOUNTER — Ambulatory Visit: Admitting: Physician Assistant

## 2023-10-25 ENCOUNTER — Encounter: Payer: Self-pay | Admitting: Physician Assistant

## 2023-10-25 VITALS — BP 142/92 | HR 83 | Ht 75.0 in | Wt 291.0 lb

## 2023-10-25 DIAGNOSIS — Z0000 Encounter for general adult medical examination without abnormal findings: Secondary | ICD-10-CM | POA: Diagnosis not present

## 2023-10-25 DIAGNOSIS — E782 Mixed hyperlipidemia: Secondary | ICD-10-CM

## 2023-10-25 DIAGNOSIS — H6123 Impacted cerumen, bilateral: Secondary | ICD-10-CM | POA: Insufficient documentation

## 2023-10-25 DIAGNOSIS — I10 Essential (primary) hypertension: Secondary | ICD-10-CM | POA: Diagnosis not present

## 2023-10-25 DIAGNOSIS — Z125 Encounter for screening for malignant neoplasm of prostate: Secondary | ICD-10-CM | POA: Diagnosis not present

## 2023-10-25 DIAGNOSIS — Z6836 Body mass index (BMI) 36.0-36.9, adult: Secondary | ICD-10-CM

## 2023-10-25 DIAGNOSIS — E66812 Obesity, class 2: Secondary | ICD-10-CM

## 2023-10-25 DIAGNOSIS — G4726 Circadian rhythm sleep disorder, shift work type: Secondary | ICD-10-CM

## 2023-10-25 DIAGNOSIS — R7303 Prediabetes: Secondary | ICD-10-CM

## 2023-10-25 DIAGNOSIS — N400 Enlarged prostate without lower urinary tract symptoms: Secondary | ICD-10-CM

## 2023-10-25 DIAGNOSIS — M170 Bilateral primary osteoarthritis of knee: Secondary | ICD-10-CM | POA: Diagnosis not present

## 2023-10-25 DIAGNOSIS — E559 Vitamin D deficiency, unspecified: Secondary | ICD-10-CM | POA: Diagnosis not present

## 2023-10-25 DIAGNOSIS — Z1329 Encounter for screening for other suspected endocrine disorder: Secondary | ICD-10-CM

## 2023-10-25 MED ORDER — VALSARTAN-HYDROCHLOROTHIAZIDE 320-25 MG PO TABS: 1.0000 | ORAL_TABLET | Freq: Every day | ORAL | 3 refills | Status: AC

## 2023-10-25 MED ORDER — METOPROLOL SUCCINATE ER 25 MG PO TB24
25.0000 mg | ORAL_TABLET | Freq: Every day | ORAL | 3 refills | Status: AC
Start: 2023-10-25 — End: ?

## 2023-10-25 MED ORDER — AMLODIPINE BESYLATE 10 MG PO TABS: 10.0000 mg | ORAL_TABLET | Freq: Every day | ORAL | 3 refills | Status: AC

## 2023-10-25 MED ORDER — ZOLPIDEM TARTRATE 10 MG PO TABS: ORAL_TABLET | ORAL | 1 refills | Status: AC

## 2023-10-25 NOTE — Patient Instructions (Signed)
 Get labs today.  Ok to use debrox solution for wax in ears.

## 2023-10-25 NOTE — Progress Notes (Signed)
 Established Patient Office Visit  Subjective   Patient ID: Jerry Escobar, male    DOB: [birth year]  Age: 53 y.o. MRN: [id number]  Chief Complaint  Patient presents with   Medical Management of Chronic Issues    HPI Discussed the use of AI scribe software for clinical note transcription with the patient, who gave verbal consent to proceed.  History of Present Illness Jerry Escobar is a 53 year old male who presents with prescription refills and evaluation of right ear blockage. His provider left clinic a few weeks ago.   Aural fullness (right ear blockage) - Sensation of blockage in the right ear for the past couple of weeks - Symptoms are particularly noticeable when lying down - Attempts to alleviate the sensation by moving the ear around - No associated fever, chills, sinus issues, or pressure  Allergic rhinitis - History of allergies to dust and pollen - No current allergy symptoms as allergy season has passed  Hypertension - Currently taking metoprolol  for blood pressure management - Blood pressure slightly elevated during the visit - Home blood pressure readings typically in the 130s - Monitors blood pressure periodically at home - Occasional alcohol consumption without perceived impact on blood pressure - denies any CP, palpitations, headaches, dizziness or vision changes  Chronic pain - Currently taking Celebrex  once daily for pain management - Finds Celebrex  effective for pain control  Insomnia -Ambien  working well, no concerns.   Preventive health maintenance - No recent vaccinations - Due for laboratory work as it has been a while since last tests - Currently in a transition phase with healthcare providers   ROS See HPI.    Objective:     BP (!) 142/92   Pulse 83   Ht 6' 3 (1.905 m)   Wt 291 lb (132 kg)   SpO2 99%   BMI 36.37 kg/m   Cerumen Removal Template: Indication: Cerumen impaction of the bilateral ear(s) Medical necessity statement:  On physical examination, cerumen impairs clinically significant portions of the external auditory canal, and tympanic membrane. Noted obstructive, copious cerumen that cannot be removed without magnification and instrumentations requiring physician skills Consent: Discussed benefits and risks of procedure and verbal consent obtained Procedure: Patient was prepped for the procedure. Utilized an otoscope to assess and take note of the ear canal, the tympanic membrane, and the presence, amount, and placement of the cerumen. Gentle water irrigation and soft plastic curette was utilized to remove cerumen.  Post procedure examination shows cerumen was completely removed. Patient tolerated procedure well. The patient is made aware that they may experience temporary vertigo, temporary hearing loss, and temporary discomfort. If these symptom last for more than 24 hours to call the clinic or proceed to the ED.  Physical Exam Constitutional:      Appearance: Normal appearance. He is obese.  HENT:     Head: Normocephalic.     Ears:     Comments: Bilateral cerumen impaction after irrigation TMs clear bilaterally with some erythema in external canal.     Nose: Nose normal.     Mouth/Throat:     Mouth: Mucous membranes are moist.  Eyes:     Conjunctiva/sclera: Conjunctivae normal.  Cardiovascular:     Rate and Rhythm: Normal rate and regular rhythm.  Pulmonary:     Effort: Pulmonary effort is normal.     Breath sounds: Normal breath sounds.  Musculoskeletal:     Cervical back: Normal range of motion and neck supple. No tenderness.  Lymphadenopathy:  Cervical: No cervical adenopathy.  Neurological:     General: No focal deficit present.     Mental Status: He is alert and oriented to person, place, and time.  Psychiatric:        Mood and Affect: Mood normal.       The 10-year ASCVD risk score (Arnett DK, et al., 2019) is: 12.1%    Assessment & Plan:  Raghav was seen today for medical  management of chronic issues.  Diagnoses and all orders for this visit:  Bilateral impacted cerumen  Primary osteoarthritis of both knees -     CBC with Differential/Platelet  Shift work sleep disorder -     zolpidem  (AMBIEN ) 10 MG tablet; TAKE 1 TABLET BU MOUTH 1 HOUR BEFORE YOU WANT TO BE ASLEEP  Essential hypertension, benign -     VITAMIN D  25 Hydroxy (Vit-D Deficiency, Fractures) -     valsartan -hydrochlorothiazide  (DIOVAN -HCT) 320-25 MG tablet; Take 1 tablet by mouth daily. -     amLODipine  (NORVASC ) 10 MG tablet; Take 1 tablet (10 mg total) by mouth daily. -     metoprolol  succinate (TOPROL -XL) 25 MG 24 hr tablet; Take 1 tablet (25 mg total) by mouth daily.  Mixed hyperlipidemia -     CBC with Differential/Platelet -     Lipid panel  Prediabetes -     CMP14+EGFR -     Hemoglobin A1c  Prostate cancer screening -     PSA, total and free  Benign prostatic hyperplasia without lower urinary tract symptoms -     PSA, total and free  Thyroid  disorder screening -     TSH  Preventative health care -     CBC with Differential/Platelet -     CMP14+EGFR -     Lipid panel -     PSA, total and free -     TSH -     VITAMIN D  25 Hydroxy (Vit-D Deficiency, Fractures) -     Hemoglobin A1c  Class 2 severe obesity due to excess calories with serious comorbidity and body mass index (BMI) of 36.0 to 36.9 in adult   Assessment & Plan Right ear cerumen impaction Right ear nearly occluded, causing discomfort. No systemic symptoms. - bilateral ears irrigated today.  - Re-evaluate right ear post-cleaning to assess tympanic membrane and both TM's look great. Some external canal redness.  - debrox as needed for cerumen accumulation  Essential hypertension Blood pressure elevated, likely stress-related. Home readings in target range. Lifestyle modifications discussed. - Refill metoprolol , amlodipine , diovan /HCT prescriptions. - Encourage lifestyle modifications: reduced sodium  intake, increased exercise.  Insomnia - refilled ambien  for sleep  Bilateral primary osteoarthritis of knee Managed with Celebrex  once daily. Discussed increasing dose if needed. - Continue Celebrex  once daily. - Consider increasing Celebrex  to twice daily or 2 tablets if pain management inadequate.  Fasting labs ordered today.       Apurva Reily, PA-C

## 2023-10-26 ENCOUNTER — Encounter: Payer: Self-pay | Admitting: Physician Assistant

## 2023-10-26 DIAGNOSIS — N138 Other obstructive and reflux uropathy: Secondary | ICD-10-CM | POA: Insufficient documentation

## 2023-10-26 DIAGNOSIS — N400 Enlarged prostate without lower urinary tract symptoms: Secondary | ICD-10-CM | POA: Insufficient documentation

## 2023-10-26 LAB — CMP14+EGFR
ALT: 30 IU/L (ref 0–44)
AST: 29 IU/L (ref 0–40)
Albumin: 4.4 g/dL (ref 3.8–4.9)
Alkaline Phosphatase: 57 IU/L (ref 47–123)
BUN/Creatinine Ratio: 13 (ref 9–20)
BUN: 16 mg/dL (ref 6–24)
Bilirubin Total: 0.4 mg/dL (ref 0.0–1.2)
CO2: 24 mmol/L (ref 20–29)
Calcium: 9 mg/dL (ref 8.7–10.2)
Chloride: 104 mmol/L (ref 96–106)
Creatinine, Ser: 1.2 mg/dL (ref 0.76–1.27)
Globulin, Total: 2.8 g/dL (ref 1.5–4.5)
Glucose: 100 mg/dL — ABNORMAL HIGH (ref 70–99)
Potassium: 3.9 mmol/L (ref 3.5–5.2)
Sodium: 141 mmol/L (ref 134–144)
Total Protein: 7.2 g/dL (ref 6.0–8.5)
eGFR: 73 mL/min/1.73 (ref >59)

## 2023-10-26 LAB — CBC WITH DIFFERENTIAL/PLATELET
Basophils Absolute: 0 x10E3/uL (ref 0.0–0.2)
Basos: 1 %
EOS (ABSOLUTE): 0.1 x10E3/uL (ref 0.0–0.4)
Eos: 3 %
Hematocrit: 42.4 % (ref 37.5–51.0)
Hemoglobin: 13.9 g/dL (ref 13.0–17.7)
Immature Grans (Abs): 0 x10E3/uL (ref 0.0–0.1)
Immature Granulocytes: 0 %
Lymphocytes Absolute: 1.2 x10E3/uL (ref 0.7–3.1)
Lymphs: 30 %
MCH: 28 pg (ref 26.6–33.0)
MCHC: 32.8 g/dL (ref 31.5–35.7)
MCV: 85 fL (ref 79–97)
Monocytes Absolute: 0.4 x10E3/uL (ref 0.1–0.9)
Monocytes: 10 %
Neutrophils Absolute: 2.3 x10E3/uL (ref 1.4–7.0)
Neutrophils: 56 %
Platelets: 173 x10E3/uL (ref 150–450)
RBC: 4.97 x10E6/uL (ref 4.14–5.80)
RDW: 13.6 % (ref 11.6–15.4)
WBC: 4.1 x10E3/uL (ref 3.4–10.8)

## 2023-10-26 LAB — PSA, TOTAL AND FREE
PSA, Free Pct: 18.3 %
PSA, Free: 0.22 ng/mL
Prostate Specific Ag, Serum: 1.2 ng/mL (ref 0.0–4.0)

## 2023-10-26 LAB — LIPID PANEL
Chol/HDL Ratio: 4.6 ratio (ref 0.0–5.0)
Cholesterol, Total: 194 mg/dL (ref 100–199)
HDL: 42 mg/dL (ref >39)
LDL Chol Calc (NIH): 131 mg/dL — ABNORMAL HIGH (ref 0–99)
Triglycerides: 114 mg/dL (ref 0–149)
VLDL Cholesterol Cal: 21 mg/dL (ref 5–40)

## 2023-10-26 LAB — HEMOGLOBIN A1C
Est. average glucose Bld gHb Est-mCnc: 117 mg/dL
Hgb A1c MFr Bld: 5.7 % — ABNORMAL HIGH (ref 4.8–5.6)

## 2023-10-26 LAB — TSH: TSH: 0.743 u[IU]/mL (ref 0.450–4.500)

## 2023-10-26 LAB — VITAMIN D 25 HYDROXY (VIT D DEFICIENCY, FRACTURES): Vit D, 25-Hydroxy: 31.4 ng/mL (ref 30.0–100.0)

## 2023-10-27 ENCOUNTER — Ambulatory Visit: Payer: Self-pay | Admitting: Physician Assistant

## 2023-10-27 NOTE — Progress Notes (Signed)
 Jerry Escobar,   A1C improved a little, still in pre-diabetes range.  LDL improved a little as well.   Your 10 year cardiovascular risk is still above 7.5 percent and I would suggest a statin drug to lower cholesterol. Thoughts?   .The 10-year ASCVD risk score (Arnett DK, et al., 2019) is: 12.1%   Values used to calculate the score:     Age: 53 years     Clincally relevant sex: Male     Is Non-Hispanic African American: Yes     Diabetic: No     Tobacco smoker: No     Systolic Blood Pressure: 142 mmHg     Is BP treated: Yes     HDL Cholesterol: 42 mg/dL     Total Cholesterol: 194 mg/dL   Kidney/liver/prostate/thyroid  all look good.   Vitamin D  low normal. How much are you taking?

## 2023-10-27 NOTE — Progress Notes (Signed)
 Yes take it daily with dairy for better absorption.

## 2023-11-05 ENCOUNTER — Ambulatory Visit
Admission: EM | Admit: 2023-11-05 | Discharge: 2023-11-05 | Disposition: A | Discharge status: Home / Self Care | Attending: Internal Medicine | Admitting: Internal Medicine

## 2023-11-05 ENCOUNTER — Ambulatory Visit: Payer: Self-pay

## 2023-11-05 DIAGNOSIS — S0501XA Injury of conjunctiva and corneal abrasion without foreign body, right eye, initial encounter: Secondary | ICD-10-CM

## 2023-11-05 DIAGNOSIS — H5711 Ocular pain, right eye: Secondary | ICD-10-CM

## 2023-11-05 NOTE — Telephone Encounter (Signed)
 FYI Only or Action Required?: FYI only for provider.  Patient was last seen in primary care on 10/25/2023 by Antoniette Vermell CROME, PA-C.  Called Nurse Triage reporting Foreign Body in [place].  Symptoms began today.  Interventions attempted: Other: water and eye drops.  Symptoms are: unchanged.  Triage Disposition: Go to ED Now (Notify PCP)  Patient/caregiver understands and will follow disposition?: Yes        Copied from CRM #[id number]. Topic: Clinical - Red Word Triage >> Nov 05, 2023  1:05 PM Lauren C wrote: Red Word that prompted transfer to Nurse Triage: Feels like something is stuck in his eye, burning pain. Wanted to be seen at [facility] today but all locations show first avail Monday. He says all the urgent cares have long lines. Reason for Disposition  [1] Eye has been washed out > 30 minutes ago AND [2] feels like FB is still present  Answer Assessment - Initial Assessment Questions Pt states he woke up and felt like something was in his eye and rubbed it. He has tried water and eye drops and just thinks it needs flushed out.    1. TYPE OF FOREIGN BODY: What got in the eye?  unknown 2. ONSET: When did it happen?      Upon walking 3. MECHANISM: How did it happen?     unknown 4. LOCATION: Which eye, where does it seem to be located? (e.g., left, right; on cornea, under lower or upper eyelid, uncertain)      Right eye, under the top eyelide, worse when he opens his eye.  4. VISION: Do you have blurred vision?      Yes, bc eye is watering  5. PAIN: Is it painful? If Yes, ask: How bad is the pain?  (Scale 0-10; or none, mild, moderate, severe)     7-10 6. CONTACT LENS: Do you wear contacts?     No  7. OTHER SYMPTOMS: Do you have any other symptoms? (e.g., eye discharge, eye redness)     redness  Protocols used: Eye - Foreign Body-A-AH

## 2023-11-05 NOTE — ED Provider Notes (Signed)
 [facility]  Note:  This document was prepared using Dragon voice recognition software and may include unintentional dictation errors.  MRN: [id number] DOB: 1970-08-09 DATE: 11/05/23   Subjective:  Chief Complaint:  Chief Complaint  Patient presents with   Eye Problem     HPI: Jerry Escobar is a 53 y.o. male presenting for right eye pain for less than one day. Patient states he woke up this morning with a foreign body sensation in his right ey. No known injury or inciting cause. He does work with chemicals, but states that nothing was sprayed in his eyes recently. He reports using OTC eye drops and flushing the eye with no relief. Patient does not wear contacts or glasses. He reports redness of the right eye as well as a watery discharge. Reports no pruritus, but a burning sensation in the eye. Patient states he last saw an eye doctor about one year ago. 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 Denies fever, nausea/vomiting, headache, vision changes, loss of vision. Endorses right eye pain, right eye redness, watery discharge from the eye. Presents NAD.  Prior to Admission medications   Medication Sig Start Date End Date Taking? Authorizing Provider  acetaminophen  (TYLENOL ) 650 MG CR tablet Take 1 tablet (650 mg total) by mouth every 8 (eight) hours as needed for pain. 04/03/20   Curtis Debby PARAS, MD  amLODipine  (NORVASC ) 10 MG tablet Take 1 tablet (10 mg total) by mouth daily. 10/25/23   Breeback, Jade L, PA-C  celecoxib  (CELEBREX ) 200 MG capsule One to 2 tablets by mouth daily as needed for pain. 08/30/23   Curtis Debby PARAS, MD  fluticasone  (FLONASE ) 50 MCG/ACT nasal spray Place 1 spray into both nostrils daily. 08/02/23   Curtis Debby PARAS, MD  melatonin 5 MG TABS Take with Ambien  1 hour before you want to be sleeping 08/06/22   Curtis Debby PARAS, MD  metoprolol  succinate (TOPROL -XL) 25 MG 24 hr tablet Take 1  tablet (25 mg total) by mouth daily. 10/25/23   Breeback, Jade L, PA-C  tadalafil  (CIALIS ) 5 MG tablet TAKE 1 TABLET BY MOUTH DAILY 01/07/23   Curtis Debby PARAS, MD  tamsulosin  (FLOMAX ) 0.4 MG CAPS capsule 1 CAPSULE TAKEN AT THE BEGINNING OF YOUR WORKDAY 07/26/23   Curtis Debby PARAS, MD  valsartan -hydrochlorothiazide  (DIOVAN -HCT) 320-25 MG tablet Take 1 tablet by mouth daily. 10/25/23   Breeback, Jade L, PA-C  zolpidem  (AMBIEN ) 10 MG tablet TAKE 1 TABLET BU MOUTH 1 HOUR BEFORE YOU WANT TO BE ASLEEP 10/25/23   Breeback, Jade L, PA-C     Allergies  Allergen Reactions   Ace Inhibitors Cough    History:   Past Medical History:  Diagnosis Date   Arthritis    Fatty liver    GERD (gastroesophageal reflux disease)    Been noticeable as of late   Hyperlipidemia    Hypertension    Joint pain    Lactose intolerance    Seasonal allergies    Transaminitis      Past Surgical History:  Procedure Laterality Date   BICEPT TENODESIS Left 10/06/2018   Procedure: BICEPS TENODESIS;  Surgeon: Cristy Bonner DASEN, MD;  Location: [facility];  Service: Orthopedics;  Laterality: Left;   HERNIA REPAIR     KNEE ARTHROSCOPY Bilateral    ROTATOR CUFF REPAIR Bilateral    SHOULDER ARTHROSCOPY WITH ROTATOR CUFF REPAIR AND SUBACROMIAL DECOMPRESSION Left 10/06/2018   Procedure: LEFT SHOULDER ARTHROSCOPY  WITH ROTATOR CUFF REPAIR AND SUBACROMIAL DECOMPRESSION, EXTENSIVE DEBRIDEMENT, BICEP TENODESIS;  Surgeon: Cristy Bonner DASEN, MD;  Location: [facility];  Service: Orthopedics;  Laterality: Left;  BLOCK    Family History  Problem Relation Age of Onset   Obesity Mother    Obesity Father    Hypertension Father    Diabetes Father    Prostate cancer Father    Diverticulitis Father    Arthritis Father    Breast cancer Paternal Grandmother    Diabetes Brother    Colon cancer Neg Hx    Esophageal cancer Neg Hx    Pancreatic cancer Neg Hx    Stomach cancer Neg Hx    Liver disease Neg  Hx    Rectal cancer Neg Hx     Social History   Tobacco Use   Smoking status: Unknown   Smokeless tobacco: Never   Tobacco comments:    Smokes cigars socially.   Vaping Use   Vaping status: Never Used  Substance Use Topics   Alcohol use: Yes    Alcohol/week: 3.0 standard drinks of alcohol    Types: 3 Standard drinks or equivalent per week    Comment: Occasional   Drug use: No    Review of Systems  Constitutional:  Negative for fever.  HENT:  Positive for congestion and rhinorrhea.   Eyes:  Positive for pain, discharge and redness. Negative for itching and visual disturbance.  Gastrointestinal:  Negative for abdominal pain, nausea and vomiting.  Neurological:  Negative for headaches.     Objective:   Vitals: BP (!) 148/84 (BP Location: Right Arm)   Pulse 82   Temp 97.8 F (36.6 C) (Oral)   Resp 16   SpO2 95%   Physical Exam Constitutional:      General: He is not in acute distress.    Appearance: Normal appearance. He is well-developed. He is obese. He is not ill-appearing or toxic-appearing.  HENT:     Head: Normocephalic and atraumatic.  Eyes:     General: Lids are normal.        Right eye: Discharge present.        Left eye: No discharge.     Extraocular Movements: Extraocular movements intact.     Conjunctiva/sclera:     Right eye: Right conjunctiva is injected.     Left eye: Left conjunctiva is not injected.     Pupils: Pupils are equal, round, and reactive to light.     Right eye: Corneal abrasion and fluorescein uptake present.     Comments: Corneal abrasion noted at the 4 o'clock position in the right eye. Watery discharge noted on exam.  No foreign bodies or ulcerations noted on fluorescein stain.  Cardiovascular:     Rate and Rhythm: Normal rate and regular rhythm.     Heart sounds: Normal heart sounds.  Pulmonary:     Effort: Pulmonary effort is normal.     Breath sounds: Normal breath sounds.     Comments: Clear to auscultation  bilaterally  Abdominal:     General: Bowel sounds are normal.     Palpations: Abdomen is soft.     Tenderness: There is no abdominal tenderness.  Skin:    General: Skin is warm and dry.  Neurological:     General: No focal deficit present.     Mental Status: He is alert.  Psychiatric:        Mood and Affect: Mood and affect normal.  Results:  Labs: No results found for this or any previous visit (from the past 24 hours).  Radiology: No results found.   UC Course/Treatments:  Procedures: Procedures   Medications Ordered in UC: Medications - No data to display   Assessment and Plan :     ICD-10-CM   1. Abrasion of right cornea, initial encounter  S05.01XA     2. Acute right eye pain  H57.11       Abrasion of right cornea, initial encounter Acute right eye pain Afebrile, nontoxic-appearing, NAD. VSS. DDX includes but not limited to: Corneal abrasion, corneal ulceration, foreign body, bacterial conjunctivitis, allergic conjunctivitis, viral conjunctivitis Corneal abrasion noted on fluorescein stain at 4 o'clock position in the right eye.  Erythromycin  ophthalmic ointment 4 times daily was prescribed.  Recommend follow-up with ophthalmology. Recommend OTC analgesics as needed for pain. Strict ED precautions were given and patient verbalized understanding.  ED Discharge Orders          Ordered    erythromycin  ophthalmic ointment  4 times daily        11/05/23 1349             I have reviewed the PDMP during this encounter.     Brookelle Pellicane P, PA-C 11/05/23 1356

## 2023-11-05 NOTE — ED Triage Notes (Signed)
 Patient presents to UC  for right eye swelling, shooting pain, rhinorrhea, and watery drainage since today. Reports blurred vision in right eye. Has tried OTC eye drops and flushing his eye with no improvement.

## 2023-11-05 NOTE — Telephone Encounter (Signed)
 Went to urgent care.

## 2023-11-05 NOTE — Discharge Instructions (Addendum)
 You have been prescribed erythromycin  for your eyes. This is use to treat abrasions and infections of the eye. Take the prescription as directed. If no improvement, recommend you follow up with an ophthalmologist as soon as possible.  Our office is limited in the tests and imaging we can perform at our facility.  Therefore, it is very important for you to pay attention to any new symptoms or worsening of your current condition.   Please go directly to the Emergency Department immediately should you begin to feel worse in any way or have any of the following symptoms: increased pain, increased discharge, vision changes, loss of vision, nausea/vomiting or headache.

## 2023-11-30 ENCOUNTER — Ambulatory Visit: Admitting: Physician Assistant

## 2023-11-30 ENCOUNTER — Encounter: Payer: Self-pay | Admitting: Physician Assistant

## 2023-11-30 VITALS — BP 152/91 | HR 80 | Ht 75.0 in | Wt 291.0 lb

## 2023-11-30 DIAGNOSIS — Z79899 Other long term (current) drug therapy: Secondary | ICD-10-CM | POA: Diagnosis not present

## 2023-11-30 DIAGNOSIS — N400 Enlarged prostate without lower urinary tract symptoms: Secondary | ICD-10-CM

## 2023-11-30 DIAGNOSIS — N529 Male erectile dysfunction, unspecified: Secondary | ICD-10-CM | POA: Diagnosis not present

## 2023-11-30 MED ORDER — FINASTERIDE 1 MG PO TABS: 1.0000 mg | ORAL_TABLET | Freq: Every day | ORAL | 1 refills | Status: DC

## 2023-11-30 NOTE — Patient Instructions (Addendum)
 Referral to urology.   Finasteride Tablets (BPH) What is this medication? FINASTERIDE (fi NAS teer ide) treats the symptoms of an enlarged prostate (benign prostatic hyperplasia). It works by decreasing the size of the prostate. It belongs to a group of medications called 5-alpha reductase inhibitors. This medicine may be used for other purposes; ask your health care provider or pharmacist if you have questions. COMMON BRAND NAME(S): Proscar What should I tell my care team before I take this medication? They need to know if you have any of these conditions: Liver disease An unusual or allergic reaction to finasteride, other medications, foods, dyes, or preservatives Pregnant or trying to get pregnant Breast-feeding How should I use this medication? Take this medication by mouth with water. Take it as directed on the prescription label at the same time every day. You can take it with or without food. If it upsets your stomach, take it with food. Keep taking it unless your care team tells you to stop. Talk to your care team about the use of this medication in children. Special care may be needed. Overdosage: If you think you have taken too much of this medicine contact a poison control center or emergency room at once. NOTE: This medicine is only for you. Do not share this medicine with others. What if I miss a dose? If you miss a dose, take it as soon as you can. If it is almost time for your next dose, take only that dose. Do not take double or extra doses. What may interact with this medication? Saw palmetto or other dietary supplements This list may not describe all possible interactions. Give your health care provider a list of all the medicines, herbs, non-prescription drugs, or dietary supplements you use. Also tell them if you smoke, drink alcohol, or use illegal drugs. Some items may interact with your medicine. What should I watch for while using this medication? Visit your care team  for regular checks on your progress. It may be some time before you see the benefit from this medication. You may need blood work while taking this medication. For example, your care team may have you take a blood test called PSA for the screening of prostate cancer. Make sure your care team knows you are taking this medication before you take a PSA test. Talk to your care team about your risk of cancer. You may be more at risk for certain types of cancer if you take this medication. Do not donate blood while you are taking this medication. Donated blood may contain enough of this medication to cause birth defects in someone who is pregnant. Ask your care team when it is safe to donate blood after you stop taking this medication. This medication can cause serious birth defects. If you are pregnant or may get pregnant, do not handle broken or crushed tablets of this medication. If you are pregnant and come into contact with broken or crushed tablets, contact your care team. Exposure to whole tablets is not expected to cause harm as long as they are not swallowed. What side effects may I notice from receiving this medication? Side effects that you should report to your care team as soon as possible: Allergic reactions--skin rash, itching, hives, swelling of the face, lips, tongue, or throat Breast tissue changes, new lumps, redness, pain, or discharge from the nipple Side effects that usually do not require medical attention (report to your care team if they continue or are bothersome): Breast pain or  tenderness Change in sex drive or performance This list may not describe all possible side effects. Call your doctor for medical advice about side effects. You may report side effects to FDA at 1-800-FDA-1088. Where should I keep my medication? Keep out of the reach of children and pets. Store at room temperature below 30 degrees C (86 degrees F). Protect from light. Keep the container tightly closed. Get  rid of any unused medication after the expiration date. To get rid of medications that are no longer needed or have expired: Take the medication to a medication take-back program. Check with your pharmacy or law enforcement to find a location. If you cannot return the medication, ask your pharmacist or care team how to get rid of this medication safely. NOTE: This sheet is a summary. It may not cover all possible information. If you have questions about this medicine, talk to your doctor, pharmacist, or health care provider.  2024 Elsevier/Gold Standard (2021-07-14 00:00:00)

## 2023-12-01 ENCOUNTER — Ambulatory Visit: Payer: Self-pay | Admitting: Physician Assistant

## 2023-12-01 ENCOUNTER — Encounter: Payer: Self-pay | Admitting: Physician Assistant

## 2023-12-01 LAB — CMP14+EGFR
ALT: 36 IU/L (ref 0–44)
AST: 32 IU/L (ref 0–40)
Albumin: 4.6 g/dL (ref 3.8–4.9)
Alkaline Phosphatase: 53 IU/L (ref 47–123)
BUN/Creatinine Ratio: 14 (ref 9–20)
BUN: 15 mg/dL (ref 6–24)
Bilirubin Total: 0.4 mg/dL (ref 0.0–1.2)
CO2: 27 mmol/L (ref 20–29)
Calcium: 9 mg/dL (ref 8.7–10.2)
Chloride: 100 mmol/L (ref 96–106)
Creatinine, Ser: 1.09 mg/dL (ref 0.76–1.27)
Globulin, Total: 2.7 g/dL (ref 1.5–4.5)
Glucose: 81 mg/dL (ref 70–99)
Potassium: 4.1 mmol/L (ref 3.5–5.2)
Sodium: 140 mmol/L (ref 134–144)
Total Protein: 7.3 g/dL (ref 6.0–8.5)
eGFR: 81 mL/min/1.73 (ref >59)

## 2023-12-01 LAB — PSA, TOTAL AND FREE
PSA, Free Pct: 20 %
PSA, Free: 0.24 ng/mL
Prostate Specific Ag, Serum: 1.2 ng/mL (ref 0.0–4.0)

## 2023-12-01 NOTE — Progress Notes (Signed)
 Normal PSA.  Great kidney, liver, glucose.

## 2023-12-01 NOTE — Progress Notes (Signed)
 Established Patient Office Visit  Subjective   Patient ID: Jerry Escobar, male    DOB: Jul 01, 1970  Age: 53 y.o. MRN: [id number]  Chief Complaint  Patient presents with   Medical Management of Chronic Issues    HPI Discussed the use of AI scribe software for clinical note transcription with the patient, who gave verbal consent to proceed.  History of Present Illness Jerry Escobar is a 53 year old male with benign prostatic hyperplasia who presents with urinary symptoms and sexual dysfunction.  Lower urinary tract symptoms - Urinary symptoms present for the past three months - Prior to starting medication, urinary frequency was significantly increased - Currently taking Tamsulosin  (Flomax ) and Cialis  daily, resulting in partial symptom relief - No prior evaluation by a urologist  Sexual dysfunction - Decreased ejaculatory volume and reduced erectile firmness over the past few months - Sensation during climax is preserved, but ejaculate volume is diminished - Concern regarding impact on sexual health  Hypertension management - Currently taking metoprolol /norvasc /valsartan /HCT for blood pressure control - Did not take metoprolol , norvasc , valsartan /HCT prior to appointment, resulting in elevated blood pressure readings   ROS See HPI.    Objective:     BP (!) 150/88   Pulse 80   Ht 6' 3 (1.905 m)   Wt 291 lb (132 kg)   SpO2 99%   BMI 36.37 kg/m  BP Readings from Last 3 Encounters:  11/30/23 (!) 150/88  11/05/23 (!) 148/84  10/25/23 (!) 142/92   Wt Readings from Last 3 Encounters:  11/30/23 291 lb (132 kg)  10/25/23 291 lb (132 kg)  10/14/23 286 lb (129.7 kg)      Physical Exam Constitutional:      Appearance: Normal appearance.  HENT:     Head: Normocephalic.  Cardiovascular:     Rate and Rhythm: Normal rate.  Pulmonary:     Effort: Pulmonary effort is normal.  Musculoskeletal:     Right lower leg: No edema.     Left lower leg: No edema.   Neurological:     General: No focal deficit present.     Mental Status: He is alert and oriented to person, place, and time.  Psychiatric:        Mood and Affect: Mood normal.    ..  RO-AUA SYMPTOM     Row Name 11/30/23 1500         During the last Month   Sensation of Bladder not Empty Not at all     Urinate<2 hours after last Less than half the time     Mult. stop/start when voiding Less than 1 time in 5     Difficult to postpone voiding Less than 1 time in 5     Weak urinary stream Less than half the time     Push/strain to begin urination Less than half the time     Times per night up to urinate Not at all       OTHER   Total Score 8            Assessment & Plan:  SABRASABRADwayne was seen today for medical management of chronic issues.  Diagnoses and all orders for this visit:  Benign prostatic hyperplasia without lower urinary tract symptoms -     PSA, total and free -     CMP14+EGFR -     finasteride (PROPECIA) 1 MG tablet; Take 1 tablet (1 mg total) by mouth daily. -     Ambulatory referral  to Urology  Medication management -     PSA, total and free -     CMP14+EGFR  Erectile dysfunction, unspecified erectile dysfunction type -     Ambulatory referral to Urology   Assessment & Plan Benign prostatic hyperplasia with lower urinary tract symptoms Moderate urinary symptoms for three months. Tamsulosin  and daily Cialis  beneficial. PSA testing necessary to rule out abnormal prostate enlargement. Discussed potential side effects of additional medication. AUA 8- moderate dysfunction.  - Ordered PSA test. - Referred to urology for further evaluation and management. - Prescribed additional medication, finasteride, to see if could help decrease prostate size and improve symptoms.   Erectile dysfunction Reduced ejaculatory volume and difficulty maintaining erection. Daily Cialis  may help. Metoprolol  may contribute but symptoms predate its use. - Referred to urology  for further evaluation and management.  Hypertension Blood pressure elevated, possibly due to missed medications today. Management crucial to prevent complications. Start keeping log and follow up for BP check in 4 weeks.    Return if symptoms worsen or fail to improve.    Shahida Schnackenberg, PA-C

## 2023-12-24 ENCOUNTER — Ambulatory Visit: Admitting: Urology

## 2023-12-24 ENCOUNTER — Encounter: Payer: Self-pay | Admitting: Urology

## 2023-12-24 VITALS — BP 147/83 | HR 78 | Ht 75.0 in | Wt 286.0 lb

## 2023-12-24 DIAGNOSIS — N401 Enlarged prostate with lower urinary tract symptoms: Secondary | ICD-10-CM

## 2023-12-24 DIAGNOSIS — R3912 Poor urinary stream: Secondary | ICD-10-CM | POA: Diagnosis not present

## 2023-12-24 DIAGNOSIS — N529 Male erectile dysfunction, unspecified: Secondary | ICD-10-CM | POA: Diagnosis not present

## 2023-12-24 DIAGNOSIS — R350 Frequency of micturition: Secondary | ICD-10-CM

## 2023-12-24 DIAGNOSIS — R3915 Urgency of urination: Secondary | ICD-10-CM

## 2023-12-24 DIAGNOSIS — N138 Other obstructive and reflux uropathy: Secondary | ICD-10-CM

## 2023-12-24 LAB — URINALYSIS, ROUTINE W REFLEX MICROSCOPIC
Bilirubin, UA: NEGATIVE
Glucose, UA: NEGATIVE
Ketones, UA: NEGATIVE
Leukocytes,UA: NEGATIVE
Nitrite, UA: NEGATIVE
Protein,UA: NEGATIVE
RBC, UA: NEGATIVE
Specific Gravity, UA: 1.02 (ref 1.005–1.030)
Urobilinogen, Ur: 1 mg/dL (ref 0.2–1.0)
pH, UA: 7 (ref 5.0–7.5)

## 2023-12-24 MED ORDER — ALFUZOSIN HCL ER 10 MG PO TB24: 10.0000 mg | ORAL_TABLET | Freq: Every day | ORAL | 11 refills | Status: DC

## 2023-12-24 NOTE — Progress Notes (Signed)
 Assessment: 1. BPH with obstruction/lower urinary tract symptoms   2. Organic impotence     Plan: I personally reviewed the patient's chart including provider notes, lab results. Recommend trial of alfuzosin  10 mg daily in place of tamsulosin  due to potential ejaculatory side effects of tamsulosin . Prescription for alfuzosin  10 mg daily sent to his pharmacy. Discontinue finasteride  due to potential ejaculatory side effects. Continue tadalafil  5 mg daily. Return to office in 6 weeks.  Chief Complaint:  Chief Complaint  Patient presents with   Benign Prostatic Hypertrophy    History of Present Illness:  Jerry Escobar is a 53 y.o. male who is seen in consultation from Jade Breeback, PA-C for evaluation of erectile dysfunction and BPH with LUTS. He has a history of lower urinary tract symptoms for 1 year with gradually worsening over the past few months.  He has been on tamsulosin  for 6-7 months with some improvement in his symptoms.  He reports decreased nocturia and a slight improvement in his urine stream.  He continues to report a weak stream with some frequency and urgency.  No dysuria or gross hematuria. Finasteride  1 mg daily was added in 11/25. IPSS = 11/2.  He also reports intermittent erectile dysfunction with decreased rigidity.  He also reports some difficulty with ejaculation.  No pain or curvature with erections.  He has been on tadalafil  5 mg daily for approximately 2 years.  The difficulty with ejaculation has been more noticeable in the past few months. He is on a beta-blocker.  He also has risk factors with hypertension and hyperlipidemia.  PSA 11/25:  1.2  Past Medical History:  Past Medical History:  Diagnosis Date   Arthritis    Fatty liver    GERD (gastroesophageal reflux disease)    Been noticeable as of late   Hyperlipidemia    Hypertension    Joint pain    Lactose intolerance    Seasonal allergies    Transaminitis     Past Surgical History:   Past Surgical History:  Procedure Laterality Date   BICEPT TENODESIS Left 10/06/2018   Procedure: BICEPS TENODESIS;  Surgeon: Cristy Bonner DASEN, MD;  Location: [facility];  Service: Orthopedics;  Laterality: Left;   HERNIA REPAIR     KNEE ARTHROSCOPY Bilateral    ROTATOR CUFF REPAIR Bilateral    SHOULDER ARTHROSCOPY WITH ROTATOR CUFF REPAIR AND SUBACROMIAL DECOMPRESSION Left 10/06/2018   Procedure: LEFT SHOULDER ARTHROSCOPY WITH ROTATOR CUFF REPAIR AND SUBACROMIAL DECOMPRESSION, EXTENSIVE DEBRIDEMENT, BICEP TENODESIS;  Surgeon: Cristy Bonner DASEN, MD;  Location: [facility];  Service: Orthopedics;  Laterality: Left;  BLOCK    Allergies:  Allergies  Allergen Reactions   Ace Inhibitors Cough    Family History:  Family History  Problem Relation Age of Onset   Obesity Mother    Obesity Father    Hypertension Father    Diabetes Father    Prostate cancer Father    Diverticulitis Father    Arthritis Father    Breast cancer Paternal Grandmother    Diabetes Brother    Colon cancer Neg Hx    Esophageal cancer Neg Hx    Pancreatic cancer Neg Hx    Stomach cancer Neg Hx    Liver disease Neg Hx    Rectal cancer Neg Hx     Social History:  Social History   Tobacco Use   Smoking status: Unknown   Smokeless tobacco: Never   Tobacco comments:    Smokes cigars socially.  Vaping Use   Vaping status: Never Used  Substance Use Topics   Alcohol use: Yes    Alcohol/week: 3.0 standard drinks of alcohol    Types: 3 Standard drinks or equivalent per week    Comment: Occasional   Drug use: No    Review of symptoms:  Constitutional:  Negative for unexplained weight loss, night sweats, fever, chills ENT:  Negative for nose bleeds, sinus pain, painful swallowing CV:  Negative for chest pain, shortness of breath, exercise intolerance, palpitations, loss of consciousness Resp:  Negative for cough, wheezing, shortness of breath GI:  Negative for nausea, vomiting,  diarrhea, bloody stools GU:  Positives noted in HPI; otherwise negative for gross hematuria, dysuria, urinary incontinence Neuro:  Negative for seizures, poor balance, limb weakness, slurred speech Psych:  Negative for lack of energy, depression, anxiety Endocrine:  Negative for polydipsia, polyuria, symptoms of hypoglycemia (dizziness, hunger, sweating) Hematologic:  Negative for anemia, purpura, petechia, prolonged or excessive bleeding, use of anticoagulants  Allergic:  Negative for difficulty breathing or choking as a result of exposure to anything; no shellfish allergy; no allergic response (rash/itch) to materials, foods  Physical exam: BP (!) 147/83   Pulse 78   Ht 6' 3 (1.905 m)   Wt 286 lb (129.7 kg)   BMI 35.75 kg/m  GENERAL APPEARANCE:  Well appearing, well developed, well nourished, NAD HEENT: Atraumatic, Normocephalic, oropharynx clear. NECK: Supple without lymphadenopathy or thyromegaly. LUNGS: Clear to auscultation bilaterally. HEART: Regular Rate and Rhythm without murmurs, gallops, or rubs. ABDOMEN: Soft, non-tender, No Masses. EXTREMITIES: Moves all extremities well.  Without clubbing, cyanosis, or edema. NEUROLOGIC:  Alert and oriented x 3, normal gait, CN II-XII grossly intact.  MENTAL STATUS:  Appropriate. BACK:  Non-tender to palpation.  No CVAT SKIN:  Warm, dry and intact.   GU: Penis:  circumcised Meatus: Normal Scrotum: normal, no masses Testis: normal without masses bilaterally Prostate: difficult to feel base due to body habitus; NT, no nodules Rectum: Normal tone,  no masses or tenderness   Results: U/A: Negative

## 2024-01-18 ENCOUNTER — Ambulatory Visit
Admission: EM | Admit: 2024-01-18 | Discharge: 2024-01-18 | Disposition: A | Discharge status: Home / Self Care | Attending: Emergency Medicine | Admitting: Emergency Medicine

## 2024-01-18 ENCOUNTER — Encounter: Payer: Self-pay | Admitting: Emergency Medicine

## 2024-01-18 DIAGNOSIS — J0100 Acute maxillary sinusitis, unspecified: Secondary | ICD-10-CM

## 2024-01-18 DIAGNOSIS — R0981 Nasal congestion: Secondary | ICD-10-CM

## 2024-01-18 DIAGNOSIS — J069 Acute upper respiratory infection, unspecified: Secondary | ICD-10-CM

## 2024-01-18 DIAGNOSIS — F411 Generalized anxiety disorder: Secondary | ICD-10-CM | POA: Diagnosis not present

## 2024-01-18 LAB — POC SOFIA SARS ANTIGEN FIA: SARS Coronavirus 2 Ag: NEGATIVE

## 2024-01-18 LAB — POCT INFLUENZA A/B
Influenza A, POC: NEGATIVE
Influenza B, POC: NEGATIVE

## 2024-01-18 MED ORDER — IPRATROPIUM BROMIDE 0.06 % NA SOLN: 2.0000 | Freq: Three times a day (TID) | NASAL | 1 refills | Status: AC

## 2024-01-18 MED ORDER — GUAIFENESIN 400 MG PO TABS: ORAL_TABLET | ORAL | 0 refills | Status: AC

## 2024-01-18 MED ORDER — PROMETHAZINE-DM 6.25-15 MG/5ML PO SYRP: 5.0000 mL | ORAL_SOLUTION | Freq: Every evening | ORAL | 0 refills | Status: AC | PRN

## 2024-01-18 MED ORDER — METHYLPREDNISOLONE 8 MG PO TABS: 8.0000 mg | ORAL_TABLET | Freq: Every day | ORAL | 0 refills | Status: AC

## 2024-01-18 MED ORDER — FLUTICASONE PROPIONATE 50 MCG/ACT NA SUSP: 1.0000 | Freq: Every day | NASAL | 2 refills | Status: AC

## 2024-01-18 NOTE — Discharge Instructions (Addendum)
 Your rapid influenza and COVID-19 antigen tests today were negative.  No further viral testing is indicated     Conservative care is recommended with rest, drinking plenty of clear fluids, eating only when hungry, taking supportive medications for your symptoms and avoiding being around other people until you are feeling better.     Please read below to learn more about the medications, dosages and frequencies that I recommend to help alleviate your symptoms and to get you feeling better soon:  Medrol (methylprednisolone): This is a steroid that will significantly calm your upper and lower airways.  Please take one tablet daily with food until the prescription is complete.      Flonase  (fluticasone ): This is a steroid nasal spray that used once daily, 1 spray in each nare.  This works best when used on a daily basis. This medication does not work well if it is only used when you think you need it.  After 3 to 5 days of use, you will notice significant reduction of the inflammation and mucus production that is currently being caused by exposure to allergens, whether seasonal or environmental.  The most common side effect of this medication is nosebleeds.  If you experience a nosebleed, please discontinue use for 1 week, then feel free to resume.  If you find that your insurance will not pay for this medication, please consider a different nasal steroids such as Nasonex (mometasone), or Nasacort  (triamcinolone ).       Atrovent  (ipratropium): This is an excellent nasal decongestant spray that will not cause rebound congestion.  Please instill 2 sprays into each nare after using the nasal steroid and repeat 2 more times throughout the day.  I have added this nasal spray to the nasal steroid because nasal steroids can take several days to reach full effect.  Once you find that you are forgetting to use the spray more often than you remember to use it, you will know that you no longer need it.     Robitussin,  Mucinex (guaifenesin): This is a daytime expectorant.  This single symptom reliever helps break up chest congestion and loosen up thick nasal drainage making phlegm and drainage easier to cough up and to blow out from your nose.  I recommend taking 400 mg in either liquid or tablet form three times daily as needed.  I do not recommend the 12-hour extended relief version or doses higher than 400 mg per each dose as these often make some patients feel jittery or jumpy and can interfere with sleep.  I also do not recommend that you purchase guaifenesin with the ingredient  DM which is dextromethorphan, a cough suppressant which I only recommend taking at bedtime.  Guaifenesin 400 mg is a safe dose for people who are being treated for high blood pressure.     Promethazine  DM: Promethazine  is both a nasal decongestant that dries up mucous membranes and an antinausea medication.  Promethazine  often makes most patients feel fairly sleepy.  DM is dextromethorphan, a single symptom reliever which is a cough suppressant found in many over-the-counter cough medications and combination cold preparations.  Please take 5 mL before bedtime to minimize your cough which will help you sleep better.  I have sent a prescription for this medication to your pharmacy because it cannot be purchased over-the-counter.   If symptoms have not meaningfully improved in the next 3 to 5 days, please return for repeat evaluation or follow-up with your regular provider.  If symptoms have  worsened in the next 3 to 5 days, please go to the emergency room for further evaluation.    Thank you for visiting urgent care today.  We appreciate the opportunity to participate in your care.

## 2024-01-18 NOTE — ED Triage Notes (Signed)
 Pt reports a cough x 4 days, post-nasal drainage x 1 day and sore throat x 2 days. Has tried Theraflu, Alka Seltzer and Mucinex with some relief.

## 2024-01-18 NOTE — ED Provider Notes (Signed)
 "    Jerry Escobar    CSN: [id number] Arrival date & time: 01/18/24  0803    HISTORY   Chief Complaint  Patient presents with   Cough   post nasal drainage    Sore Throat   HPI Jerry Escobar is a pleasant, 53 y.o. male who presents to urgent care today. Pt reports a cough x 4 days, post-nasal drainage x 1 day and sore throat x 2 days. Has tried Theraflu, Alka Seltzer and Mucinex with some relief.  Denies fever, body aches, chills, nausea, vomiting, diarrhea.  Patient denies history of smoking, allergies and asthma.  EMR reviewed by me, patient has been prescribed Flonase  in the past, states he is not currently using it.   The history is provided by the patient.  Cough Sore Throat   Past Medical History:  Diagnosis Date   Arthritis    Fatty liver    GERD (gastroesophageal reflux disease)    Been noticeable as of late   Hyperlipidemia    Hypertension    Joint pain    Lactose intolerance    Seasonal allergies    Transaminitis    Patient Active Problem List   Diagnosis Date Noted   BPH with obstruction/lower urinary tract symptoms 10/26/2023   Bilateral impacted cerumen 10/25/2023   Right calf pain 08/02/2023   Vitamin D  deficiency 01/27/2023   Insulin  resistance 01/27/2023   Shift work sleep disorder 08/06/2022   Failure to attend appointment 02/18/2022   Chronic pain syndrome 02/17/2022   Infraspinatus tendon tear, sequela (Left) 02/17/2022   Osteoarthritis of glenohumeral joint (Left) 02/17/2022   osteoarthritis of shoulder (Left) 02/17/2022   Lumbosacral facet arthropathy 02/17/2022   hand pain (Left) 08/05/2021   Primary osteoarthritis of both knees 04/03/2020   Positive colorectal cancer screening using Cologuard test 02/27/2020   Carpal tunnel syndrome (Bilateral) 05/17/2019   Obstructive sleep apnea 02/03/2019   Organic impotence 12/26/2018   DDD (degenerative disc disease), lumbosacral 11/24/2016   Right-sided chest wall subcutaneous mass 07/01/2016    Hyperlipidemia 01/16/2014   Transaminitis 01/16/2014   Prediabetes 01/16/2014   Annual physical exam 01/15/2014   Essential hypertension, benign 01/15/2014   Obesity 01/15/2014   Osteoarthritis of hip (Left) 09/18/2013   Past Surgical History:  Procedure Laterality Date   BICEPT TENODESIS Left 10/06/2018   Procedure: BICEPS TENODESIS;  Surgeon: Cristy Bonner DASEN, MD;  Location: [facility];  Service: Orthopedics;  Laterality: Left;   HERNIA REPAIR     KNEE ARTHROSCOPY Bilateral    ROTATOR CUFF REPAIR Bilateral    SHOULDER ARTHROSCOPY WITH ROTATOR CUFF REPAIR AND SUBACROMIAL DECOMPRESSION Left 10/06/2018   Procedure: LEFT SHOULDER ARTHROSCOPY WITH ROTATOR CUFF REPAIR AND SUBACROMIAL DECOMPRESSION, EXTENSIVE DEBRIDEMENT, BICEP TENODESIS;  Surgeon: Cristy Bonner DASEN, MD;  Location: [facility];  Service: Orthopedics;  Laterality: Left;  BLOCK    Home Medications    Prior to Admission medications  Medication Sig Start Date End Date Taking? Authorizing Provider  acetaminophen  (TYLENOL ) 650 MG CR tablet Take 1 tablet (650 mg total) by mouth every 8 (eight) hours as needed for pain. 04/03/20   Curtis Debby PARAS, MD  alfuzosin  (UROXATRAL ) 10 MG 24 hr tablet Take 1 tablet (10 mg total) by mouth daily with breakfast. 12/24/23   Stoneking, Adine PARAS., MD  amLODipine  (NORVASC ) 10 MG tablet Take 1 tablet (10 mg total) by mouth daily. 10/25/23   Breeback, Jade L, PA-C  celecoxib  (CELEBREX ) 200 MG capsule One to 2  tablets by mouth daily as needed for pain. 08/30/23   Thekkekandam, Thomas J, MD  fluticasone  (FLONASE ) 50 MCG/ACT nasal spray Place 1 spray into both nostrils daily. 08/02/23   Curtis Debby PARAS, MD  melatonin 5 MG TABS Take with Ambien  1 hour before you want to be sleeping 08/06/22   Curtis Debby PARAS, MD  metoprolol  succinate (TOPROL -XL) 25 MG 24 hr tablet Take 1 tablet (25 mg total) by mouth daily. 10/25/23   Breeback, Jade L, PA-C  tadalafil  (CIALIS ) 5 MG  tablet TAKE 1 TABLET BY MOUTH DAILY 01/07/23   Curtis Debby PARAS, MD  valsartan -hydrochlorothiazide  (DIOVAN -HCT) 320-25 MG tablet Take 1 tablet by mouth daily. 10/25/23   Breeback, Vermell CROME, PA-C  zolpidem  (AMBIEN ) 10 MG tablet TAKE 1 TABLET BU MOUTH 1 HOUR BEFORE YOU WANT TO BE ASLEEP 10/25/23   Antoniette Vermell CROME, PA-C    Family History Family History  Problem Relation Age of Onset   Obesity Mother    Obesity Father    Hypertension Father    Diabetes Father    Prostate cancer Father    Diverticulitis Father    Arthritis Father    Breast cancer Paternal Grandmother    Diabetes Brother    Colon cancer Neg Hx    Esophageal cancer Neg Hx    Pancreatic cancer Neg Hx    Stomach cancer Neg Hx    Liver disease Neg Hx    Rectal cancer Neg Hx    Social History Social History[1] Allergies   Ace inhibitors  Review of Systems Review of Systems  Respiratory:  Positive for cough.    Pertinent findings revealed after performing a 14 point review of systems has been noted in the history of present illness.  Physical Exam Vital Signs BP 131/79 (BP Location: Right Arm)   Pulse 81   Temp 97.9 F (36.6 C) (Oral)   Resp 18   SpO2 95%   No data found.  Physical Exam Vitals and nursing note reviewed.  Constitutional:      Appearance: He is not ill-appearing.  HENT:     Head: Normocephalic and atraumatic.     Salivary Glands: Right salivary gland is not diffusely enlarged or tender. Left salivary gland is not diffusely enlarged or tender.     Right Ear: Hearing, tympanic membrane and external ear normal.     Left Ear: Hearing, tympanic membrane and external ear normal.     Ears:     Comments: Bilateral EACs with erythema    Nose: Congestion and rhinorrhea present. Rhinorrhea is clear.     Right Sinus: No maxillary sinus tenderness or frontal sinus tenderness.     Left Sinus: No maxillary sinus tenderness or frontal sinus tenderness.     Mouth/Throat:     Lips: Pink.     Mouth:  Mucous membranes are moist.     Pharynx: Pharyngeal swelling, posterior oropharyngeal erythema and uvula swelling present.     Tonsils: No tonsillar exudate. 0 on the right. 0 on the left.  Cardiovascular:     Rate and Rhythm: Normal rate and regular rhythm.     Pulses: Normal pulses.     Heart sounds: Normal heart sounds.  Pulmonary:     Effort: Pulmonary effort is normal. No accessory muscle usage, prolonged expiration or respiratory distress.     Breath sounds: Normal breath sounds and air entry. No wheezing, rhonchi or rales.  Abdominal:     General: Abdomen is flat. Bowel sounds are normal.  Palpations: Abdomen is soft.  Musculoskeletal:        General: Normal range of motion.     Cervical back: Full passive range of motion without pain, normal range of motion and neck supple.  Lymphadenopathy:     Cervical: No cervical adenopathy.  Skin:    General: Skin is warm and dry.  Neurological:     General: No focal deficit present.     Mental Status: He is alert and oriented to person, place, and time.     Motor: Motor function is intact.     Coordination: Coordination is intact.     Gait: Gait is intact.     Deep Tendon Reflexes: Reflexes are normal and symmetric.  Psychiatric:        Attention and Perception: Attention and perception normal.        Mood and Affect: Mood and affect normal.        Speech: Speech normal.        Behavior: Behavior normal. Behavior is cooperative.        Thought Content: Thought content normal.     Visual Acuity Right Eye Distance:   Left Eye Distance:   Bilateral Distance:    Right Eye Near:   Left Eye Near:    Bilateral Near:     [facility] / Diagnostics / Procedures:     Radiology No results found.  Procedures Procedures (including critical care time) EKG  Pending results:  Labs Reviewed  POCT INFLUENZA A/B  POC SOFIA SARS ANTIGEN FIA    Medications Ordered in [facility]: Medications - No data to display  [facility] Diagnoses / Final  Clinical Impressions(s)   I have reviewed the triage vital signs and the nursing notes.  Pertinent labs & imaging results that were available during my care of the patient were reviewed by me and considered in my medical decision making (see chart for details).    Final diagnoses:  Viral URI with cough   COVID-19 and influenza test were both negative.  Patient advised.  No further testing indicated due to duration of symptoms.  Patient provided with supportive medications.  Conservative care recommended.  Return precautions advised.  Please see discharge instructions below for details of plan of care as provided to patient. ED Prescriptions     Medication Sig Dispense Auth. Provider   ipratropium (ATROVENT ) 0.06 % nasal spray Place 2 sprays into both nostrils 3 (three) times daily. As needed for nasal congestion, runny nose 15 mL Joesph Shaver Scales, PA-C   fluticasone  (FLONASE ) 50 MCG/ACT nasal spray Place 1 spray into both nostrils daily. Begin by using 2 sprays in each nare daily for 3 to 5 days, then decrease to 1 spray in each nare daily. 15.8 mL Joesph Shaver Scales, PA-C   methylPREDNISolone (MEDROL) 8 MG tablet Take 1 tablet (8 mg total) by mouth daily for 3 days. 3 tablet Joesph Shaver Scales, PA-C   guaifenesin (HUMIBID E) 400 MG TABS tablet Take 1 tablet 3 times daily as needed for chest congestion and cough 30 tablet Joesph Shaver Scales, PA-C   promethazine -dextromethorphan (PROMETHAZINE -DM) 6.25-15 MG/5ML syrup Take 5 mLs by mouth at bedtime as needed for cough. 60 mL Joesph Shaver Scales, PA-C      PDMP not reviewed this encounter.    Discharge Instructions      Your rapid influenza and COVID-19 antigen tests today were negative.  No further viral testing is indicated     Conservative care is recommended with rest,  drinking plenty of clear fluids, eating only when hungry, taking supportive medications for your symptoms and avoiding being around other people  until you are feeling better.     Please read below to learn more about the medications, dosages and frequencies that I recommend to help alleviate your symptoms and to get you feeling better soon:  Medrol (methylprednisolone): This is a steroid that will significantly calm your upper and lower airways.  Please take one tablet daily with food until the prescription is complete.      Flonase  (fluticasone ): This is a steroid nasal spray that used once daily, 1 spray in each nare.  This works best when used on a daily basis. This medication does not work well if it is only used when you think you need it.  After 3 to 5 days of use, you will notice significant reduction of the inflammation and mucus production that is currently being caused by exposure to allergens, whether seasonal or environmental.  The most common side effect of this medication is nosebleeds.  If you experience a nosebleed, please discontinue use for 1 week, then feel free to resume.  If you find that your insurance will not pay for this medication, please consider a different nasal steroids such as Nasonex (mometasone), or Nasacort  (triamcinolone ).       Atrovent  (ipratropium): This is an excellent nasal decongestant spray that will not cause rebound congestion.  Please instill 2 sprays into each nare after using the nasal steroid and repeat 2 more times throughout the day.  I have added this nasal spray to the nasal steroid because nasal steroids can take several days to reach full effect.  Once you find that you are forgetting to use the spray more often than you remember to use it, you will know that you no longer need it.     Robitussin, Mucinex (guaifenesin): This is a daytime expectorant.  This single symptom reliever helps break up chest congestion and loosen up thick nasal drainage making phlegm and drainage easier to cough up and to blow out from your nose.  I recommend taking 400 mg in either liquid or tablet form three times  daily as needed.  I do not recommend the 12-hour extended relief version or doses higher than 400 mg per each dose as these often make some patients feel jittery or jumpy and can interfere with sleep.  I also do not recommend that you purchase guaifenesin with the ingredient  DM which is dextromethorphan, a cough suppressant which I only recommend taking at bedtime.  Guaifenesin 400 mg is a safe dose for people who are being treated for high blood pressure.     Promethazine  DM: Promethazine  is both a nasal decongestant that dries up mucous membranes and an antinausea medication.  Promethazine  often makes most patients feel fairly sleepy.  DM is dextromethorphan, a single symptom reliever which is a cough suppressant found in many over-the-counter cough medications and combination cold preparations.  Please take 5 mL before bedtime to minimize your cough which will help you sleep better.  I have sent a prescription for this medication to your pharmacy because it cannot be purchased over-the-counter.   If symptoms have not meaningfully improved in the next 3 to 5 days, please return for repeat evaluation or follow-up with your regular provider.  If symptoms have worsened in the next 3 to 5 days, please go to the emergency room for further evaluation.    Thank you for visiting urgent care  today.  We appreciate the opportunity to participate in your care.       Disposition Upon Discharge:  Condition: stable for discharge home  Patient presented with an acute illness with associated systemic symptoms and significant discomfort requiring urgent management. In my opinion, this is a condition that a prudent lay person (someone who possesses an average knowledge of health and medicine) may potentially expect to result in complications if not addressed urgently such as respiratory distress, impairment of bodily function or dysfunction of bodily organs.   Routine symptom specific, illness specific and/or  disease specific instructions were discussed with the patient and/or caregiver at length.   As such, the patient has been evaluated and assessed, work-up was performed and treatment was provided in alignment with urgent care protocols and evidence based medicine.  Patient/parent/caregiver has been advised that the patient may require follow up for further testing and treatment if the symptoms continue in spite of treatment, as clinically indicated and appropriate.  Patient/parent/caregiver has been advised to return to the [facility] or PCP if no better; to PCP or the Emergency Department if new signs and symptoms develop, or if the current signs or symptoms continue to change or worsen for further workup, evaluation and treatment as clinically indicated and appropriate  The patient will follow up with their current PCP if and as advised. If the patient does not currently have a PCP we will assist them in obtaining one.   The patient may need specialty follow up if the symptoms continue, in spite of conservative treatment and management, for further workup, evaluation, consultation and treatment as clinically indicated and appropriate.  Patient/parent/caregiver verbalized understanding and agreement of plan as discussed.  All questions were addressed during visit.  Please see discharge instructions below for further details of plan.  This office note has been dictated using Teaching laboratory technician.  Unfortunately, this method of dictation can sometimes lead to typographical or grammatical errors.  I apologize for your inconvenience in advance if this occurs.  Please do not hesitate to reach out to me if clarification is needed.       [1]  Social History Tobacco Use   Smoking status: Unknown   Smokeless tobacco: Never   Tobacco comments:    Smokes cigars socially.   Vaping Use   Vaping status: Never Used  Substance Use Topics   Alcohol use: Yes    Alcohol/week: 3.0 standard drinks of  alcohol    Types: 3 Standard drinks or equivalent per week    Comment: Occasional   Drug use: No     Joesph Shaver [place], PA-C 01/18/24 [id number]  "

## 2024-02-09 ENCOUNTER — Encounter: Payer: Self-pay | Admitting: Urology

## 2024-02-09 ENCOUNTER — Ambulatory Visit: Admitting: Urology

## 2024-02-09 VITALS — BP 111/75 | HR 64 | Ht 75.0 in | Wt 277.0 lb

## 2024-02-09 DIAGNOSIS — N401 Enlarged prostate with lower urinary tract symptoms: Secondary | ICD-10-CM | POA: Diagnosis not present

## 2024-02-09 DIAGNOSIS — N138 Other obstructive and reflux uropathy: Secondary | ICD-10-CM | POA: Diagnosis not present

## 2024-02-09 DIAGNOSIS — N529 Male erectile dysfunction, unspecified: Secondary | ICD-10-CM

## 2024-02-09 LAB — URINALYSIS, ROUTINE W REFLEX MICROSCOPIC
Bilirubin, UA: NEGATIVE
Glucose, UA: NEGATIVE
Ketones, UA: NEGATIVE
Leukocytes,UA: NEGATIVE
Nitrite, UA: NEGATIVE
Protein,UA: NEGATIVE
RBC, UA: NEGATIVE
Specific Gravity, UA: 1.025 (ref 1.005–1.030)
Urobilinogen, Ur: 1 mg/dL (ref 0.2–1.0)
pH, UA: 5.5 (ref 5.0–7.5)

## 2024-02-09 MED ORDER — TADALAFIL 5 MG PO TABS: 5.0000 mg | ORAL_TABLET | Freq: Every day | ORAL | 3 refills | Status: AC

## 2024-02-09 MED ORDER — ALFUZOSIN HCL ER 10 MG PO TB24: 10.0000 mg | ORAL_TABLET | Freq: Every day | ORAL | 3 refills | Status: AC

## 2024-02-09 NOTE — Progress Notes (Signed)
 "  Assessment: 1. BPH with obstruction/lower urinary tract symptoms   2. Organic impotence     Plan: Continue alfuzosin  10 mg daily. Continue tadalafil  5 mg daily. Return to office in 6 months  Chief Complaint:  Chief Complaint  Patient presents with   Benign Prostatic Hypertrophy    History of Present Illness:  Jerry Escobar is a 54 y.o. male who is seen for further evaluation of erectile dysfunction and BPH with LUTS. At his initial visit in December 2025, he reported a history of lower urinary tract symptoms for 1 year with gradually worsening over the past few months.  He had been on tamsulosin  for 6-7 months with some improvement in his symptoms.  He reported decreased nocturia and a slight improvement in his urine stream.  He continued to report a weak stream with some frequency and urgency.  No dysuria or gross hematuria. Finasteride  1 mg daily was added in 11/25. IPSS = 11/2.  He also reported intermittent erectile dysfunction with decreased rigidity.  He noted some difficulty with ejaculation.  No pain or curvature with erections.  He had been on tadalafil  5 mg daily for approximately 2 years.  The difficulty with ejaculation had been more noticeable in the past few months. He is on a beta-blocker.  He also has risk factors with hypertension and hyperlipidemia.  PSA 11/25:  1.2  He was changed to alfuzosin  10 mg daily.  The finasteride  was discontinued due to potential ejaculatory side effects.  He was continued on tadalafil  5 mg daily.  He returns today for follow-up.  He continues on alfuzosin  10 mg daily and tadalafil  5 mg daily.  He reports improvement in his urinary symptoms.  He also has seen some improvement in his ejaculation.  No side effects from the medication.  No dysuria or gross hematuria. IPSS = 6/1.  Portions of the above documentation were copied from a prior visit for review purposes only.   Past Medical History:  Past Medical History:  Diagnosis Date    Arthritis    Fatty liver    GERD (gastroesophageal reflux disease)    Been noticeable as of late   Hyperlipidemia    Hypertension    Joint pain    Lactose intolerance    Seasonal allergies    Transaminitis     Past Surgical History:  Past Surgical History:  Procedure Laterality Date   BICEPT TENODESIS Left 10/06/2018   Procedure: BICEPS TENODESIS;  Surgeon: Cristy Bonner DASEN, MD;  Location: [facility];  Service: Orthopedics;  Laterality: Left;   HERNIA REPAIR     KNEE ARTHROSCOPY Bilateral    ROTATOR CUFF REPAIR Bilateral    SHOULDER ARTHROSCOPY WITH ROTATOR CUFF REPAIR AND SUBACROMIAL DECOMPRESSION Left 10/06/2018   Procedure: LEFT SHOULDER ARTHROSCOPY WITH ROTATOR CUFF REPAIR AND SUBACROMIAL DECOMPRESSION, EXTENSIVE DEBRIDEMENT, BICEP TENODESIS;  Surgeon: Cristy Bonner DASEN, MD;  Location: [facility];  Service: Orthopedics;  Laterality: Left;  BLOCK    Allergies:  Allergies  Allergen Reactions   Ace Inhibitors Cough    Family History:  Family History  Problem Relation Age of Onset   Obesity Mother    Obesity Father    Hypertension Father    Diabetes Father    Prostate cancer Father    Diverticulitis Father    Arthritis Father    Breast cancer Paternal Grandmother    Diabetes Brother    Colon cancer Neg Hx    Esophageal cancer Neg Hx  Pancreatic cancer Neg Hx    Stomach cancer Neg Hx    Liver disease Neg Hx    Rectal cancer Neg Hx     Social History:  Social History   Tobacco Use   Smoking status: Unknown   Smokeless tobacco: Never   Tobacco comments:    Smokes cigars socially.   Vaping Use   Vaping status: Never Used  Substance Use Topics   Alcohol use: Yes    Alcohol/week: 3.0 standard drinks of alcohol    Types: 3 Standard drinks or equivalent per week    Comment: Occasional   Drug use: No    ROS: Constitutional:  Negative for fever, chills, weight loss CV: Negative for chest pain, previous MI,  hypertension Respiratory:  Negative for shortness of breath, wheezing, sleep apnea, frequent cough GI:  Negative for nausea, vomiting, bloody stool, GERD  Physical exam: BP 111/75   Pulse 64   Ht 6' 3 (1.905 m)   Wt 277 lb (125.6 kg)   BMI 34.62 kg/m  GENERAL APPEARANCE:  Well appearing, well developed, well nourished, NAD HEENT:  Atraumatic, normocephalic, oropharynx clear NECK:  Supple without lymphadenopathy or thyromegaly ABDOMEN:  Soft, non-tender, no masses EXTREMITIES:  Moves all extremities well, without clubbing, cyanosis, or edema NEUROLOGIC:  Alert and oriented x 3, normal gait, CN II-XII grossly intact MENTAL STATUS:  appropriate BACK:  Non-tender to palpation, No CVAT SKIN:  Warm, dry, and intact   Results: U/A: negative "

## 2024-04-24 ENCOUNTER — Ambulatory Visit: Admitting: Physician Assistant

## 2024-08-16 ENCOUNTER — Ambulatory Visit: Admitting: Urology
